# Patient Record
Sex: Male | Born: 1955 | Race: White | Hispanic: No | Marital: Married | Smoking: Former smoker
Health system: Southern US, Community
[De-identification: ages and names within clinical notes are randomized; demographics above are authoritative.]

## PROBLEM LIST (undated history)

## (undated) DIAGNOSIS — E785 Hyperlipidemia, unspecified: Secondary | ICD-10-CM

## (undated) DIAGNOSIS — G4730 Sleep apnea, unspecified: Secondary | ICD-10-CM

## (undated) DIAGNOSIS — F32A Depression, unspecified: Secondary | ICD-10-CM

## (undated) DIAGNOSIS — E039 Hypothyroidism, unspecified: Secondary | ICD-10-CM

## (undated) DIAGNOSIS — R55 Syncope and collapse: Secondary | ICD-10-CM

## (undated) DIAGNOSIS — M1990 Unspecified osteoarthritis, unspecified site: Secondary | ICD-10-CM

## (undated) DIAGNOSIS — R011 Cardiac murmur, unspecified: Secondary | ICD-10-CM

## (undated) DIAGNOSIS — R51 Headache: Secondary | ICD-10-CM

## (undated) DIAGNOSIS — Z8489 Family history of other specified conditions: Secondary | ICD-10-CM

## (undated) DIAGNOSIS — K219 Gastro-esophageal reflux disease without esophagitis: Secondary | ICD-10-CM

## (undated) DIAGNOSIS — E669 Obesity, unspecified: Secondary | ICD-10-CM

## (undated) DIAGNOSIS — R0789 Other chest pain: Secondary | ICD-10-CM

## (undated) DIAGNOSIS — I10 Essential (primary) hypertension: Secondary | ICD-10-CM

## (undated) DIAGNOSIS — F419 Anxiety disorder, unspecified: Secondary | ICD-10-CM

## (undated) DIAGNOSIS — Z9889 Other specified postprocedural states: Secondary | ICD-10-CM

## (undated) DIAGNOSIS — F329 Major depressive disorder, single episode, unspecified: Secondary | ICD-10-CM

## (undated) DIAGNOSIS — Z8711 Personal history of peptic ulcer disease: Secondary | ICD-10-CM

## (undated) DIAGNOSIS — I2510 Atherosclerotic heart disease of native coronary artery without angina pectoris: Secondary | ICD-10-CM

## (undated) DIAGNOSIS — D649 Anemia, unspecified: Secondary | ICD-10-CM

## (undated) DIAGNOSIS — R112 Nausea with vomiting, unspecified: Secondary | ICD-10-CM

## (undated) HISTORY — DX: Depression, unspecified: F32.A

## (undated) HISTORY — DX: Headache: R51

## (undated) HISTORY — PX: VASECTOMY: SHX75

## (undated) HISTORY — DX: Syncope and collapse: R55

## (undated) HISTORY — DX: Hyperlipidemia, unspecified: E78.5

## (undated) HISTORY — DX: Other chest pain: R07.89

## (undated) HISTORY — DX: Personal history of peptic ulcer disease: Z87.11

## (undated) HISTORY — DX: Major depressive disorder, single episode, unspecified: F32.9

## (undated) HISTORY — DX: Obesity, unspecified: E66.9

## (undated) HISTORY — PX: NECK SURGERY: SHX720

## (undated) HISTORY — PX: OTHER SURGICAL HISTORY: SHX169

## (undated) HISTORY — DX: Essential (primary) hypertension: I10

## (undated) HISTORY — PX: MOUTH SURGERY: SHX715

---

## 1998-11-05 ENCOUNTER — Ambulatory Visit (HOSPITAL_COMMUNITY): Admission: RE | Admit: 1998-11-05 | Discharge: 1998-11-05 | Payer: Self-pay | Admitting: Internal Medicine

## 2003-01-30 ENCOUNTER — Encounter: Payer: Self-pay | Admitting: Neurosurgery

## 2003-01-30 ENCOUNTER — Observation Stay (HOSPITAL_COMMUNITY): Admission: RE | Admit: 2003-01-30 | Discharge: 2003-01-31 | Payer: Self-pay | Admitting: Neurosurgery

## 2003-10-16 ENCOUNTER — Ambulatory Visit (HOSPITAL_COMMUNITY): Admission: RE | Admit: 2003-10-16 | Discharge: 2003-10-16 | Payer: Self-pay | Admitting: Internal Medicine

## 2004-04-12 ENCOUNTER — Encounter: Admission: RE | Admit: 2004-04-12 | Discharge: 2004-04-12 | Payer: Self-pay | Admitting: Internal Medicine

## 2004-04-22 ENCOUNTER — Ambulatory Visit (HOSPITAL_BASED_OUTPATIENT_CLINIC_OR_DEPARTMENT_OTHER): Admission: RE | Admit: 2004-04-22 | Discharge: 2004-04-22 | Payer: Self-pay | Admitting: Surgery

## 2004-04-22 ENCOUNTER — Ambulatory Visit (HOSPITAL_COMMUNITY): Admission: RE | Admit: 2004-04-22 | Discharge: 2004-04-22 | Payer: Self-pay | Admitting: Surgery

## 2005-10-12 ENCOUNTER — Ambulatory Visit (HOSPITAL_COMMUNITY): Admission: RE | Admit: 2005-10-12 | Discharge: 2005-10-12 | Payer: Self-pay | Admitting: Internal Medicine

## 2006-04-30 ENCOUNTER — Ambulatory Visit (HOSPITAL_COMMUNITY): Admission: RE | Admit: 2006-04-30 | Discharge: 2006-04-30 | Payer: Self-pay | Admitting: Orthopaedic Surgery

## 2008-06-22 LAB — CONVERTED CEMR LAB
BUN: 14 mg/dL (ref 6–23)
CO2: 29 meq/L (ref 19–32)
Chloride: 103 meq/L (ref 96–112)
Eosinophils Absolute: 0.1 10*3/uL (ref 0.0–0.7)
Eosinophils Relative: 2.2 % (ref 0.0–5.0)
GFR calc non Af Amer: 94 mL/min
Glucose, Bld: 128 mg/dL — ABNORMAL HIGH (ref 70–99)
INR: 1.1 — ABNORMAL HIGH (ref 0.8–1.0)
Lymphocytes Relative: 25.6 % (ref 12.0–46.0)
Monocytes Relative: 8.2 % (ref 3.0–12.0)
Neutrophils Relative %: 63.3 % (ref 43.0–77.0)
Platelets: 221 10*3/uL (ref 150–400)
Potassium: 3.7 meq/L (ref 3.5–5.1)
Prothrombin Time: 12.8 s (ref 10.9–13.3)
WBC: 6.5 10*3/uL (ref 4.5–10.5)

## 2008-06-26 ENCOUNTER — Ambulatory Visit: Payer: Self-pay | Admitting: Internal Medicine

## 2008-06-27 ENCOUNTER — Inpatient Hospital Stay (HOSPITAL_BASED_OUTPATIENT_CLINIC_OR_DEPARTMENT_OTHER): Admission: RE | Admit: 2008-06-27 | Discharge: 2008-06-27 | Payer: Self-pay | Admitting: Internal Medicine

## 2008-06-27 ENCOUNTER — Ambulatory Visit: Payer: Self-pay | Admitting: Internal Medicine

## 2008-07-12 ENCOUNTER — Ambulatory Visit: Payer: Self-pay | Admitting: Internal Medicine

## 2008-07-12 DIAGNOSIS — R079 Chest pain, unspecified: Secondary | ICD-10-CM | POA: Insufficient documentation

## 2008-07-12 DIAGNOSIS — I10 Essential (primary) hypertension: Secondary | ICD-10-CM | POA: Insufficient documentation

## 2008-07-12 DIAGNOSIS — E785 Hyperlipidemia, unspecified: Secondary | ICD-10-CM | POA: Insufficient documentation

## 2008-09-14 ENCOUNTER — Ambulatory Visit: Payer: Self-pay | Admitting: Internal Medicine

## 2008-09-19 ENCOUNTER — Ambulatory Visit: Payer: Self-pay | Admitting: Internal Medicine

## 2008-09-19 LAB — CONVERTED CEMR LAB
BUN: 16 mg/dL (ref 6–23)
Chloride: 105 meq/L (ref 96–112)
GFR calc non Af Amer: 83 mL/min
Potassium: 3.9 meq/L (ref 3.5–5.1)

## 2008-09-28 ENCOUNTER — Ambulatory Visit: Payer: Self-pay | Admitting: Pulmonary Disease

## 2008-09-28 DIAGNOSIS — G4733 Obstructive sleep apnea (adult) (pediatric): Secondary | ICD-10-CM | POA: Insufficient documentation

## 2008-10-03 ENCOUNTER — Ambulatory Visit (HOSPITAL_BASED_OUTPATIENT_CLINIC_OR_DEPARTMENT_OTHER): Admission: RE | Admit: 2008-10-03 | Discharge: 2008-10-03 | Payer: Self-pay | Admitting: Pulmonary Disease

## 2008-10-03 ENCOUNTER — Encounter: Payer: Self-pay | Admitting: Pulmonary Disease

## 2008-10-20 ENCOUNTER — Ambulatory Visit: Payer: Self-pay | Admitting: Pulmonary Disease

## 2008-10-22 ENCOUNTER — Telehealth (INDEPENDENT_AMBULATORY_CARE_PROVIDER_SITE_OTHER): Payer: Self-pay | Admitting: *Deleted

## 2008-10-23 ENCOUNTER — Encounter (INDEPENDENT_AMBULATORY_CARE_PROVIDER_SITE_OTHER): Payer: Self-pay | Admitting: *Deleted

## 2008-10-26 ENCOUNTER — Telehealth (INDEPENDENT_AMBULATORY_CARE_PROVIDER_SITE_OTHER): Payer: Self-pay | Admitting: *Deleted

## 2008-10-29 DIAGNOSIS — R55 Syncope and collapse: Secondary | ICD-10-CM | POA: Insufficient documentation

## 2008-11-02 ENCOUNTER — Encounter: Payer: Self-pay | Admitting: Internal Medicine

## 2008-11-02 ENCOUNTER — Ambulatory Visit: Payer: Self-pay | Admitting: Internal Medicine

## 2008-12-03 ENCOUNTER — Ambulatory Visit: Payer: Self-pay | Admitting: Pulmonary Disease

## 2008-12-11 ENCOUNTER — Ambulatory Visit: Payer: Self-pay | Admitting: Internal Medicine

## 2009-01-10 ENCOUNTER — Ambulatory Visit: Payer: Self-pay | Admitting: Pulmonary Disease

## 2009-02-01 ENCOUNTER — Encounter: Payer: Self-pay | Admitting: Pulmonary Disease

## 2009-05-31 ENCOUNTER — Telehealth (INDEPENDENT_AMBULATORY_CARE_PROVIDER_SITE_OTHER): Payer: Self-pay | Admitting: *Deleted

## 2009-06-12 ENCOUNTER — Telehealth (INDEPENDENT_AMBULATORY_CARE_PROVIDER_SITE_OTHER): Payer: Self-pay | Admitting: *Deleted

## 2009-06-25 ENCOUNTER — Telehealth (INDEPENDENT_AMBULATORY_CARE_PROVIDER_SITE_OTHER): Payer: Self-pay | Admitting: *Deleted

## 2009-07-09 ENCOUNTER — Ambulatory Visit: Payer: Self-pay | Admitting: Pulmonary Disease

## 2009-07-15 ENCOUNTER — Telehealth (INDEPENDENT_AMBULATORY_CARE_PROVIDER_SITE_OTHER): Payer: Self-pay | Admitting: *Deleted

## 2009-07-30 ENCOUNTER — Ambulatory Visit: Payer: Self-pay | Admitting: Internal Medicine

## 2010-05-20 ENCOUNTER — Ambulatory Visit: Payer: Self-pay | Admitting: Internal Medicine

## 2010-09-09 NOTE — Assessment & Plan Note (Signed)
Summary: f15m  Medications Added GLIMEPIRIDE 4 MG TABS (GLIMEPIRIDE) two times a day LISINOPRIL 40 MG TABS (LISINOPRIL) Take one tablet by mouth daily      Allergies Added:   Visit Type:  Follow-up Referring Provider:  Dr. Milas Kocher Primary Provider:  Dr. Elmore Guise  CC:  chest pain on and off .  History of Present Illness: Tony Morrison is a 55 y/o with obesity, HTN, HL, diabetes, OSA and CP with normal cors on cath 11/09. We have been helping with his BP management. Prior to his last visit he had a severe syncopal episode in the setting of hypotension. We stopped his diuretic and decreased his lisinopril. He returns for routine f/u.  Feeling well. Following with Dr. Chilton Si regularly.  Occasional CP. About once every over month. Typically brief. Seems to be associated with stress.   Blood sugars  have been high. Amaryl no increased to bid. BP fairly well controlled. Occasionally dizzy when he stands up.  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 Compliant with meds.   Current Medications (verified): 1)  Crestor 5 Mg Tabs (Rosuvastatin Calcium) .Marland Kitchen.. 1 Once Daily 2)  Prozac 40 Mg Caps (Fluoxetine Hcl) .Marland Kitchen.. 1 Once Daily 3)  Glimepiride 4 Mg Tabs (Glimepiride) .... Two Times A Day 4)  Pantoprazole Sodium 40 Mg Tbec (Pantoprazole Sodium) .Marland Kitchen.. 1 Once Daily 5)  Metformin Hcl 1000 Mg Tabs (Metformin Hcl) .Marland Kitchen.. 1 Two Times A Day 6)  Aspirin Adult Low Strength 81 Mg Tbec (Aspirin) .Marland Kitchen.. 1 Once Daily 7)  Carvedilol 25 Mg Tabs (Carvedilol) .... Take One Tablet By Mouth Twice Daily. 8)  Lisinopril 20 Mg Tabs (Lisinopril) .... Take One Tablet By Mouth Daily  Allergies (verified): 1)  ! Codeine 2)  ! Prednisone  Past History:  Past Medical History: Last updated: 12/11/2008  1. Chest pressure        -cardiac cath; minimal non-obs CAD and normal EF 11/09  2. Diabetes x5 years.   3. Obesity.   4. Hypertension.       --s/p syncopal episode with facial trauma  due to orthostasis 3/10. Diuretic held.  5. Hyperlipidemia.   6. Depression.   7. Glaucoma.   8. History of peptic ulcer disease.   9. Vascular headaches.  10. Syncopal episode likely due to orthostatic hypotension 3/10  Review of Systems       As per HPI and past medical history; otherwise all systems negative.   Vital Signs:  Patient profile:   55 year old male Height:      68 inches Weight:      256 pounds BMI:     39.07 Pulse rate:   63 / minute BP sitting:   140 / 78  (left arm) Cuff size:   regular  Vitals Entered By: Hardin Negus, RMA (May 20, 2010 8:44 AM)  Physical Exam  General:  Well appearing. no resp difficulty HEENT: normal Neck: supple. no JVD. Carotids 2+ bilat; no bruits.  Cor: PMI nondisplaced. Regular rate & rhythm. No rubs, gallops, murmur. Lungs: clear Abdomen: obese soft, nontender, nondistended. Good bowel sounds. Extremities: no cyanosis, clubbing, rash, edema Neuro: alert & orientedx3, cranial nerves grossly intact. moves all 4 extremities w/o difficulty. affect pleasant    Impression & Recommendations:  Problem # 1:  HYPERTENSION, BENIGN (ICD-401.1) BP wmildly elevated. Increase lisinopril to 40 once daily. Follow-up with Dr. Chilton Si. Encouraged him to get routine exercise.  Problem # 2:  CHEST PAIN-UNSPECIFIED (ICD-786.50) Doubt cardiac  given results of cath.    Other Orders: EKG w/ Interpretation (93000)  CHF Assessment/Plan:      The patient's current weight is 256 pounds.  His previous weight was 256 pounds.     Patient Instructions: 1)  Increase Lisinopril to 40mg  daily 2)  Your physician wants you to follow-up in:  9 months.  You will receive a reminder letter in the mail two months in advance. If you don't receive a letter, please call our office to schedule the follow-up appointment. Prescriptions: LISINOPRIL 40 MG TABS (LISINOPRIL) Take one tablet by mouth daily  #30 x 12   Entered by:   Meredith Staggers, RN   Authorized  by:   Dolores Patty, MD, [facility]   Signed by:   Meredith Staggers, RN on 05/20/2010   Method used:   Electronically to        [phone][facility] (retail)       [address]       [place], [place]  [address]       Ph: [phone] or [phone]       Fax: [phone]   RxID:   [id number]

## 2010-12-23 NOTE — Assessment & Plan Note (Signed)
[facility]                            CARDIOLOGY OFFICE NOTE   Tony Morrison, Tony Morrison                   MRN:          [id number]  DATE:09/14/2008                            DOB:          [birth year]    PRIMARY CARE PHYSICIAN:  Erskine Speed, MD   INTERVAL HISTORY:  Tony Morrison is a pleasant 55 year old male with a history  of diabetes, hypertension, hyperlipidemia, morbid obesity, depression,  and chest pain.  He had a cardiac catheterization in November 2009 which  showed minimal nonobstructive coronary artery disease with normal LV  function and widely patent renal arteries bilaterally.   He comes today for followup.  Since we last saw him, he has been working  with [name].  She initially started him on Altace 5 mg a day to  help with his blood pressure, but it was still elevated, so she titrated  him to 10 a day.  Blood pressure did not change much, so she actually  stopped the Altace and switched his metoprolol over to Coreg 25 b.i.d.  He brings in a list of his blood pressures and notices that most of his  systolics are still in the 140-150 range with occasional 160 readings.  Functionally, he feels pretty good.  He denies any chest pain.  He does  still have occasional shortness of breath, but nothing new.  No  orthopnea.  No PND.  He does note that he is snoring quite heavily.   CURRENT MEDICATIONS:  1. Crestor 5 a day.  2. Prozac 40 a day.  3. Glimepiride 2 a day.  4. Pantoprazole 40 a day.  5. Metformin 1000 b.i.d.  6. Aspirin 81 a day.  7. Carvedilol 25 b.i.d.   PHYSICAL EXAMINATION:  GENERAL:  He is no acute distress.  He ambulates  around in the clinic without any respiratory difficulty.  Affect is  pleasant.  VITAL SIGNS:  Blood pressure is 148/80, heart rate 65.  HEENT:  Normal except for his airway which is at least a class III.  NECK:  Thick and supple.  No obvious JVD.  Carotids are 2+ bilaterally  without any bruits.   There is no lymphadenopathy or thyromegaly.  CARDIAC:  PMI is nondisplaced.  Regular rate and rhythm.  No murmurs,  rubs, or gallops.  LUNGS:  Clear.  ABDOMEN:  Markedly obese, nontender, nondistended.  No obvious  hepatosplenomegaly.  No bruits.  No masses.  However, the exam is  limited.  EXTREMITIES:  Warm with no cyanosis, clubbing, or edema.  No rash.  NEUROLOGIC:  Alert and oriented x3.  Cranial nerves II through XII are  grossly intact, and moves all 4 extremities without difficulty.  PSYCHIATRIC:  Affect is pleasant and is not somewhat nervous.   EKG shows sinus rhythm at a rate of 62.  No ST-T wave abnormalities.   ASSESSMENT AND PLAN:  1. Hypertension.  Blood pressure remains elevated.  Given his      diabetes, I think ACE inhibitors are a very good choice.  We will      start him on lisinopril/hydrochlorothiazide 20/25 half tablet  b.i.d.  We will check a BMET next week to make sure his potassium      is stable and see how he does with this.  I also think that he      probably has severe sleep apnea and this is likely contributing to      his hypertension as well.  We will refer him to Pulmonary for      further evaluation.  2. Chest pain.  This is resolved.  Cardiac catheterization look quite      good.  3. Hyperlipidemia.  This is followed by Dr. Chilton Si.  Given his      diabetes, goal LDL is less than 70.   DISPOSITION:  He will follow up with Dr. Chilton Si in a month.  We will see  him back in 3 months to check in how he is doing.     Bevelyn Buckles. Bensimhon, MD  Electronically Signed    DRB/MedQ  DD: 09/14/2008  DT: 09/15/2008  Job #: [id number]

## 2010-12-23 NOTE — Procedures (Signed)
NAME:  Tony Morrison, Tony Morrison NO.:  [id number]   MEDICAL RECORD NO.:  [id number]          PATIENT TYPE:  OUT   LOCATION:  SLEEP CENTER                 FACILITY:  [facility]   PHYSICIAN:  Barbaraann Share, MD,FCCPDATE OF BIRTH:  05/22/1956   DATE OF STUDY:  10/03/2008                            NOCTURNAL POLYSOMNOGRAM   REFERRING PHYSICIAN:  Barbaraann Share, MD,FCCP   INDICATION OF STUDY:  Hypersomnia with sleep apnea.   EPWORTH SCORE:  5.   SLEEP ARCHITECTURE:  The patient had a total sleep time of 291 minutes  with no slow wave sleep and only 53 minutes of REM.  Sleep onset latency  was prolonged at 57 minutes, and REM onset was very prolonged at 230  minutes.  Sleep efficiency was moderately decreased at 69%.   RESPIRATORY DATA:  The patient was found to have 1 apnea and 19  hypopneas, giving him an apnea/hypopnea index of 4 events per hour.  He  was also found to have 33 respiratory effort related arousals, giving  him a respiratory disturbance index of 11 events per hour.  The events  were more severe during REM and also in the supine position.  There was  moderate to loud snoring noted throughout.   OXYGEN DATA:  The patient was found to have O2 desaturation as low as  84% with his obstructive events.   CARDIAC:  Rare PAC, but no clinically significant arrhythmias seen.   MOVEMENT/PARASOMNIA:  The patient had small numbers of periodic leg  movements without significant arousal or awakening.  There was no  abnormal behavior seen.   IMPRESSION/RECOMMENDATIONS:  1. Mild obstructive sleep apnea/hypopnea syndrome with an AHI of 4      events per hour, and a RDI of 11 events per hour.  It should be      noted the patient had very little slow wave sleep or REM, and      therefore his degree of sleep apnea may be underestimated by the      study.  Treatment for this degree of sleep apnea can include a      trial of weight loss alone, upper airway surgery, dental  appliance,      and also CPAP.  Clinical correlation is      suggested.  2. Rare premature atrial contraction noted, but no clinically      significant arrhythmias seen.       Barbaraann Share, MD,FCCP  Diplomate, American Board of Sleep  Medicine  Electronically Signed     KMC/MEDQ  D:  10/20/2008 14:12:30  T:  10/21/2008 00:16:05  Job:  [id number]

## 2010-12-23 NOTE — Assessment & Plan Note (Signed)
[facility]                                 ON-CALL NOTE   Tony, Morrison                   MRN:          [id number]  DATE:10/28/2008                            DOB:          06/08/1956    TELEPHONE NUMBER:  [phone].   PRIMARY CARDIOLOGIST:  Bevelyn Buckles. Bensimhon, MD   ATTENDING CARDIOLOGIST:  Doylene Canning. Ladona Ridgel, MD   I received a phone call from [name] at approximately 2 o'clock on  Sunday, October 28, 2008, stating that the day prior on Saturday, he had a  syncopal episode.  He said he was sitting in his recliner and stood up  and passed out after doing so, landing face forward, cut the bridge of  his nose, and broke his glasses.  The patient since that time has had a  little bit of a headache and he called today to let me know that this  happened and that he still continues to have the headache.  He said that  he has no further dizziness.  He has had no further syncopal episodes  since that one.  The patient is diabetic and he did check his blood  glucose and it was running around 160.  He also checked his blood  pressure afterwards and he said it was running about 136 systolic.   The patient denied any chest pain or palpitations.  He says that he has  been recently placed on a new medication in February by Dr. Gala Romney.  Review of records shows that the patient has been placed on  lisinopril/hydrochlorothiazide 20/25 one-half tablet b.i.d.  The patient  has been advised not to take the lisinopril/HCTZ until following up with  Dr. Gala Romney.  I have left a message with the office in order to have  them give him back a call after arranging a followup appointment with  Dr. Gala Romney sometime this week to evaluate and adjust his medications  as necessary.   I have also explained to the patient if he has another syncopal episode,  has dizziness, blurred vision, severe headache, that he is to call EMS  and be brought to the hospital for  evaluation.  He is NOT on any  anticoagulation to include Coumadin or Plavix; however, if the symptoms  worsen and he continues to have syncope, I have advised him to be seen  in the emergency room for further evaluation and to call EMS and not  drive himself.  He verbalizes understanding.     Bettey Mare. Lyman Bishop, NP  Electronically Signed      Bevelyn Buckles. Bensimhon, MD  Electronically Signed   KML/MedQ  DD: 10/28/2008  DT: 10/29/2008  Job #: [id number]

## 2010-12-23 NOTE — Assessment & Plan Note (Signed)
[facility]                            CARDIOLOGY OFFICE NOTE   DMAURI, HUSSEIN                   MRN:          [id number]  DATE:06/26/2008                            DOB:          1956/01/16    REFERRING PHYSICIAN:  Erskine Speed, M.D.   REASON FOR REFERRAL:  Chest pain.   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  He has a history of  diabetes, hypertension, hyperlipidemia, obesity and depression.  He does  not have any known coronary artery disease.  He has had several  treadmill tests previously, most recently about 2 years ago with a test  for chest pressure.  This was reportedly negative.   He states that approximately a week ago he had an episode of chest  pressure which he did not think too much of.  On Friday he was sitting  in his recliner, developed chest pressure and pain and soreness in his  left arm.  He felt clammy and very fatigued.  This lasted several hours.  It finally resolved spontaneously.  He has not had a recurrent episode.   He says he typically walks his dog about a quarter or half mile a day.  He has noticed over the past few weeks he is a little bit more short of  breath with this but does not get exertional chest pressure.   REVIEW OF SYSTEMS:  He denies any palpitations, no syncope, no  presyncope, no focal neurologic symptoms, no bleeding, no claudication,  no heart failure symptoms.   The remainder of review of systems is negative except for HPI and  problem list plus heavy snoring.   PROBLEM LIST:  1. Diabetes x5 years.  2. Obesity.  3. Hypertension.  4. Hyperlipidemia.  5. Depression.  6. Glaucoma.  7. History of peptic ulcer disease.  8. Vascular headaches.   PAST SURGICAL HISTORY:  1. Status post C5-C6 and neck surgery.  2. Status post left knee surgery x2.  3. Status post vasectomy.   CURRENT MEDICATIONS:  1.  Metformin 1000 b.i.d.  2. Crestor 5 mg nightly.  3. Nexium 40 b.i.d.  4. Aspirin 81 a day.  5. Glimepiride 1/2 tablet daily.  6. Toprol XL 100 mg a day.  7. Prozac 20 mg a day.   ALLERGIES:  CODEINE.   SOCIAL HISTORY:  He is a Animator.  He is married  with one child.  History of tobacco, quit in 1984.  No alcohol.   FAMILY HISTORY:  Notable for significant coronary artery disease.  Mother is alive but has coronary artery disease with a CABG at age 69.  Father died at age 91 due to lung cancer.  He had a bypass surgery in  his 81s.  He has 2 sisters and 2 brothers who do not have documented  coronary artery disease.   PHYSICAL EXAM:  He is no acute distress, he ambulates around the clinic  without respiratory difficulty.  Blood pressure initially was taken at  118/74, on my manual recheck 176/92, heart rate 65,  weight is 262.  HEENT:  Normal.  NECK:  Supple and very thick, no obvious JVD though it is quite hard to  tell, carotids are 2+ bilaterally without any bruits.  There is no  lymphadenopathy or thyromegaly.  CARDIAC:  PMI is not palpable.  He is regular with no obvious murmurs,  rubs or gallops.  LUNGS:  Clear.  ABDOMEN:  Obese, nontender, no obvious hepatosplenomegaly, no bruits, no  masses.  EXTREMITIES:  Warm with no cyanosis, clubbing or edema.  DP pulses are  not palpable bilaterally, however, his PT pulses are strong 2+  bilaterally.  There is no rash.  NEURO:  Alert and oriented x3.  Cranial nerves II-XII are intact.  Moves  all four extremities without difficulty.  Affect is pleasant.   EKG shows sinus bradycardia with no ST-T wave abnormalities.   ASSESSMENT AND PLAN:  1. Chest pain, this is concerning for angina.  We have talked about      the diagnostic possibilities including stress testing and cardiac      catheterization, given his risk factors and symptoms I strongly      favor cardiac catheterization.  He agrees.  I explained the  risks      and indications.  We will proceed tomorrow morning in the      outpatient catheterization lab.  2. Hypertension, blood pressure is elevated.  This may be due to      stress of having to undergo cath but I suspect that he will need      further medical therapy given his history of diabetes.  Would      consider starting an angiotensin converting enzyme inhibitor after      his catheterization.  3. Hyperlipidemia, given his diabetes goal LDL is less than 70,      continue statin.  This is followed by Dr. Chilton Si.  4. Heavy snoring, I suspect he is at high risk for sleep apnea.  We      did discuss possibility of following up with pulmonary for a sleep      study.  5. Obesity, we discussed the need for exercise and dieting.  We will      follow this up at a later date.   DISPOSITION:  Cardiac catheterization in the outpatient lab tomorrow.     Bevelyn Buckles. Bensimhon, MD  Electronically Signed    DRB/MedQ  DD: 06/26/2008  DT: 06/26/2008  Job #: [id number]   cc:   Erskine Speed, M.D.

## 2010-12-23 NOTE — Assessment & Plan Note (Signed)
[facility]                            CARDIOLOGY OFFICE NOTE   Tony Morrison, Tony Morrison                   MRN:          [id number]  DATE:07/12/2008                            DOB:          [birth year]    PRIMARY CARDIOLOGIST:  Bevelyn Buckles. Bensimhon, MD   This is a 55 year old married white male patient who has a history of  diabetes, hypertension, hyperlipidemia, obesity, depression, and strong  family history of coronary disease.  He developed chest pressure into  his left arm while sitting in a recliner.  Cardiac catheterization was  recommended and performed on June 27, 2008.  This revealed minimal  nonobstructive coronary artery disease with normal LV function, EF 65%,  and patent renal arteries bilaterally.  Dr. Gala Romney did not feel his  chest pain was cardiac in nature.  He did check a D-dimer which was low  and recommended risk factor management.   Today in the office, the patient states he has not had any further chest  pain and has been doing quite well.  His blood pressure is elevated  today and was when he was seen before.  I talked to him about importance  of diet and exercise.  He says he is not sure he can fit aggressive  exercise program into his day, so would prefer to get the medication at  this point.   CURRENT MEDICATIONS:  1. Crestor 5 mg daily.  2. Prozac 40 mg daily.  3. Glimepiride 2 mg daily.  4. Pantoprazole 40 mg daily.  5. Metoprolol 100 mg daily.  6. Metformin 1000 mg b.i.d.  7. Aspirin 81 mg daily.   PHYSICAL EXAMINATION:  This is a pleasant 55 year old white male in no  acute distress.  Blood pressure 152/90, pulse 60, weight 257.  Neck is  without JVD, HJR, bruit, or thyroid enlargement.  Lungs are clear  anterior, posterior, and lateral.  Heart regular rate and rhythm at 60  beats per minute.  Normal S1 and S2.  Positive 1/6 systolic ejection  murmur at the left sternal border.  Abdomen is obese.   Normoactive bowel  sounds are heard throughout.  Right groin is stable without hematoma or  hemorrhage.  Lower extremities without cyanosis, clubbing, or edema.   IMPRESSION:  1. Chest pain, felt to be noncardiac.  2. Nonobstructive coronary artery disease and normal left ventricular      function on cath on June 27, 2008.  3. Hypertension, uncontrolled.  4. Diabetes.  5. Obesity.  6. Hyperlipidemia, treated.  7. History of depression.  8. History of peptic ulcer disease.  9. Glaucoma.  10.Vascular headaches.   PLAN:  We will prescribe an ACE inhibitor for his hypertension.  I have  prescribed Altace 5 mg daily to [facility] [address].  He is to monitor his  blood pressures daily and call in 2 weeks with his reading.  This can be  titrated up if needed.  He has an appointment to see Dr. Gala Romney back  in February.      [name], PA-C  Electronically Signed      Smitty Cords  R. Juanda Chance, MD, [facility]  Electronically Signed   ML/MedQ  DD: 07/12/2008  DT: 07/12/2008  Job #: [id number]   cc:   Erskine Speed, M.D.

## 2010-12-23 NOTE — Cardiovascular Report (Signed)
NAME:  KENTO, GOSSMAN NO.:  [id number]   MEDICAL RECORD NO.:  [id number]          PATIENT TYPE:  OIB   LOCATION:  NA                           FACILITY:  [facility]   PHYSICIAN:  Bevelyn Buckles. Bensimhon, MDDATE OF BIRTH:  12-Nov-1955   DATE OF PROCEDURE:  06/27/2008  DATE OF DISCHARGE:                            CARDIAC CATHETERIZATION   PRIMARY CARE PHYSICIAN:  Erskine Speed, MD   PATIENT IDENTIFICATION:  Tony Morrison is a delightful 55 year old male with  multiple cardiac risk factors including hypertension, hyperlipidemia,  obesity, and diabetes who presented with chest pain radiating to his  left arm as well as decreased exercise tolerance.  He is brought for  catheterization in the Outpatient Catheterization Laboratory.   PROCEDURES PERFORMED:  1. Selective coronary angiography.  2. Left heart cath.  3. Left ventriculogram.  4. Abdominal aortogram.   DESCRIPTION OF PROCEDURE:  The risks and indication were explained.  Consent was signed and placed in the chart.  A 4-French arterial sheath  was placed in the right femoral artery using a modified Seldinger  technique.  Standard catheters including JL4, 3D-RCA, and angled pigtail  were used.  All catheter exchanges made over wire.  There were no  apparent complications.   HEMODYNAMIC RESULTS:  Central aortic pressure 117/77 with mean of 94.  LV pressure 117/24.  LVEDP of 15.  There is no aortic stenosis.   Left main was normal.   LAD was a long vessel wrapping the apex gave off 1 large diagonal, a  small distal diagonal.  It had a minimal 20% plaque in the proximal  through the midsection of the LAD.   Left circumflex is a very large dominant system gave off a large ramus  branch, a moderate-sized OM-1, a small OM-2, 3 posterolaterals, and a  PDA and is angiographically normal.   Right coronary artery was a small nondominant vessel.   Left ventriculogram done in the RAO position showed an EF of 65% with no  regional wall motion abnormalities.  No significant mitral  regurgitation.   Abdominal aortogram showed a widely patent renal arteries bilaterally  with no significant abdominal aortic plaquing.   ASSESSMENT:  1. Minimal nonobstructive coronary artery disease as described above.  2. Normal left ventricular function.  3. Patent renal arteries bilaterally.   PLAN:  As discussion.  Based on his cardiac catheterization, I do not  think his chest pain is cardiac in nature.  We will check a D-dimer to  rule out the possibility of pulmonary embolus, but I think this is also  of low probability.  I will continue with aggressive risk factor  management.      Bevelyn Buckles. Bensimhon, MD  Electronically Signed     DRB/MEDQ  D:  06/27/2008  T:  06/27/2008  Job:  [id number]   cc:   Erskine Speed, M.D.

## 2010-12-26 NOTE — Op Note (Signed)
NAME:  CUB, GIANNINI NO.:  [id number]   MEDICAL RECORD NO.:  [id number]                   PATIENT TYPE:  AMB   LOCATION:  DSC                                  FACILITY:  [facility]   PHYSICIAN:  Vanita Panda. Magnus Ivan, M.D.       DATE OF BIRTH:  [birth year]   DATE OF PROCEDURE:  04/22/2004  DATE OF DISCHARGE:                                 OPERATIVE REPORT   PREOPERATIVE DIAGNOSIS:  Left knee medial meniscal tear.   POSTOPERATIVE DIAGNOSIS:  Left knee medial meniscal tear.   PROCEDURE:  Left knee arthroscopy with partial medial meniscectomy.   SURGEON:  Vanita Panda. Magnus Ivan, M.D.   ANESTHESIA:  General.   ESTIMATED BLOOD LOSS:  Minimal.   TOURNIQUET TIME:  47 minutes.   COMPLICATIONS:  None.   INDICATIONS FOR PROCEDURE:  Briefly, the patient is a 55 year old gentleman  with no history of knee pain until several weeks ago when he was playing  basketball with his son. He had a twisting injury to his knee and has had  knee pain on the medial aspect since then.  He continued to have medial  joint line tenderness and an MRI by his primary care physician.  This was  consistent with a mid substance medial meniscal tear which is a radial tear  as well as a possible posterior horn tear.  Due to the size of the tear, his  age and continued symptoms of locking and catching, it was recommended that  he undergo arthroscopy with assessment and possible debridement versus  repair.  The risks and benefits of this were explained to him and well  documented and he agreed to proceed with surgery.   DESCRIPTION OF PROCEDURE:  In the holding room the knee was shaved and he  was brought to the operating room and general anesthesia was obtained. A  tourniquet was placed around his thigh and then his leg was prepped and  draped with Duraprep and sterile drapes. This included a sterile stockinette  and Coban.  Anterior inferior incision was then made on the lateral  aspect  of his knee just lateral to the patellar tendon and the arthroscope was  inserted.  The diagnostic portion of the arthroscopy was undertaken  including a tour of the knee that started with a superior suprapatellar  space.  The patella and trochlear groove were assessed and found to be  without any kind of cartilage deficits.  There was no plica noted.  Assessment of the medial gutter found no loose bodies. The medial  compartment showed a small articular deficit on the most medial aspect of  the condyle. However, this appeared to have fibrocartilage growing over this  and measured about 5 mm in circumference. There was no loose area and this  appeared to not involve any articular aspect of the knee.  The medial  meniscus was assessed and found to have a large radial tear.  There was a  meniscal cyst  noted with this. The posterior horn was probed and found to be  intact.  The remainder of the medial femoral condyle and medial tibial  plateau were intact.  The anterior cruciate ligament and posterior cruciate  ligament were intact in the intracondylar area.  Finally, the lateral  compartment was assessed and found to have an intact cartilage on the  lateral and femoral condyle and the lateral plateau as well as the lateral  meniscus.  Next, an anterior inferior portal was made just medial to the  patellar tendon at the level of the medial meniscus.  The arthroscopic  shaver was inserted into the knee and then debridement was undertaken of the  medial meniscal tear.  Side biters were likewise used to debrided this tear.  A partial medial meniscectomy was then performed and completed. The knee was  put through a range of motion under arthroscopic guidance and there was  found to be no flap of tissue exposing to the joint or to the articular  aspect. The meniscus was again probed and found to be stable.  The knee was  then lavaged with several liters of normal saline solution and then  the  arthroscope was removed. A mixture of morphine and Sensorcaine was then  inserted into the knee and then local Xylocaine was used in the portals.  Each portal was closed with a single 4-0 nylon suture.  Sterile dressings  were then applied as well as an ice pack.  The patient was awakened,  extubated and taken to the recovery room in stable condition.                                               Vanita Panda. Magnus Ivan, M.D.    CYB/MEDQ  D:  04/22/2004  T:  04/22/2004  Job:  [id number]

## 2010-12-26 NOTE — Op Note (Signed)
NAME:  SHAQUEZ, GIKAS NO.:  [id number]   MEDICAL RECORD NO.:  [id number]                   PATIENT TYPE:  INP   LOCATION:  2860                                 FACILITY:  [facility]   PHYSICIAN:  Danae Orleans. Venetia Maxon, M.D.               DATE OF BIRTH:  [birth year]   DATE OF PROCEDURE:  01/30/2003  DATE OF DISCHARGE:                                 OPERATIVE REPORT   PREOPERATIVE DIAGNOSIS:  Herniated cervical disks with spondylosis,  degenerative disk disease, and cervical radiculopathy with cervical stenosis  C5-6 and C6-7 levels.   POSTOPERATIVE DIAGNOSIS:  Herniated cervical disks with spondylosis,  degenerative disk disease, and cervical radiculopathy with cervical stenosis  C5-6 and C6-7 levels.   PROCEDURE:  Anterior cervical decompression and fusion C5-6 and C6-7 levels  with allograft bone graft and anterior cervical plate.   SURGEON:  Danae Orleans. Venetia Maxon, M.D.   ASSISTANT:  Stefani Dama, M.D.   ANESTHESIA:  General endotracheal anesthesia.   ESTIMATED BLOOD LOSS:  Minimal.   COMPLICATIONS:  None.   DISPOSITION:  To recovery room.   INDICATIONS FOR PROCEDURE:  The patient is a 55 year old man with  excruciating left arm pain and triceps weakness with cervical spondylosis  and stenosis at the C5-6 and C6-7 levels. It was elected to take him to  surgery for anterior cervical decompression and fusion of these effected  levels.   DESCRIPTION OF PROCEDURE:  The patient was brought to the operating room.  Following the satisfactory and uncomplicated induction of general  endotracheal, and placement of intravenous lines, the patient was placed in  the supine position on the operating table.  His neck was placed in slight  extension.  He was placed in 10 pounds of Holter traction. His anterior neck  was then prepped and draped in the usual sterile fashion.  The area of plane  incision was infiltrated with 0.25% Marcaine after some lidocaine with  1:200,000 epinephrine. The incision was made on the lower neck creases in  the left side of the midline from the midline to the anterior border of the  sternocleidomastoid muscle and carried sharply the platysma layer.  Subplatysmal dissection was performed exposing the anterior border of the  sternocleidomastoid muscle using blunt dissection, the carotid sheath was  kept lateral, the trachea and esophagus were kept medial exposing the  anterior cervical spine.  A bent spinal needle was placed at what was  thought to be the C5-6 level.  Next, intraoperative x-ray was obtained. It  was not possible to visualize this level secondary to the patient's large  body habitus.  A second spinal needle was therefore placed at the C4-5 level  and this was confirmed on intraoperative x-ray, but the C5-6 level was not  visualized.  Subsequently the longus coli muscles were taken down from the  anterior cervical spine C5 through C7 levels using electrocautery and keel  elevator.  A 55 mm long shadowline retractor blades were placed on the cuffs  of the longus coli muscles and 60 mm blades were placed for up-down  retraction.  Subsequently the ventral osteophytes were removed from C5-6 and  C6-7 levels using Leksell rongeur and the interspaces were then incised with  a 15 blade and disk material was removed in a piecemeal fashion.  Endplates  were stripped of residual disk material and microscope was brought into the  field using the long disk space spreader.  Initially the C5-6 level was  operated on.  Large bony spurs were drilled down using a highspeed drill and  A2 bur under microscopic visualization.  The spinal cord and dura were then  decompressed and both neuroforamen were widely decompressed.  A 7 mm  cancellous bone graft was then placed after sizing with dummy sizers into  the interspace and countersunk appropriately.  Subsequently decompression  was then performed at the C6-7 level where  similarly calcified osteophytes  were identified and removed.  A large fragment of disk material was removed  overlying the C7 nerve root on the left and this resulted in significant  decompression of the C7 nerve root.  The right C7 nerve root was also  decompressed and hemostasis was assured with Gelfoam soaked in thrombin and  an 8 mm cancellous spacer bone graft was placed at this level.  A 44 mm  Trinica anterior cervical plate was then affixed to the anterior cervical  spine using 14 mm rare long screws two at C5, two at C6, and two at C7. All  screws had excellent purchase. Locking mechanisms were engaged.  A final x-  ray was not obtained because it was not felt that it would be possible to  visualize this construct because of the patient's large body habitus.  Hemostasis was assured and soft tissues were inspected and found to be in  good repair. The platysmal layer was then closed with 3-0 Vicryl sutures.  The skin edges were reapproximated with a running 4-0 Vicryl subcuticular  stitch. The wound was dressed with Dermabond. The patient was extubated in  the operating room and taken to the recovery room in satisfactory condition  having tolerated his operation well.  Needle, sponge, and instrument count  correct at the end of the case.                                               Danae Orleans. Venetia Maxon, M.D.    JDS/MEDQ  D:  01/30/2003  T:  01/31/2003  Job:  [id number]

## 2010-12-26 NOTE — Op Note (Signed)
NAME:  Tony Morrison, Tony Morrison NO.:  [id number]   MEDICAL RECORD NO.:  [id number]          PATIENT TYPE:  AMB   LOCATION:  SDS                          FACILITY:  [facility]   PHYSICIAN:  Vanita Panda. Magnus Ivan, M.D.DATE OF BIRTH:  May 12, 1956   DATE OF PROCEDURE:  04/30/2006  DATE OF DISCHARGE:                                 OPERATIVE REPORT   PREOPERATIVE DIAGNOSIS:  Left knee internal derangement with previous  partial medial meniscectomy.   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   PROCEDURE:  Left knee arthroscopy, debridement and partial medial  meniscectomy.   SURGEON:  Vanita Panda. Magnus Ivan, MD   ANESTHESIA:  General.   ANTIBIOTICS:  1 g IV Ancef.   BLOOD LOSS:  Minimal.   TOURNIQUET TIME:  30 minutes.   COMPLICATIONS:  None.   INDICATIONS:  Briefly Tony Morrison is a 55 year old who almost exactly 2  years ago I performed a left knee arthroscopy and he had a tear of the  medial meniscus of the white-white zone.  I was able to perform a meniscal  shaving with the partial medial meniscectomy of this tear.  Over the last  few months he developed continuing left the knee pain tried anti-  inflammatories and two injections at least in his knee and this has not  calmed down.  He was quite tender around the medial joint line especially  the posterior medial aspect of the knee and we got to the point where we  decided to proceed with arthroscopic surgery to better evaluate his knee,  given the increasing pain.  The risks and benefits of this were explained to  him and well understood and he agreed to proceed with surgery.   PROCEDURE:  After informed consent was obtained, Tony Morrison  was brought  to the operating room and placed supine on the operating table.  General  anesthesia was then obtained, a nonsterile tourniquet placed on his upper  left thigh and leg was prepped draped with DuraPrep and  sterile drapes  including sterile stockinette. With a lateral leg post and the bed raised,  the knee was flexed off the side of the table.  An anterior lateral portal  was then made just lateral to the patellar tendon at the level of the  inferior pole patella.  The scope was inserted.  A medial portal was then  made just above the medial meniscus on the anterior medial side and the  probe was inserted.  There was found to be medially continued degenerative  tearing of the medial meniscus in the medial compartment.  The cartilage  itself was found to be intact with only minimal areas of cartilage  irregularities.  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  An arthroscopic  shaver was inserted as well as up cutting basket forceps and I performed a  partial medial meniscectomy of most the remaining aspects of the post  posterior meniscus.  This was then probed further and found to be intact.  I  then allowed fluid to lavage the knee then all instrumentation was removed  as well as the fluid from the knee.  I then closed the two portal  sites with interrupted 4-0 nylon suture.  Xeroform was placed over these  wounds.  I then infiltrated the knee with a mixture of 8 mg of morphine as  well as clonidine and Marcaine.  A well-padded sterile dressing was applied  including a sterile Ace wrap.  The patient was awakened, extubated, taken to  recovery room in stable condition.           ______________________________  Vanita Panda. Magnus Ivan, M.D.     CYB/MEDQ  D:  04/30/2006  T:  05/03/2006  Job:  [id number]

## 2011-01-19 ENCOUNTER — Encounter: Payer: Self-pay | Admitting: Internal Medicine

## 2011-03-10 ENCOUNTER — Encounter: Payer: Self-pay | Admitting: Internal Medicine

## 2011-03-10 ENCOUNTER — Ambulatory Visit (INDEPENDENT_AMBULATORY_CARE_PROVIDER_SITE_OTHER): Payer: 59 | Admitting: Internal Medicine

## 2011-03-10 VITALS — BP 144/80 | HR 64 | Ht 68.0 in | Wt 250.0 lb

## 2011-03-10 DIAGNOSIS — I10 Essential (primary) hypertension: Secondary | ICD-10-CM

## 2011-03-10 MED ORDER — CARVEDILOL 25 MG PO TABS: 25.0000 mg | ORAL_TABLET | Freq: Two times a day (BID) | ORAL | Status: DC

## 2011-03-10 NOTE — Progress Notes (Signed)
HPI:  Tony Morrison is a 55 y/o with obesity, HTN, HL, diabetes, OSA and CP with normal cors on cath 11/09. We have been helping with his BP management. Prior to his last visit he had a severe syncopal episode in the setting of hypotension. We stopped his diuretic and decreased his lisinopril. However at last visit in October 2011 his BP was up and lisinopril increased to 40 qd.   He returns for routine f/u.   Feeling well. Following with Dr. Chilton Si regularly. HgBa1c has been elevated and he is working on that. Has lost about 10 pounds. Doing it through diet. 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 Not taking BPs at home. Compliant with meds.   ROS: All systems negative except as listed in HPI, PMH and Problem List.  Past Medical History  Diagnosis Date  . Chest pressure     cardiac cath; minimal non-obs CAD and normal EF 06/2008  . Diabetes mellitus     x5 years  . Obesity   . Hypertension     s/p syncopal episode with facial trauma due to orthostasis 10/2008. diuretic held  . Hyperlipidemia   . Depression   . Glaucoma   . History of peptic ulcer disease   . Headache     vascular headaches  . Syncopal episodes     likely due to orthostatic hypotension    Current Outpatient Prescriptions  Medication Sig Dispense Refill  . aspirin 81 MG tablet Take 81 mg by mouth daily.        . carvedilol (COREG) 25 MG tablet Take 25 mg by mouth 2 (two) times daily with a meal.        . FLUoxetine (PROZAC) 40 MG capsule Take 40 mg by mouth daily.        Marland Kitchen glimepiride (AMARYL) 4 MG tablet Take 4 mg by mouth 2 (two) times daily.        Marland Kitchen lisinopril (PRINIVIL,ZESTRIL) 40 MG tablet Take 40 mg by mouth daily.        . metFORMIN (GLUCOPHAGE) 1000 MG tablet Take 1,000 mg by mouth 3 (three) times daily after meals.       . pantoprazole (PROTONIX) 40 MG tablet Take 40 mg by mouth daily.        . rosuvastatin (CRESTOR) 5 MG tablet Take 5 mg by mouth daily.           PHYSICAL  EXAM: Filed Vitals:   03/10/11 0947  BP: 130/84  Pulse: 64   General:  Well appearing. No resp difficulty HEENT: normal Neck: supple. JVP flat. Carotids 2+ bilaterally; no bruits. No lymphadenopathy or thryomegaly appreciated. Cor: PMI normal. Regular rate & rhythm. No rubs, gallops or murmurs. Lungs: clear Abdomen: soft, nontender, nondistended. Good bowel sounds. Extremities: no cyanosis, clubbing, rash, edema Neuro: alert & orientedx3, cranial nerves grossly intact. Moves all 4 extremities w/o difficulty. Affect pleasant.    ECG: Sinus bradycardia 56 No ST-T wave abnormalities.     ASSESSMENT & PLAN:

## 2011-03-10 NOTE — Assessment & Plan Note (Signed)
BP mildly elevated here. Have asked him to follow BPs at home and contact me. iF SBP persistently > 140 would add amlodipine 2.5. Avoid diuretic as he had severe hypotension with this in past.

## 2011-03-10 NOTE — Patient Instructions (Signed)
Your physician has requested that you regularly monitor and record your blood pressure readings at home. Please use the same machine at the same time of day to check your readings and record them to bring to your follow-up visit.  Your physician wants you to follow-up in: 1 year.  You will receive a reminder letter in the mail two months in advance. If you don't receive a letter, please call our office to schedule the follow-up appointment.

## 2011-05-11 ENCOUNTER — Telehealth: Payer: Self-pay | Admitting: Internal Medicine

## 2011-05-11 MED ORDER — AMLODIPINE BESYLATE 5 MG PO TABS: 5.0000 mg | ORAL_TABLET | Freq: Every day | ORAL | Status: DC

## 2011-05-11 NOTE — Telephone Encounter (Signed)
Pt was to be started on Norvasc.  Has this been called into [facility] on [address] [place]?  Please call and advise pt.  Tony Morrison was sent this email by Dr Gala Romney.  Does he continue all other medications?

## 2011-05-11 NOTE — Telephone Encounter (Signed)
Spoke with Herbert Seta and Jesusita Oka emailed her to have patient start Amlodipine 5mg  daily  Will send in RX

## 2011-05-12 LAB — D-DIMER, QUANTITATIVE: D-Dimer, Quant: <0.22

## 2011-06-10 ENCOUNTER — Other Ambulatory Visit: Payer: Self-pay | Admitting: Internal Medicine

## 2012-01-27 ENCOUNTER — Telehealth (HOSPITAL_COMMUNITY): Payer: Self-pay | Admitting: Internal Medicine

## 2012-06-08 ENCOUNTER — Other Ambulatory Visit (HOSPITAL_COMMUNITY): Payer: Self-pay | Admitting: *Deleted

## 2012-06-08 MED ORDER — AMLODIPINE BESYLATE 5 MG PO TABS: 5.0000 mg | ORAL_TABLET | Freq: Every day | ORAL | Status: DC

## 2013-03-24 ENCOUNTER — Encounter (HOSPITAL_COMMUNITY)
Admission: AD | Disposition: A | Discharge status: Home / Self Care | Payer: Self-pay | Source: Ambulatory Visit | Attending: Otolaryngology

## 2013-03-24 ENCOUNTER — Ambulatory Visit (HOSPITAL_COMMUNITY): Payer: 59 | Admitting: Anesthesiology

## 2013-03-24 ENCOUNTER — Encounter (HOSPITAL_COMMUNITY): Payer: Self-pay | Admitting: Anesthesiology

## 2013-03-24 ENCOUNTER — Ambulatory Visit (HOSPITAL_COMMUNITY)
Admission: AD | Admit: 2013-03-24 | Discharge: 2013-03-25 | Disposition: A | Discharge status: Home / Self Care | Payer: 59 | Source: Ambulatory Visit | Attending: Otolaryngology | Admitting: Otolaryngology

## 2013-03-24 ENCOUNTER — Ambulatory Visit (HOSPITAL_COMMUNITY): Payer: 59

## 2013-03-24 ENCOUNTER — Encounter (HOSPITAL_COMMUNITY): Payer: Self-pay | Admitting: *Deleted

## 2013-03-24 ENCOUNTER — Other Ambulatory Visit: Payer: Self-pay | Admitting: Otolaryngology

## 2013-03-24 DIAGNOSIS — E119 Type 2 diabetes mellitus without complications: Secondary | ICD-10-CM | POA: Insufficient documentation

## 2013-03-24 DIAGNOSIS — J343 Hypertrophy of nasal turbinates: Secondary | ICD-10-CM | POA: Insufficient documentation

## 2013-03-24 DIAGNOSIS — J3489 Other specified disorders of nose and nasal sinuses: Secondary | ICD-10-CM | POA: Insufficient documentation

## 2013-03-24 DIAGNOSIS — R040 Epistaxis: Secondary | ICD-10-CM | POA: Insufficient documentation

## 2013-03-24 DIAGNOSIS — J342 Deviated nasal septum: Secondary | ICD-10-CM | POA: Insufficient documentation

## 2013-03-24 DIAGNOSIS — I10 Essential (primary) hypertension: Secondary | ICD-10-CM | POA: Insufficient documentation

## 2013-03-24 HISTORY — PX: NASAL HEMORRHAGE CONTROL: SHX287

## 2013-03-24 HISTORY — PX: NASAL SEPTOPLASTY W/ TURBINOPLASTY: SHX2070

## 2013-03-24 HISTORY — DX: Family history of other specified conditions: Z84.89

## 2013-03-24 LAB — GLUCOSE, CAPILLARY: Glucose-Capillary: 142 mg/dL — ABNORMAL HIGH (ref 70–99)

## 2013-03-24 LAB — CBC
HCT: 40.4 % (ref 39.0–52.0)
Hemoglobin: 14 g/dL (ref 13.0–17.0)
MCHC: 34.7 g/dL (ref 30.0–36.0)
WBC: 6.6 10*3/uL (ref 4.0–10.5)

## 2013-03-24 LAB — BASIC METABOLIC PANEL
BUN: 13 mg/dL (ref 6–23)
CO2: 28 mEq/L (ref 19–32)
Chloride: 102 mEq/L (ref 96–112)
GFR calc Af Amer: >90 mL/min (ref >90)
Potassium: 4.2 mEq/L (ref 3.5–5.1)

## 2013-03-24 SURGERY — SEPTOPLASTY, NOSE, WITH NASAL TURBINATE REDUCTION
Anesthesia: General | Site: Nose | Wound class: Clean Contaminated

## 2013-03-24 MED ORDER — CEFAZOLIN SODIUM-DEXTROSE 2-3 GM-% IV SOLR
2.0000 g | INTRAVENOUS | Status: DC
  Filled 2013-03-24: qty 50

## 2013-03-24 MED ORDER — LIDOCAINE-EPINEPHRINE 2 %-1:100000 IJ SOLN
INTRAMUSCULAR | Status: AC
  Filled 2013-03-24: qty 1

## 2013-03-24 MED ORDER — ONDANSETRON HCL 4 MG/2ML IJ SOLN
INTRAMUSCULAR | Status: DC | PRN
  Administered 2013-03-24 (×2): 4 mg via INTRAVENOUS

## 2013-03-24 MED ORDER — NEOMYCIN-BACITRACIN ZN-POLYMYX 5-400-10000 OP OINT
TOPICAL_OINTMENT | OPHTHALMIC | Status: AC
  Filled 2013-03-24: qty 3.5

## 2013-03-24 MED ORDER — COCAINE HCL 4 % EX SOLN
CUTANEOUS | Status: AC
  Filled 2013-03-24: qty 4

## 2013-03-24 MED ORDER — CARVEDILOL 25 MG PO TABS
25.0000 mg | ORAL_TABLET | Freq: Once | ORAL | Status: AC
Start: 2013-03-24 — End: 2013-03-24
  Administered 2013-03-24: 25 mg via ORAL
  Filled 2013-03-24: qty 1

## 2013-03-24 MED ORDER — OXYMETAZOLINE HCL 0.05 % NA SOLN
NASAL | Status: AC
  Filled 2013-03-24: qty 15

## 2013-03-24 MED ORDER — LACTATED RINGERS IV SOLN
INTRAVENOUS | Status: DC
  Administered 2013-03-24: 16:00:00 via INTRAVENOUS

## 2013-03-24 MED ORDER — DOUBLE ANTIBIOTIC 500-10000 UNIT/GM EX OINT
TOPICAL_OINTMENT | CUTANEOUS | Status: AC
  Filled 2013-03-24: qty 1

## 2013-03-24 MED ORDER — LIDOCAINE-EPINEPHRINE 1 %-1:100000 IJ SOLN
INTRAMUSCULAR | Status: AC
  Filled 2013-03-24: qty 1

## 2013-03-24 MED ORDER — OXYMETAZOLINE HCL 0.05 % NA SOLN
2.0000 | NASAL | Status: AC
  Administered 2013-03-24 (×2): 2 via NASAL
  Filled 2013-03-24: qty 15

## 2013-03-24 MED ORDER — LIDOCAINE HCL 4 % MT SOLN
OROMUCOSAL | Status: DC | PRN
  Administered 2013-03-24: 4 mL via TOPICAL

## 2013-03-24 MED ORDER — COCAINE HCL 4 % EX SOLN
CUTANEOUS | Status: DC | PRN
  Administered 2013-03-24: 4 mL via NASAL

## 2013-03-24 MED ORDER — SUCCINYLCHOLINE CHLORIDE 20 MG/ML IJ SOLN
INTRAMUSCULAR | Status: DC | PRN
  Administered 2013-03-24: 120 mg via INTRAVENOUS

## 2013-03-24 MED ORDER — ARTIFICIAL TEARS OP OINT
TOPICAL_OINTMENT | OPHTHALMIC | Status: DC | PRN
  Administered 2013-03-24: 1 via OPHTHALMIC

## 2013-03-24 MED ORDER — 0.9 % SODIUM CHLORIDE (POUR BTL) OPTIME
TOPICAL | Status: DC | PRN
  Administered 2013-03-24: 1000 mL

## 2013-03-24 MED ORDER — GLYCOPYRROLATE 0.2 MG/ML IJ SOLN
INTRAMUSCULAR | Status: DC | PRN
  Administered 2013-03-24: 0.2 mg via INTRAVENOUS

## 2013-03-24 MED ORDER — LIDOCAINE-EPINEPHRINE 1 %-1:100000 IJ SOLN
INTRAMUSCULAR | Status: DC | PRN
  Administered 2013-03-24: 10 mL

## 2013-03-24 MED ORDER — DOUBLE ANTIBIOTIC 500-10000 UNIT/GM EX OINT
TOPICAL_OINTMENT | CUTANEOUS | Status: AC
  Filled 2013-03-24: qty 2

## 2013-03-24 MED ORDER — LACTATED RINGERS IV SOLN
INTRAVENOUS | Status: DC | PRN
  Administered 2013-03-24 (×2): via INTRAVENOUS

## 2013-03-24 MED ORDER — COCAINE HCL POWD
Status: DC | PRN
  Administered 2013-03-24: 200 mg via NASAL

## 2013-03-24 MED ORDER — FENTANYL CITRATE 0.05 MG/ML IJ SOLN
INTRAMUSCULAR | Status: DC | PRN
  Administered 2013-03-24 (×2): 100 ug via INTRAVENOUS

## 2013-03-24 MED ORDER — FENTANYL CITRATE 0.05 MG/ML IJ SOLN: 100.0000 ug | Freq: Once | INTRAMUSCULAR | Status: AC

## 2013-03-24 MED ORDER — COCAINE HCL POWD
Status: AC
  Filled 2013-03-24: qty 200

## 2013-03-24 MED ORDER — PROPOFOL 10 MG/ML IV BOLUS
INTRAVENOUS | Status: DC | PRN
  Administered 2013-03-24: 160 mg via INTRAVENOUS

## 2013-03-24 MED ORDER — FENTANYL CITRATE 0.05 MG/ML IJ SOLN
INTRAMUSCULAR | Status: AC
  Administered 2013-03-24: 100 ug via INTRAVENOUS
  Filled 2013-03-24: qty 2

## 2013-03-24 MED ORDER — CEFAZOLIN SODIUM-DEXTROSE 2-3 GM-% IV SOLR
INTRAVENOUS | Status: DC | PRN
  Administered 2013-03-24: 2 g via INTRAVENOUS

## 2013-03-24 SURGICAL SUPPLY — 53 items
ATTRACTOMAT 16X20 MAGNETIC DRP (DRAPES) ×2 IMPLANT
CANISTER SUCTION 2500CC (MISCELLANEOUS) ×2 IMPLANT
CLOTH BEACON ORANGE TIMEOUT ST (SAFETY) ×2 IMPLANT
COAGULATOR SUCT SWTCH 10FR 6 (ELECTROSURGICAL) ×2 IMPLANT
COTTONBALL LRG STERILE PKG (GAUZE/BANDAGES/DRESSINGS) ×2 IMPLANT
COVER MAYO STAND STRL (DRAPES) IMPLANT
COVER SURGICAL LIGHT HANDLE (MISCELLANEOUS) IMPLANT
COVER TABLE BACK 60X90 (DRAPES) IMPLANT
CRADLE DONUT ADULT HEAD (MISCELLANEOUS) IMPLANT
DECANTER SPIKE VIAL GLASS SM (MISCELLANEOUS) ×2 IMPLANT
DRAPE PROXIMA HALF (DRAPES) ×2 IMPLANT
DRESSING TELFA 8X10 (GAUZE/BANDAGES/DRESSINGS) ×2 IMPLANT
DRESSING TELFA 8X3 (GAUZE/BANDAGES/DRESSINGS) ×2 IMPLANT
DRSG NASOPORE 8CM (GAUZE/BANDAGES/DRESSINGS) ×2 IMPLANT
ELECT REM PT RETURN 9FT ADLT (ELECTROSURGICAL) ×2
ELECTRODE REM PT RTRN 9FT ADLT (ELECTROSURGICAL) ×1 IMPLANT
FILTER ARTHROSCOPY CONVERTOR (FILTER) IMPLANT
GAUZE PACKING FOLDED 2  STR (GAUZE/BANDAGES/DRESSINGS) ×1
GAUZE PACKING FOLDED 2 STR (GAUZE/BANDAGES/DRESSINGS) ×1 IMPLANT
GAUZE SPONGE 2X2 8PLY STRL LF (GAUZE/BANDAGES/DRESSINGS) IMPLANT
GAUZE SPONGE 4X4 16PLY XRAY LF (GAUZE/BANDAGES/DRESSINGS) IMPLANT
GAUZE VASELINE FOILPK 1/2 X 72 (GAUZE/BANDAGES/DRESSINGS) IMPLANT
GLOVE BIOGEL PI IND STRL 7.0 (GLOVE) ×1 IMPLANT
GLOVE BIOGEL PI IND STRL 7.5 (GLOVE) ×2 IMPLANT
GLOVE BIOGEL PI INDICATOR 7.0 (GLOVE) ×1
GLOVE BIOGEL PI INDICATOR 7.5 (GLOVE) ×2
GLOVE ECLIPSE 8.0 STRL XLNG CF (GLOVE) ×4 IMPLANT
GLOVE SS BIOGEL STRL SZ 7.5 (GLOVE) IMPLANT
GLOVE SUPERSENSE BIOGEL SZ 7.5 (GLOVE)
GLOVE SURG SS PI 7.5 STRL IVOR (GLOVE) ×4 IMPLANT
GOWN BRE IMP PREV XXLGXLNG (GOWN DISPOSABLE) ×4 IMPLANT
GOWN PREVENTION PLUS XLARGE (GOWN DISPOSABLE) ×2 IMPLANT
GOWN STRL NON-REIN LRG LVL3 (GOWN DISPOSABLE) ×4 IMPLANT
KIT BASIN OR (CUSTOM PROCEDURE TRAY) ×2 IMPLANT
KIT ROOM TURNOVER OR (KITS) ×2 IMPLANT
NEEDLE HYPO 25GX1X1/2 BEV (NEEDLE) IMPLANT
NEEDLE SPNL 25GX3.5 QUINCKE BL (NEEDLE) ×4 IMPLANT
NS IRRIG 1000ML POUR BTL (IV SOLUTION) ×2 IMPLANT
PAD ARMBOARD 7.5X6 YLW CONV (MISCELLANEOUS) ×4 IMPLANT
PATTIES SURGICAL .5 X3 (DISPOSABLE) ×2 IMPLANT
SHEET SIL 040 (INSTRUMENTS) ×2 IMPLANT
SOLUTION ANTI FOG 6CC (MISCELLANEOUS) ×2 IMPLANT
SPECIMEN JAR SMALL (MISCELLANEOUS) IMPLANT
SPONGE GAUZE 2X2 STER 10/PKG (GAUZE/BANDAGES/DRESSINGS)
SUT CHROMIC 4 0 P 3 18 (SUTURE) ×2 IMPLANT
SUT ETHILON 3 0 PS 1 (SUTURE) ×2 IMPLANT
SUT PDS AB 4-0 P3 18 (SUTURE) ×2 IMPLANT
SUT PLAIN 4 0 ~~LOC~~ 1 (SUTURE) IMPLANT
SYR CONTROL 10ML LL (SYRINGE) IMPLANT
TOWEL OR 17X24 6PK STRL BLUE (TOWEL DISPOSABLE) ×4 IMPLANT
TRAY ENT MC OR (CUSTOM PROCEDURE TRAY) ×2 IMPLANT
TUBE CONNECTING 12X1/4 (SUCTIONS) IMPLANT
WATER STERILE IRR 1000ML POUR (IV SOLUTION) IMPLANT

## 2013-03-24 NOTE — Progress Notes (Signed)
Dr. Sampson Goon notified of increasing pain. Orders recieved

## 2013-03-24 NOTE — Anesthesia Preprocedure Evaluation (Signed)
Anesthesia Evaluation  Patient identified by MRN, date of birth, ID band Patient awake    Reviewed: Allergy & Precautions, H&P , NPO status , Patient's Chart, lab work & pertinent test results, reviewed documented beta blocker date and time   Airway Mallampati: II TM Distance: >3 FB Neck ROM: Full    Dental  (+) Teeth Intact and Dental Advisory Given   Pulmonary sleep apnea ,    Pulmonary exam normal       Cardiovascular hypertension, Pt. on medications and Pt. on home beta blockers     Neuro/Psych    GI/Hepatic negative GI ROS, Neg liver ROS,   Endo/Other  diabetes, Oral Hypoglycemic Agents  Renal/GU negative Renal ROS     Musculoskeletal   Abdominal   Peds  Hematology negative hematology ROS (+)   Anesthesia Other Findings   Reproductive/Obstetrics                           Anesthesia Physical Anesthesia Plan  ASA: III  Anesthesia Plan: General   Post-op Pain Management:    Induction: Intravenous  Airway Management Planned: Oral ETT  Additional Equipment:   Intra-op Plan:   Post-operative Plan: Extubation in OR  Informed Consent: I have reviewed the patients History and Physical, chart, labs and discussed the procedure including the risks, benefits and alternatives for the proposed anesthesia with the patient or authorized representative who has indicated his/her understanding and acceptance.   Dental advisory given  Plan Discussed with: CRNA, Anesthesiologist and Surgeon  Anesthesia Plan Comments:         Anesthesia Quick Evaluation

## 2013-03-24 NOTE — Op Note (Signed)
03/24/2013  11:38 PM    Tony Morrison  [id number]   Pre-Op Dx:  Deviated Nasal Septum, Hypertrophic Inferior Turbinates.  LEFT nasal synechiae formation.  RIGHT posterior epistaxis.  Post-op Dx: Same  Proc: Nasal Septoplasty, Bilateral SMR Inferior Turbinates , Lysis LEFT inferior turbinate synechial band.  Endoscopic control, RIGHT posterior epistaxis.    Surg:  [name] T MD  Anes:  GOT  EBL:  500 ml  Comp:  none  Findings:  Rightward septal deviation with very narrow right passage.  Prominent synechial band formation between septum and LEFT inferior turbinate.  Generalized oozing during surgery consistent with recent aspirin therapy.  Bleeding from RIGHT lateral wall of nose behind middle turbinate.  Procedure: With the patient in a comfortable supine position,  general orotracheal anesthesia was induced without difficulty.     The patient received preoperative Afrin spray for topical decongestion and vasoconstriction.  Intravenous prophylactic antibiotics were administered.  At an appropriate level, the patient was placed in a semi-sitting position.  A saline moistened throat pack was placed.  Nasal vibrissae were trimmed.  Cocaine crystals, 200 mg total,  were applied on cotton carriers to the anterior ethmoid and sphenopalatine ganglion regions on both sides.  4% cocaine solution, 4 ml was applied on 0.5" x 3" cottonoids to both sides of the septal mucosa.   1% Xylocaine with 1:100,000 epinephrine, 8 cc's, was infiltrated into the anterior floor of the nose, into the nasal spine region, into the membranous columella, and finally into the submucoperichondrial plane of the septum on both sides.  Several minutes were allowed for this to take effect.  A sterile preparation and draping of the midface was accomplished in the standard fashion.  The materials were removed from the nose and observed to be intact and correct in number.  The nose was inspected with a headlight  with the findings as described above.  A LEFT hemitransfixion incision was sharply executed and carried down to the caudal edge of the quadrangular cartilage and continued to a floor incision.  An opposite small floor incision was sharply executed as well.   Floor tunnels were elevated on both sides, carried posteriorly, then medially, then brought forward along the vomer and maxillary crest.  The submucoperichondrial plane of the  LEFT septum was dissected up to the dorsum of the nose, back onto the perpendicular plate, and brought down and communicated with a floor tunnel and then forward along the maxillary crest.  The flap was generated intact.  The chondroethmoid junction was identified and opened with a Risk analyst.  The opposite submucoperiosteal plane of the perpendicular plate of the ethmoid  was elevated and carried down to the floor tunnel posteriorly.  The superior perpendicular plate was lysed with an open Jansen-Middleton forceps.  The inferior portion was dissected from the maxillary crest and vomer with a Cottle elevator.  The midportion was rocked free with a closed [employer] forceps and then delivered.    The posterior inferior corner of the quadrangular cartilage was submucosally resected, including a cartilaginous tail up along the vomer. A 2mm strip of the inferior caudal strut was submucosally resected.  The septum was freed from the upper lateral cartilages on both sides to allow it to swing more freely into the midline.  Improved airway was noted on both sides.    The strong synechial band on the LEFT was sharply freed from the septum.  After mobilizing the septum adequately, and straightening it in the standard fashion,  The septum was secured to the nasal spine with a figure-of-eight 4-0 PDS suture.  A good straight midline configuration of the septum with good dorsal support was generated.  The septal tunnel was suctioned clear.  Hemostasis was observed.  The flaps  were laid back down.  The incisions were closed with interrupted 4-0 chromic suture.  Just prior to completing the septoplasty, the inferior turbinates were each infiltrated with additional 1% Xylocaine with 1:100,000 epinephrine,  6 cc's total.  Upon completing the septoplasty, beginning on the RIGHT side, the inferior turbinate was inspected and infractured.  The anterior hood of the inferior turbinate was sharply lysed just behind the nasal valve.  The medial mucosa of the inferior turbinate was incised in an  anterior upsloping fashion and a laterally based flap was developed from the turbinate bone.  Using angled turbinate scissors, turbinate bone and lateral mucosa were resected in a posterior downsloping fashion, taking much of the anterior pole and leaving most of the posterior pole.  Bony spicules were submucosally dissected and removed.  The mucosal flap was laid back down and the turbinate was outfractured.  This completed one SMR inferior turbinate.  The opposite side was performed in identical fashion.  Suction cautery was used to control the cut mucosal edges, and to reduce the bulbous posterior poles of the inferior turbinates.  Again hemostasis was observed.  After completing both turbinate resections, 0.040" reinforced Silastic splints were fashioned, placed against the nasal septum for support, and secured thereto with a 3-0 Ethilon stitch.     Moderate oozing continued, RIGHT greater than LEFT.  Under endoscopic vision, pulsatile oozing was noted behind the posterior pole of the middled turbinate.  This area was thoroughly suctioned with some persistent oozing.   Telfa packs impregnated with bacitracin ointment were placed between the septum and the inferior turbinates, tow on each side, for hemostasis and support.  The upper packs were tagged with a 2-0 Silk stitch.  Hemostasis was observed.    At this point the procedure was completed.  The pharynx was suctioned free and the throat  pack was removed.   The patient was returned to anesthesia, awakened, extubated, and transferred to recovery in stable condition.  Dispo:   PACU to 23 hr extended recovery.    Plan: Ice, elevation, narcotic analgesia, prophylactic antibiotics for the duration of indwelling nasal foreign bodies.  We will remove the nasal packing In one day, or 5 days depending on how much post operative oozing the patient demonstrates. We will remove  the septal splints in 10 days.  Return to work or school in 10 days, strenuous activities in two weeks.  Cephus Richer MD

## 2013-03-24 NOTE — Progress Notes (Signed)
Patient reported feeling hot patient noted to be diaphoretic. VS as noted. Patient NSR on monitor without ectopy. Placed cool compress on patient's head. Dr. Sampson Goon notified no further orders.

## 2013-03-24 NOTE — Anesthesia Procedure Notes (Signed)
Procedure Name: Intubation Date/Time: 03/24/2013 9:06 PM Performed by: Julianne Rice Z Pre-anesthesia Checklist: Patient identified, Timeout performed, Emergency Drugs available, Suction available and Patient being monitored Patient Re-evaluated:Patient Re-evaluated prior to inductionOxygen Delivery Method: Circle system utilized Preoxygenation: Pre-oxygenation with 100% oxygen Intubation Type: IV induction, Rapid sequence and Cricoid Pressure applied Laryngoscope Size: Mac and 4 Grade View: Grade I Tube type: Oral Tube size: 8.0 mm Number of attempts: 1 Airway Equipment and Method: Stylet and LTA kit utilized Placement Confirmation: ETT inserted through vocal cords under direct vision,  breath sounds checked- equal and bilateral and positive ETCO2 Secured at: 23 cm Tube secured with: Tape Dental Injury: Teeth and Oropharynx as per pre-operative assessment

## 2013-03-24 NOTE — Progress Notes (Signed)
Wife's cell phone [phone]

## 2013-03-24 NOTE — Transfer of Care (Signed)
Immediate Anesthesia Transfer of Care Note  Patient: Tony Morrison  Procedure(s) Performed: Procedure(s): NASAL SEPTOPLASTY WITH TURBINATE REDUCTION (N/A) EPISTAXIS CONTROL (N/A)  Patient Location: PACU  Anesthesia Type:General  Level of Consciousness: awake, alert  and oriented  Airway & Oxygen Therapy: Patient Spontanous Breathing and Patient connected to face mask oxygen  Post-op Assessment: Report given to PACU RN and Post -op Vital signs reviewed and stable  Post vital signs: Reviewed and stable  Complications: No apparent anesthesia complications

## 2013-03-24 NOTE — H&P (Signed)
Tony Morrison, Tony Morrison 58 y.o., male [id number]     Chief Complaint: RIGHT posterior epistaxis.  Deviated nasal septum.  Nasal obstruction  HPI: Recurrent right anterior nose bleed following a fall off a ladder about a month ago. The last one was a few hours ago.  Patient is a 57 year old male who presents for right sided epistaxis. He was seen three days ago for right sided recurrent anterior nosebleed following a fall from a ladder one month ago. He fell from almost the top of a ladder after cutting a tree limb. He fell about 12 feet landing on his back. He did not lose consciousness or sustain any head trauma. His head did hit the side of the curb. He did not seek medical attention. Nosebleeds starting occurring two weeks after the fall and are always right sided and usually related to bending over. Duration of a few minutes and stopping spontaneously. Three days ago Dr. Pollyann Kennedy cauterized the right mid nasal septum with silver nitrate cautery a No Known Drug Allergies. nd recommend use of saline spray. He returns today. Saline has not been used. Last night bleeding resumed during valsalva as he was doing colon prep for colonoscopy. It has continued to bleed this morning. His wife soaked a cotten ball in Afrin and placed in the nose. He is taking aspirin 81mg . A few weeks ago was taking NSAIDs for left shoulder pain. The only recent imaging was an x-ray of the left shoulder and neck. History of cervical disc surgery.Patient is a 57 year old male who presents for right sided epistaxis. He was seen three days ago for right sided recurrent anterior nosebleed following a fall from a ladder one month ago. He fell from almost the top of a ladder after cutting a tree limb. He fell about 12 feet landing on his back. He did not lose consciousness or sustain any head trauma. His head did hit the side of the curb. He did not seek medical attention. Nosebleeds starting occurring two weeks after the fall and are always  right sided and usually related to bending over. Duration of a few minutes and stopping spontaneously. Three days ago Dr. Pollyann Kennedy cauterized the right mid nasal septum with silver nitrate cautery a No Known Drug Allergies. nd recommend use of saline spray. He returns today. Saline has not been used. Last night bleeding resumed during valsalva as he was doing colon prep for colonoscopy. It has continued to bleed this morning. His wife soaked a cotten ball in Afrin and placed in the nose. He is taking aspirin 81mg . A few weeks ago was taking NSAIDs for left shoulder pain. The only recent imaging was an x-ray of the left shoulder and neck. History of cervical disc surgery.  PMH: Past Medical History  Diagnosis Date  . Chest pressure     cardiac cath; minimal non-obs CAD and normal EF 06/2008  . Diabetes mellitus     x5 years  . Obesity   . Hypertension     s/p syncopal episode with facial trauma due to orthostasis 10/2008. diuretic held  . Hyperlipidemia   . Depression   . Glaucoma   . History of peptic ulcer disease   . Headache     vascular headaches  . Syncopal episodes     likely due to orthostatic hypotension    Surg Hx: Past Surgical History  Procedure Laterality Date  . Left knee surgery    . Neck surgery      C5-C6  .  Vastectomy      FHx:   Family History  Problem Relation Age of Onset  . Heart disease Mother 41    CABG  . Cancer Father     lung  . Heart disease Father 31    CABG   SocHx:  reports that he quit smoking about 30 years ago. He does not have any smokeless tobacco history on file. He reports that he does not drink alcohol. His drug history is not on file.  ALLERGIES:  Allergies  Allergen Reactions  . Codeine     REACTION: GI uspet  . Prednisone      (Not in a hospital admission)  No results found for this or any previous visit (from the past 48 hour(s)). No results found.  WUJ:WJXBJYNW: Not feeling tired (fatigue).  No fever. Head: No  headache. Otolaryngeal: No hearing loss, no earache, no tinnitus, and no purulent nasal discharge.  No nasal passage blockage (stuffiness), no hoarseness, and no sore throat. Pulmonary: No cough. Gastrointestinal: No dysphagia. Neurological: No dizziness.  There were no vitals taken for this visit.  PHYSICAL EXAM:  APPEARANCE: Well developed, well nourished, in no acute distress.  Normal affect, in a pleasant mood.  Oriented to time, place and person. COMMUNICATION: Normal voice   HEAD & FACE:  No scars, lesions or masses of head and face.  Sinuses nontender to palpation.  Salivary glands without mass or tenderness.  Facial strength symmetric.   EYES: EOMI with normal primary gaze alignment. Visual acuity grossly intact.  PERRLA EXTERNAL EAR & NOSE: No scars, lesions or masses  EAC & TYMPANIC MEMBRANE:  EAC shows no obstructing lesions or debris and tympanic membranes are normal bilaterally. Left TM with minimal blood in middle ear space along the long process of the malleus.  GROSS HEARING: Normal   TMJ:  Nontender  INTRANASAL EXAM: Cotten packing in right nasal vestibule, bloody. Left nasal cavity clear.   NASOPHARYNX: Normal, without lesions. LIPS, TEETH & GUMS: No lip lesions, normal dentition and normal gums. ORAL CAVITY/OROPHARYNX:  Oral mucosa moist without lesion or asymmetry of the palate, tongue, tonsil or posterior pharynx. Blood in oropharynx. NECK:  Supple without adenopathy or mass. THYROID:  Normal with no masses palpable.  NEUROLOGIC:  No gross CN deficits. No nystagmus noted.   LYMPHATIC:  No enlarged nodes palpable. LUNGS:  Clear to auscultation HEART:  Reg rate and rhythm, no murmurs ABD:  Obese, active EXT:  Nl config. NEURO:  Intact, symmetri    Assessment/Plan Epistaxis (784.7) (R04.0). Deviated nasal septum (470) (J34.2). Hypertrophy of nasal turbinates (478.0) (J34.3). Scar of nose (709.2) (L90.5).  To hear your story, you have been bleeding quite a  lot. As you have a deviated septum, and a very narrow RIGHT nose. You have scarring in the LEFT nose probably leftover from your jaw surgery.  I simply cannot get all the way back in your RIGHT nose where I think it is bleeding from. We are going to straighten the septum, and try to open up the scar tissue and either cauterize or pack the bleeding areas.    We will remove the nasal packing tomorrow morning depending on where I find the bleeding spots.  It will be a couple of weeks before the nose is really opened up as far as breathing. Stay off the aspirin for now.  Cephalexin 500 MG Oral Capsule;TAKE 1 CAPSULE 4 TIMES DAILY; Qty40; R0; Rx. Oxycodone-Acetaminophen 5-325 MG Oral Tablet;TAKE 1 TO 2 TABLETS EVERY 4  TO 6 HOURS AS NEEDED FOR PAIN; Qty30; R0; Rx. Hydrocodone-Acetaminophen 5-325 MG Oral Tablet;TAKE 1 TO 2 TABLETS EVERY 4 TO 6 HOURS AS NEEDED FOR PAIN; Qty30; R1; Rx.  [name] 03/24/2013, 3:33 PM

## 2013-03-24 NOTE — Progress Notes (Signed)
03/24/13 1553  OBSTRUCTIVE SLEEP APNEA  Have you ever been diagnosed with sleep apnea through a sleep study? No  Do you snore loudly (loud enough to be heard through closed doors)?  0  Do you often feel tired, fatigued, or sleepy during the daytime? 0  Has anyone observed you stop breathing during your sleep? 0  Do you have, or are you being treated for high blood pressure? 1  BMI more than 35 kg/m2? 1  Age over 56 years old? 1  Neck circumference greater than 40 cm/18 inches? 0  Gender: 1  Obstructive Sleep Apnea Score 4  Score 4 or greater  Results sent to PCP

## 2013-03-25 LAB — CBC
MCH: 29 pg (ref 26.0–34.0)
Platelets: 218 10*3/uL (ref 150–400)
RBC: 3.97 MIL/uL — ABNORMAL LOW (ref 4.22–5.81)
RDW: 14.1 % (ref 11.5–15.5)
WBC: 8.7 10*3/uL (ref 4.0–10.5)

## 2013-03-25 LAB — GLUCOSE, CAPILLARY: Glucose-Capillary: 186 mg/dL — ABNORMAL HIGH (ref 70–99)

## 2013-03-25 MED ORDER — GLIMEPIRIDE 4 MG PO TABS
4.0000 mg | ORAL_TABLET | Freq: Two times a day (BID) | ORAL | Status: DC
Start: 2013-03-25 — End: 2013-03-25
  Administered 2013-03-25: 4 mg via ORAL
  Filled 2013-03-25 (×3): qty 1

## 2013-03-25 MED ORDER — FLUOXETINE HCL 40 MG PO CAPS: 40.0000 mg | ORAL_CAPSULE | Freq: Every day | ORAL | Status: DC

## 2013-03-25 MED ORDER — HYDROMORPHONE HCL PF 1 MG/ML IJ SOLN
0.2500 mg | INTRAMUSCULAR | Status: DC | PRN
  Administered 2013-03-25 (×2): 0.5 mg via INTRAVENOUS

## 2013-03-25 MED ORDER — ATORVASTATIN CALCIUM 10 MG PO TABS
10.0000 mg | ORAL_TABLET | Freq: Every day | ORAL | Status: DC
  Filled 2013-03-25: qty 1

## 2013-03-25 MED ORDER — LISINOPRIL 40 MG PO TABS
40.0000 mg | ORAL_TABLET | Freq: Every day | ORAL | Status: AC
  Administered 2013-03-25: 40 mg via ORAL
  Filled 2013-03-25: qty 1

## 2013-03-25 MED ORDER — HYDROCHLOROTHIAZIDE 25 MG PO TABS
12.5000 mg | ORAL_TABLET | Freq: Every day | ORAL | Status: DC
  Administered 2013-03-25: 12.5 mg via ORAL
  Filled 2013-03-25: qty 0.5

## 2013-03-25 MED ORDER — LINAGLIPTIN 5 MG PO TABS
5.0000 mg | ORAL_TABLET | Freq: Every day | ORAL | Status: DC
  Administered 2013-03-25: 5 mg via ORAL
  Filled 2013-03-25: qty 1

## 2013-03-25 MED ORDER — PANTOPRAZOLE SODIUM 40 MG PO TBEC
40.0000 mg | DELAYED_RELEASE_TABLET | Freq: Every day | ORAL | Status: DC
  Administered 2013-03-25: 40 mg via ORAL
  Filled 2013-03-25: qty 1

## 2013-03-25 MED ORDER — HYDROCODONE-ACETAMINOPHEN 7.5-325 MG/15ML PO SOLN
10.0000 mL | ORAL | Status: DC | PRN
  Administered 2013-03-25: 20 mL via ORAL
  Filled 2013-03-25: qty 30

## 2013-03-25 MED ORDER — PROMETHAZINE HCL 25 MG/ML IJ SOLN: 6.2500 mg | INTRAMUSCULAR | Status: DC | PRN

## 2013-03-25 MED ORDER — ONDANSETRON HCL 4 MG PO TABS: 4.0000 mg | ORAL_TABLET | ORAL | Status: DC | PRN

## 2013-03-25 MED ORDER — AMLODIPINE BESYLATE 5 MG PO TABS
5.0000 mg | ORAL_TABLET | Freq: Every day | ORAL | Status: DC
  Administered 2013-03-25: 5 mg via ORAL
  Filled 2013-03-25: qty 1

## 2013-03-25 MED ORDER — CEPHALEXIN 500 MG PO CAPS: 500.0000 mg | ORAL_CAPSULE | Freq: Four times a day (QID) | ORAL | Status: DC

## 2013-03-25 MED ORDER — BACITRACIN ZINC 500 UNIT/GM EX OINT
1.0000 "application " | TOPICAL_OINTMENT | Freq: Three times a day (TID) | CUTANEOUS | Status: DC
  Filled 2013-03-25: qty 28.35

## 2013-03-25 MED ORDER — CEFAZOLIN SODIUM 1 G IJ SOLR
500.0000 mg | Freq: Three times a day (TID) | INTRAMUSCULAR | Status: DC
  Administered 2013-03-25 (×2): 500 mg via INTRAVENOUS
  Filled 2013-03-25 (×4): qty 5

## 2013-03-25 MED ORDER — FLUOXETINE HCL 20 MG PO CAPS
40.0000 mg | ORAL_CAPSULE | Freq: Every day | ORAL | Status: DC
  Administered 2013-03-25: 40 mg via ORAL
  Filled 2013-03-25: qty 2

## 2013-03-25 MED ORDER — ALPRAZOLAM 0.5 MG PO TABS
0.5000 mg | ORAL_TABLET | Freq: Every evening | ORAL | Status: DC | PRN
  Administered 2013-03-25: 0.5 mg via ORAL
  Filled 2013-03-25: qty 1

## 2013-03-25 MED ORDER — METFORMIN HCL 500 MG PO TABS
1000.0000 mg | ORAL_TABLET | Freq: Three times a day (TID) | ORAL | Status: DC
  Administered 2013-03-25: 1000 mg via ORAL
  Filled 2013-03-25 (×2): qty 2
  Filled 2013-03-25: qty 1
  Filled 2013-03-25: qty 2

## 2013-03-25 MED ORDER — CARVEDILOL 25 MG PO TABS
25.0000 mg | ORAL_TABLET | Freq: Two times a day (BID) | ORAL | Status: DC
Start: 2013-03-25 — End: 2013-03-25
  Administered 2013-03-25: 25 mg via ORAL
  Filled 2013-03-25 (×3): qty 1

## 2013-03-25 MED ORDER — METFORMIN HCL 500 MG PO TABS
1000.0000 mg | ORAL_TABLET | Freq: Three times a day (TID) | ORAL | Status: DC
Start: 2013-03-25 — End: 2013-03-25

## 2013-03-25 MED ORDER — HYDROMORPHONE HCL PF 1 MG/ML IJ SOLN
INTRAMUSCULAR | Status: AC
  Filled 2013-03-25: qty 1

## 2013-03-25 MED ORDER — METFORMIN HCL 500 MG PO TABS: 1000.0000 mg | ORAL_TABLET | Freq: Two times a day (BID) | ORAL | Status: DC

## 2013-03-25 MED ORDER — OXYCODONE-ACETAMINOPHEN 5-325 MG PO TABS
1.0000 | ORAL_TABLET | ORAL | Status: DC | PRN
  Administered 2013-03-25 (×2): 2 via ORAL
  Filled 2013-03-25 (×2): qty 2

## 2013-03-25 MED ORDER — ONDANSETRON HCL 4 MG/2ML IJ SOLN: 4.0000 mg | INTRAMUSCULAR | Status: DC | PRN

## 2013-03-25 MED ORDER — SODIUM CHLORIDE 0.45 % IV SOLN
INTRAVENOUS | Status: DC
  Administered 2013-03-25 (×2): via INTRAVENOUS

## 2013-03-25 NOTE — Anesthesia Postprocedure Evaluation (Signed)
Anesthesia Post Note  Patient: Tony Morrison  Procedure(s) Performed: Procedure(s) (LRB): NASAL SEPTOPLASTY WITH TURBINATE REDUCTION (N/A) EPISTAXIS CONTROL (N/A)  Anesthesia type: general  Patient location: PACU  Post pain: Pain level controlled  Post assessment: Patient's Cardiovascular Status Stable  Last Vitals:  Filed Vitals:   03/24/13 1934  BP: 135/78  Pulse: 62  Temp:   Resp:     Post vital signs: Reviewed and stable  Level of consciousness: sedated  Complications: No apparent anesthesia complications

## 2013-03-25 NOTE — Progress Notes (Signed)
Discharged home. Home discharge instruction given, no questions verbalized.

## 2013-03-25 NOTE — Discharge Summary (Signed)
03/25/2013 12:58 PM  Tony Morrison [id number]  Post-Op Day 1.  Discharge Note    Temp:  [97.6 F (36.4 C)-100.5 F (38.1 C)] 98.1 F (36.7 C) (08/16 1023) Pulse Rate:  [62-84] 68 (08/16 1023) Resp:  [12-20] 18 (08/16 1023) BP: (135-180)/(72-95) 146/77 mmHg (08/16 1023) SpO2:  [96 %-100 %] 97 % (08/16 1023) Weight:  [108 kg (238 lb 1.6 oz)-110.224 kg (243 lb)] 108 kg (238 lb 1.6 oz) (08/16 0044),     Intake/Output Summary (Last 24 hours) at 03/25/13 1258 Last data filed at 03/25/13 0600  Gross per 24 hour  Intake 4040.09 ml  Output    502 ml  Net 3538.09 ml    Results for orders placed during the hospital encounter of 03/24/13 (from the past 24 hour(s))  GLUCOSE, CAPILLARY     Status: Abnormal   Collection Time    03/24/13  3:46 PM      Result Value Range   Glucose-Capillary 148 (*) 70 - 99 mg/dL  BASIC METABOLIC PANEL     Status: Abnormal   Collection Time    03/24/13  4:22 PM      Result Value Range   Sodium 144  135 - 145 mEq/L   Potassium 4.2  3.5 - 5.1 mEq/L   Chloride 102  96 - 112 mEq/L   CO2 28  19 - 32 mEq/L   Glucose, Bld 160 (*) 70 - 99 mg/dL   BUN 13  6 - 23 mg/dL   Creatinine, Ser 4.09  0.50 - 1.35 mg/dL   Calcium 81.1  8.4 - 91.4 mg/dL   GFR calc non Af Amer >90  >90 mL/min   GFR calc Af Amer >90  >90 mL/min  CBC     Status: None   Collection Time    03/24/13  4:22 PM      Result Value Range   WBC 6.6  4.0 - 10.5 K/uL   RBC 4.67  4.22 - 5.81 MIL/uL   Hemoglobin 14.0  13.0 - 17.0 g/dL   HCT 78.2  95.6 - 21.3 %   MCV 86.5  78.0 - 100.0 fL   MCH 30.0  26.0 - 34.0 pg   MCHC 34.7  30.0 - 36.0 g/dL   RDW 08.6  57.8 - 46.9 %   Platelets 232  150 - 400 K/uL  GLUCOSE, CAPILLARY     Status: Abnormal   Collection Time    03/24/13  6:05 PM      Result Value Range   Glucose-Capillary 142 (*) 70 - 99 mg/dL   Comment 1 Documented in Chart     Comment 2 Notify RN    GLUCOSE, CAPILLARY     Status: Abnormal   Collection Time    03/24/13  8:54 PM     Result Value Range   Glucose-Capillary 152 (*) 70 - 99 mg/dL   Comment 1 Notify RN    GLUCOSE, CAPILLARY     Status: Abnormal   Collection Time    03/24/13 11:54 PM      Result Value Range   Glucose-Capillary 193 (*) 70 - 99 mg/dL  CBC     Status: Abnormal   Collection Time    03/25/13  6:10 AM      Result Value Range   WBC 8.7  4.0 - 10.5 K/uL   RBC 3.97 (*) 4.22 - 5.81 MIL/uL   Hemoglobin 11.5 (*) 13.0 - 17.0 g/dL   HCT 62.9 (*) 52.8 - 41.3 %  MCV 86.9  78.0 - 100.0 fL   MCH 29.0  26.0 - 34.0 pg   MCHC 33.3  30.0 - 36.0 g/dL   RDW 16.1  09.6 - 04.5 %   Platelets 218  150 - 400 K/uL  GLUCOSE, CAPILLARY     Status: Abnormal   Collection Time    03/25/13  7:31 AM      Result Value Range   Glucose-Capillary 186 (*) 70 - 99 mg/dL  GLUCOSE, CAPILLARY     Status: Abnormal   Collection Time    03/25/13 11:42 AM      Result Value Range   Glucose-Capillary 190 (*) 70 - 99 mg/dL    SUBJECTIVE:  Mod discomfort.  No bleeding.  Spontaneous void.  No N,V.  Eating without difficulty.  OBJECTIVE:  Color, energy good.  Drip pad clean and dry.  IMPRESSION:  Satisfactory check.  Mild fever likely atelectasis.  Mild HTN likely pain.  PLAN:  Discharge home.  Will remove packing Mon or Tues.   Admit: 15 AUG Discharge:  16 AUG Final Diagnosis:  Epistaxis.  Deviated Nasal Septum.  Hypertrophic inferior turbinates. LEFT nasal synechial band.  DM.  HTN. Proc: septoplasty, SMR turbs.  Lysis LEFT intranasal synechium.  Endoscopic control epistaxis, RIGHT. Comp:  None Cond:  Ambulatory.  Breathing and eating well.  Spont void.  Pain controlled.  No bleeding Re check visit: 2-3 days, my office Rx's:  Keflex, Oxycodone, Hydrocodone Instructions written and given .  [name]

## 2013-03-27 ENCOUNTER — Encounter (HOSPITAL_COMMUNITY): Payer: Self-pay | Admitting: Otolaryngology

## 2013-05-10 ENCOUNTER — Encounter: Payer: Self-pay | Admitting: *Deleted

## 2013-10-06 ENCOUNTER — Ambulatory Visit (HOSPITAL_BASED_OUTPATIENT_CLINIC_OR_DEPARTMENT_OTHER): Payer: 59 | Attending: Otolaryngology | Admitting: *Deleted

## 2013-10-06 DIAGNOSIS — G4730 Sleep apnea, unspecified: Principal | ICD-10-CM

## 2013-10-06 DIAGNOSIS — G4733 Obstructive sleep apnea (adult) (pediatric): Secondary | ICD-10-CM

## 2013-10-06 DIAGNOSIS — G4710 Hypersomnia, unspecified: Secondary | ICD-10-CM | POA: Insufficient documentation

## 2013-10-11 ENCOUNTER — Encounter (HOSPITAL_BASED_OUTPATIENT_CLINIC_OR_DEPARTMENT_OTHER): Payer: Self-pay | Admitting: *Deleted

## 2013-10-14 DIAGNOSIS — G4733 Obstructive sleep apnea (adult) (pediatric): Secondary | ICD-10-CM

## 2013-10-14 NOTE — Sleep Study (Signed)
NAME: Tony Morrison DATE OF BIRTH:  1956-01-10 MEDICAL RECORD NUMBER [id number]  LOCATION: [facility]  PHYSICIAN: Kentrell Guettler D  DATE OF STUDY: 10/06/2013  SLEEP STUDY TYPE: Out of Center Sleep Test                REFERRING PHYSICIAN: [name], MD  INDICATION FOR STUDY: Hypersomnia with sleep apnea for evaluation after surgery  EPWORTH SLEEPINESS SCORE:  /24 HEIGHT:   5 feet 8 inches WEIGHT:   257 pounds   BMI 36.5 NECK SIZE: 17 in.  MEDICATIONS: Charted for review  IMPRESSION:  Apnea hypopnea index (AHI) 15.9 per hour. Total 170 events scored including 5 central apneas, 22 obstructive apneas, 4 mixed apneas, 76 hypopneas. Events were seen in all positions. Snoring with oxygen desaturation to a nadir of 80% and mean oxygen saturation through the study of 94% on room air.    RECOMMENDATION:   1) Scores in this range are often treated first with CPAP. Alternatives including an oral appliance would be chosen on an individual basis.   Signed Jetty Duhamel M.D. Waymon Budge Diplomate, American Board of Sleep Medicine  ELECTRONICALLY SIGNED ON:  10/14/2013, 8:52 AM [facility] PH: [phone]   FX: [phone] ACCREDITED BY THE AMERICAN ACADEMY OF SLEEP MEDICINE

## 2017-07-12 ENCOUNTER — Other Ambulatory Visit: Payer: Self-pay | Admitting: Occupational Medicine

## 2017-07-12 DIAGNOSIS — T148XXA Other injury of unspecified body region, initial encounter: Secondary | ICD-10-CM

## 2017-07-15 ENCOUNTER — Ambulatory Visit (HOSPITAL_COMMUNITY)
Admission: RE | Admit: 2017-07-15 | Discharge: 2017-07-15 | Disposition: A | Discharge status: Home / Self Care | Payer: PRIVATE HEALTH INSURANCE | Source: Ambulatory Visit | Attending: Occupational Medicine | Admitting: Occupational Medicine

## 2017-07-15 ENCOUNTER — Ambulatory Visit (HOSPITAL_COMMUNITY): Payer: Self-pay

## 2017-07-15 DIAGNOSIS — S83411A Sprain of medial collateral ligament of right knee, initial encounter: Secondary | ICD-10-CM | POA: Diagnosis not present

## 2017-07-15 DIAGNOSIS — X58XXXA Exposure to other specified factors, initial encounter: Secondary | ICD-10-CM | POA: Insufficient documentation

## 2017-07-15 DIAGNOSIS — T148XXA Other injury of unspecified body region, initial encounter: Secondary | ICD-10-CM

## 2019-03-17 MED FILL — FLUoxetine HCL 40 MG CAPS: 40 | 30 days supply | Qty: 30 | Fill #0

## 2019-03-17 MED FILL — KOMBIGLYZE XR 2.5-1,000 MG: 2.5-1000 | 30 days supply | Qty: 60 | Fill #0

## 2019-03-17 MED FILL — ROSUVASTATIN CALCIUM 5 MG T: 5 | 30 days supply | Qty: 30 | Fill #0

## 2019-03-17 MED FILL — AMLODIPINE BESYLATE 5 MG TA: 5 | 30 days supply | Qty: 30 | Fill #0

## 2019-03-17 MED FILL — HUMALOG 100 UNITS/ML KWIKPE: 100 | 28 days supply | Qty: 3 | Fill #0

## 2019-03-21 MED FILL — metFORMIN HCL 1000 MG TABS: 1000 | 30 days supply | Qty: 15 | Fill #0

## 2019-03-30 MED FILL — LANTUS SOLOSTAR 100 UNITS/M: 100 | 30 days supply | Qty: 18 | Fill #0

## 2019-03-30 MED FILL — CARVEDILOL 25 MG TABLET: 25 | 30 days supply | Qty: 60 | Fill #0

## 2019-03-30 MED FILL — LISINOPRIL 40 MG TABLET: 40 | 30 days supply | Qty: 30 | Fill #0

## 2019-03-30 MED FILL — PANTOPRAZOLE SOD DR 40 MG T: 40 | 30 days supply | Qty: 30 | Fill #0

## 2019-03-30 MED FILL — HYDROCHLOROTHIAZIDE 25 MG T: 25 | 30 days supply | Qty: 15 | Fill #0

## 2019-04-12 MED FILL — FLUoxetine HCL 40 MG CAPS: 40 | 30 days supply | Qty: 30 | Fill #1

## 2019-04-12 MED FILL — AMLODIPINE BESYLATE 5 MG TA: 5 | 30 days supply | Qty: 30 | Fill #1

## 2019-04-12 MED FILL — GLIMEPIRIDE 4 MG TABS: 4 | 30 days supply | Qty: 60 | Fill #0

## 2019-04-12 MED FILL — ROSUVASTATIN CALCIUM 5 MG T: 5 | 30 days supply | Qty: 30 | Fill #1

## 2019-04-12 MED FILL — KOMBIGLYZE XR 2.5-1,000 MG: 2.5-1000 | 30 days supply | Qty: 60 | Fill #1

## 2019-04-26 MED FILL — metFORMIN HCL 1000 MG TABS: 1000 | 30 days supply | Qty: 15 | Fill #1

## 2019-04-26 MED FILL — LISINOPRIL 40 MG TABLET: 40 | 30 days supply | Qty: 30 | Fill #1

## 2019-04-26 MED FILL — HYDROCHLOROTHIAZIDE 25 MG T: 25 | 30 days supply | Qty: 15 | Fill #1

## 2019-05-19 MED FILL — ROSUVASTATIN CALCIUM 5 MG T: 5 | 30 days supply | Qty: 30 | Fill #2

## 2019-05-19 MED FILL — AMLODIPINE BESYLATE 5 MG TA: 5 | 30 days supply | Qty: 30 | Fill #2

## 2019-05-19 MED FILL — KOMBIGLYZE XR 2.5-1,000 MG: 2.5-1000 | 30 days supply | Qty: 60 | Fill #2

## 2019-05-23 MED FILL — HUMALOG 100 UNITS/ML KWIKPE: 100 | 27 days supply | Qty: 12 | Fill #0

## 2019-05-24 MED FILL — LISINOPRIL 40 MG TABLET: 40 | 30 days supply | Qty: 30 | Fill #2

## 2019-06-04 MED FILL — metFORMIN HCL 1000 MG TABS: 1000 | 30 days supply | Qty: 15 | Fill #2

## 2019-06-04 MED FILL — HYDROCHLOROTHIAZIDE 25 MG T: 25 | 30 days supply | Qty: 15 | Fill #2

## 2019-06-05 MED FILL — CARVEDILOL 25 MG TABLET: 25 | 30 days supply | Qty: 60 | Fill #2

## 2019-06-12 MED FILL — LANTUS SOLOSTAR 100 UNITS/M: 100 | 30 days supply | Qty: 18 | Fill #2

## 2019-06-12 MED FILL — FLUoxetine HCL 40 MG CAPS: 40 | 30 days supply | Qty: 30 | Fill #3

## 2019-06-13 MED FILL — AMLODIPINE BESYLATE 5 MG TA: 5 | 30 days supply | Qty: 30 | Fill #3

## 2019-06-14 MED FILL — PANTOPRAZOLE SOD DR 40 MG T: 40 | 30 days supply | Qty: 30 | Fill #0

## 2019-06-22 MED FILL — KOMBIGLYZE XR 2.5-1,000 MG: 2.5-1000 | 30 days supply | Qty: 60 | Fill #3

## 2019-06-22 MED FILL — ROSUVASTATIN CALCIUM 5 MG T: 5 | 30 days supply | Qty: 30 | Fill #3

## 2019-06-22 MED FILL — GLIMEPIRIDE 4 MG TABLET: 4 | 30 days supply | Qty: 60 | Fill #2

## 2019-06-27 MED FILL — LISINOPRIL 40 MG TABLET: 40 | 30 days supply | Qty: 30 | Fill #3

## 2019-07-03 MED FILL — HYDROCHLOROTHIAZIDE 25 MG T: 25 | 30 days supply | Qty: 15 | Fill #3

## 2019-07-03 MED FILL — metFORMIN HCL 1000 MG TABS: 1000 | 30 days supply | Qty: 15 | Fill #3

## 2019-07-03 MED FILL — CARVEDILOL 25 MG TABLET: 25 | 30 days supply | Qty: 60 | Fill #3

## 2019-07-20 MED FILL — PANTOPRAZOLE SOD DR 40 MG T: 40 | 30 days supply | Qty: 30 | Fill #1

## 2019-07-20 MED FILL — FLUoxetine HCL 40 MG CAPS: 40 | 30 days supply | Qty: 30 | Fill #4

## 2019-07-20 MED FILL — LANTUS SOLOSTAR 100 UNITS/M: 100 | 30 days supply | Qty: 18 | Fill #3

## 2019-07-20 MED FILL — KOMBIGLYZE XR 2.5-1,000 MG: 2.5-1000 | 30 days supply | Qty: 60 | Fill #4

## 2019-07-20 MED FILL — GLIMEPIRIDE 4 MG TABLET: 4 | 30 days supply | Qty: 60 | Fill #3

## 2019-07-20 MED FILL — ROSUVASTATIN CALCIUM 5 MG T: 5 | 30 days supply | Qty: 30 | Fill #4

## 2019-07-20 MED FILL — AMLODIPINE BESYLATE 5 MG TA: 5 | 30 days supply | Qty: 30 | Fill #4

## 2019-08-02 MED FILL — LISINOPRIL 40 MG TABLET: 40 | 30 days supply | Qty: 30 | Fill #4

## 2019-08-02 MED FILL — metFORMIN HCL 1000 MG TABS: 1000 | 30 days supply | Qty: 15 | Fill #4

## 2019-08-10 MED FILL — HYDROCHLOROTHIAZIDE 25 MG T: 25 | 30 days supply | Qty: 15 | Fill #4

## 2019-08-10 MED FILL — CARVEDILOL 25 MG TABLET: 25 | 30 days supply | Qty: 60 | Fill #4

## 2019-08-10 MED FILL — HUMALOG 100 UNITS/ML KWIKPE: 100 | 27 days supply | Qty: 12 | Fill #1

## 2019-08-15 MED FILL — AMLODIPINE BESYLATE 5 MG TA: 5 | 30 days supply | Qty: 30 | Fill #5

## 2019-08-15 MED FILL — FLUoxetine HCL 40 MG CAPS: 40 | 30 days supply | Qty: 30 | Fill #0

## 2019-08-15 MED FILL — PANTOPRAZOLE SOD DR 40 MG T: 40 | 30 days supply | Qty: 30 | Fill #2

## 2019-08-24 MED FILL — KOMBIGLYZE XR 2.5-1,000 MG: 2.5-1000 | 30 days supply | Qty: 60 | Fill #5

## 2019-08-24 MED FILL — LANTUS SOLOSTAR 100 UNITS/M: 100 | 30 days supply | Qty: 18 | Fill #4

## 2019-08-24 MED FILL — GLIMEPIRIDE 4 MG TABLET: 4 | 30 days supply | Qty: 60 | Fill #4

## 2019-08-24 MED FILL — ROSUVASTATIN CALCIUM 5 MG T: 5 | 30 days supply | Qty: 30 | Fill #5

## 2019-09-05 MED FILL — metFORMIN HCL 1000 MG TABS: 1000 | 30 days supply | Qty: 15 | Fill #5

## 2019-09-06 ENCOUNTER — Other Ambulatory Visit (HOSPITAL_COMMUNITY): Payer: Self-pay | Admitting: Family Medicine

## 2019-09-06 MED FILL — LISINOPRIL 40 MG TABLET: 40 | 30 days supply | Qty: 30 | Fill #0

## 2019-09-06 MED FILL — CARVEDILOL 25 MG TABLET: 25 | 30 days supply | Qty: 60 | Fill #5

## 2019-09-06 MED FILL — HYDROCHLOROTHIAZIDE 25 MG T: 25 | 30 days supply | Qty: 15 | Fill #0

## 2019-09-14 MED FILL — AMLODIPINE BESYLATE 5 MG TA: 5 | 30 days supply | Qty: 30 | Fill #6

## 2019-09-14 MED FILL — FLUoxetine HCL 40 MG CAPS: 40 | 30 days supply | Qty: 30 | Fill #1

## 2019-09-15 MED FILL — LANTUS SOLOSTAR 100 UNITS/M: 100 | 30 days supply | Qty: 18 | Fill #0

## 2019-09-19 MED FILL — FENOFIBRATE 160 MG TABLET: 160 | 30 days supply | Qty: 30 | Fill #0

## 2019-09-22 MED FILL — PANTOPRAZOLE SOD DR 40 MG T: 40 | 30 days supply | Qty: 30 | Fill #3

## 2019-09-22 MED FILL — KOMBIGLYZE XR 2.5-1,000 MG: 2.5-1000 | 30 days supply | Qty: 60 | Fill #0

## 2019-09-22 MED FILL — GLIMEPIRIDE 4 MG TABLET: 4 | 30 days supply | Qty: 60 | Fill #0

## 2019-10-01 ENCOUNTER — Other Ambulatory Visit (HOSPITAL_COMMUNITY): Payer: Self-pay | Admitting: Family Medicine

## 2019-10-02 MED FILL — ROSUVASTATIN CALCIUM 5 MG T: 5 | 30 days supply | Qty: 30 | Fill #0

## 2019-10-02 MED FILL — LISINOPRIL 40 MG TABLET: 40 | 30 days supply | Qty: 30 | Fill #1

## 2019-10-09 MED FILL — HYDROCHLOROTHIAZIDE 25 MG T: 25 | 30 days supply | Qty: 15 | Fill #1

## 2019-10-09 MED FILL — CARVEDILOL 25 MG TABLET: 25 | 30 days supply | Qty: 60 | Fill #6

## 2019-10-10 ENCOUNTER — Other Ambulatory Visit (HOSPITAL_COMMUNITY): Payer: Self-pay | Admitting: Family Medicine

## 2019-10-10 MED FILL — LISINOPRIL 40 MG TABLET: 40 | 30 days supply | Qty: 30 | Fill #0

## 2019-10-10 MED FILL — AMLODIPINE BESYLATE 5 MG TA: 5 | 30 days supply | Qty: 30 | Fill #0

## 2019-10-10 MED FILL — ROSUVASTATIN CALCIUM 5 MG T: 5 | 30 days supply | Qty: 30 | Fill #0

## 2019-10-19 MED FILL — GLIMEPIRIDE 4 MG TABLET: 4 | 30 days supply | Qty: 60 | Fill #1

## 2019-10-19 MED FILL — HUMALOG 100 UNITS/ML KWIKPE: 100 | 27 days supply | Qty: 12 | Fill #2

## 2019-10-19 MED FILL — KOMBIGLYZE XR 2.5-1,000 MG: 2.5-1000 | 30 days supply | Qty: 60 | Fill #1

## 2019-10-19 MED FILL — FENOFIBRATE 160 MG TABLET: 160 | 30 days supply | Qty: 30 | Fill #1

## 2019-10-19 MED FILL — FLUoxetine HCL 40 MG CAPS: 40 | 30 days supply | Qty: 30 | Fill #2

## 2019-10-23 ENCOUNTER — Other Ambulatory Visit (HOSPITAL_COMMUNITY): Payer: Self-pay | Admitting: Family Medicine

## 2019-10-23 MED FILL — PANTOPRAZOLE SOD DR 40 MG T: 40 | 31 days supply | Qty: 31 | Fill #0

## 2019-10-26 ENCOUNTER — Ambulatory Visit (INDEPENDENT_AMBULATORY_CARE_PROVIDER_SITE_OTHER): Payer: 59 | Admitting: Orthopaedic Surgery

## 2019-10-26 ENCOUNTER — Ambulatory Visit: Payer: Self-pay

## 2019-10-26 ENCOUNTER — Encounter: Payer: Self-pay | Admitting: Orthopaedic Surgery

## 2019-10-26 ENCOUNTER — Other Ambulatory Visit: Payer: Self-pay

## 2019-10-26 DIAGNOSIS — M25561 Pain in right knee: Secondary | ICD-10-CM | POA: Diagnosis not present

## 2019-10-26 DIAGNOSIS — G8929 Other chronic pain: Secondary | ICD-10-CM

## 2019-10-26 MED ORDER — HYDROCODONE-ACETAMINOPHEN 5-325 MG PO TABS: 1.0000 | ORAL_TABLET | Freq: Four times a day (QID) | ORAL | 0 refills | Status: DC | PRN

## 2019-10-26 MED ORDER — LIDOCAINE HCL 1 % IJ SOLN
3.0000 mL | INTRAMUSCULAR | Status: AC | PRN
  Administered 2019-10-26: 16:00:00 3 mL

## 2019-10-26 MED ORDER — METHYLPREDNISOLONE ACETATE 40 MG/ML IJ SUSP
40.0000 mg | INTRAMUSCULAR | Status: AC | PRN
Start: 2019-10-26 — End: 2019-10-26
  Administered 2019-10-26: 40 mg via INTRA_ARTICULAR

## 2019-10-26 MED FILL — HYDROCODON-APAP 5-325: 5-325 | 7 days supply | Qty: 30 | Fill #0

## 2019-10-26 NOTE — Progress Notes (Addendum)
Office Visit Note   Patient: Tony Morrison           Date of Birth: [birth year]           MRN: [id number] Visit Date: 10/26/2019              Requested by: Nila Nephew, MD [address] [place], [address] [place],  [place] [address] PCP: Nila Nephew, MD   Assessment & Plan: Visit Diagnoses:  1. Acute pain of right knee     Plan: Based on his previous MRI findings I do feel it is necessary to obtain a new MRI to my clinical exam today as well.  I suspect there is a meniscal tear that was not seen previously that will manifest itself at this point based on his clinical exam findings as well.  I did place a steroid injection in his right knee today to temporize the pain.  We will try a short course of hydrocodone and a hinged knee brace for the right knee.  All question concerns were answered addressed.  We will see him back after the MRI of his right knee.  Follow-Up Instructions: Return in about 3 weeks (around 11/16/2019).   Orders:  Orders Placed This Encounter  Procedures  . Large Joint Inj: R knee  . XR Knee 1-2 Views Right   Meds ordered this encounter  Medications  . HYDROcodone-acetaminophen (NORCO/VICODIN) 5-325 MG tablet    Sig: Take 1 tablet by mouth every 6 (six) hours as needed for moderate pain.    Dispense:  30 tablet    Refill:  0  . lidocaine (XYLOCAINE) 1 % (with pres) injection 3 mL  . methylPREDNISolone acetate (DEPO-MEDROL) injection 40 mg      Procedures: Large Joint Inj: R knee on 10/26/2019 3:30 PM Indications: diagnostic evaluation and pain Details: 22 G 1.5 in needle, superolateral approach  Arthrogram: No  Medications: 3 mL lidocaine 1 %; 40 mg methylPREDNISolone acetate 40 MG/ML Outcome: tolerated well, no immediate complications Procedure, treatment alternatives, risks and benefits explained, specific risks discussed. Consent was given by the patient. Immediately prior to procedure a time out was called to verify the correct patient, procedure,  equipment, support staff and site/side marked as required. Patient was prepped and draped in the usual sterile fashion.       Clinical Data: No additional findings.   Subjective: Chief Complaint  Patient presents with  . Right Knee - Pain  The patient comes in today for evaluation treatment of acute right knee pain.  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  A MRI in 2018 was obtained and it showed as significant sprain of the medial collateral ligament as well as a tear of the posterior medial capsule at the meniscal junction of the periphery.  He had been doing well until recently, when he began to experience acute right knee pain. Since then he continues to have right medial knee pain.  Pivoting and twisting activities are getting worse for him and the pain does wake him at night.  He is walking with a discernible limp.  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  We have performed previous arthroscopic intervention on his left knee.  That knee is doing well for him.  HPI  Review of Systems He currently denies any headache, chest pain, shortness of breath, fever, chills, nausea, vomiting  Objective:  Vital Signs: There were no vitals taken for this visit.  Physical Exam He is alert and orient x3 and in no acute distress Ortho Exam Examination of his right knee does show some slight varus malalignment.  There is a mild effusion.  He hurts when I flex him past 90 degrees and has a positive McMurray's exam to the medial compartment.  He has pain to palpation along the MCL and the posterior medial corner of his knee.  His Lachman's exam is negative. Specialty Comments:  No specialty comments available.  Imaging: XR Knee 1-2 Views Right  Result Date: 10/26/2019 2 views of the right knee show no acute findings.  The joint space and alignment is well-maintained.  There is no  effusion.    PMFS History: Patient Active Problem List   Diagnosis Date Noted  . OBESITY-MORBID (>100') 07/30/2009  . SYNCOPE AND COLLAPSE 10/29/2008  . OBSTRUCTIVE SLEEP APNEA 09/28/2008  . HYPERLIPIDEMIA-MIXED 07/12/2008  . HYPERTENSION, BENIGN 07/12/2008  . CHEST PAIN-UNSPECIFIED 07/12/2008   Past Medical History:  Diagnosis Date  . Chest pressure    cardiac cath; minimal non-obs CAD and normal EF 06/2008  . Depression   . Diabetes mellitus    x5 years  . Family history of anesthesia complication    mother n/v  . Glaucoma   . Headache(784.0)    vascular headaches  . History of peptic ulcer disease   . Hyperlipidemia   . Hypertension    s/p syncopal episode with facial trauma due to orthostasis 10/2008. diuretic held  . Obesity   . Syncopal episodes    likely due to orthostatic hypotension    Family History  Problem Relation Age of Onset  . Heart disease Mother 38       CABG  . Cancer Father        lung  . Heart disease Father 31       CABG    Past Surgical History:  Procedure Laterality Date  . left knee surgery    . NASAL HEMORRHAGE CONTROL N/A 03/24/2013   Procedure: EPISTAXIS CONTROL;  Surgeon: [name], MD;  Location: [facility] OR;  Service: ENT;  Laterality: N/A;  . NASAL SEPTOPLASTY W/ TURBINOPLASTY N/A 03/24/2013   Procedure: NASAL SEPTOPLASTY WITH TURBINATE REDUCTION;  Surgeon: [name], MD;  Location: [facility] OR;  Service: ENT;  Laterality: N/A;  . NECK SURGERY     C5-C6  . vastectomy     Social History   Occupational History  . Not on file  Tobacco Use  . Smoking status: Former Smoker    Packs/day: 1.00    Years: 10.00    Pack years: 10.00    Quit date: 08/10/1982    Years since quitting: 37.2  Substance and Sexual Activity  . Alcohol use: No  . Drug use: No  . Sexual activity: Not on file

## 2019-10-30 MED FILL — ONETOUCH VERIO TEST STRIP: 30 days supply | Qty: 100 | Fill #0

## 2019-10-31 MED FILL — LANTUS SOLOSTAR 100 UNITS/M: 100 | 30 days supply | Qty: 18 | Fill #1

## 2019-11-02 ENCOUNTER — Other Ambulatory Visit: Payer: Self-pay

## 2019-11-02 ENCOUNTER — Telehealth: Payer: Self-pay | Admitting: Orthopaedic Surgery

## 2019-11-02 ENCOUNTER — Other Ambulatory Visit: Payer: Self-pay | Admitting: Radiology

## 2019-11-02 ENCOUNTER — Encounter: Payer: Self-pay | Admitting: Orthopaedic Surgery

## 2019-11-02 DIAGNOSIS — G8929 Other chronic pain: Secondary | ICD-10-CM

## 2019-11-02 NOTE — Telephone Encounter (Signed)
Canceled left knee order.  Attempted to run new authorization in [facility], however stated exam had already been submitted in system, did not get confirmation number.

## 2019-11-02 NOTE — Telephone Encounter (Signed)
Can we change the order  

## 2019-11-02 NOTE — Telephone Encounter (Signed)
Pt called in stating Dr. Magnus Ivan put in an order for him to have a MRI on his right knee but when the pt called [facility] the order was for the left knee and they told him he needed to call our office to get it fixed.    [phone]

## 2019-11-05 ENCOUNTER — Encounter: Payer: Self-pay | Admitting: Orthopaedic Surgery

## 2019-11-06 ENCOUNTER — Ambulatory Visit: Payer: Self-pay | Attending: Internal Medicine

## 2019-11-06 ENCOUNTER — Encounter: Payer: Self-pay | Admitting: Orthopaedic Surgery

## 2019-11-06 DIAGNOSIS — Z23 Encounter for immunization: Secondary | ICD-10-CM

## 2019-11-08 ENCOUNTER — Other Ambulatory Visit: Payer: Self-pay | Admitting: Orthopaedic Surgery

## 2019-11-08 MED FILL — HYDROCHLOROTHIAZIDE 25 MG T: 25 | 30 days supply | Qty: 15 | Fill #2

## 2019-11-08 MED FILL — LISINOPRIL 40 MG TABLET: 40 | 30 days supply | Qty: 30 | Fill #1

## 2019-11-08 MED FILL — ROSUVASTATIN CALCIUM 5 MG T: 5 | 30 days supply | Qty: 30 | Fill #1

## 2019-11-08 MED FILL — CARVEDILOL 25 MG TABLET: 25 | 30 days supply | Qty: 60 | Fill #0

## 2019-11-08 NOTE — Telephone Encounter (Signed)
Looks like blackman did one short course of norco.  No refill on this

## 2019-11-08 NOTE — Telephone Encounter (Signed)
Please advise. Dr. Magnus Ivan and Bronson Curb are not in office this week. Thanks!

## 2019-11-09 ENCOUNTER — Encounter: Payer: Self-pay | Admitting: Orthopaedic Surgery

## 2019-11-09 ENCOUNTER — Other Ambulatory Visit: Payer: Self-pay | Admitting: Orthopaedic Surgery

## 2019-11-09 ENCOUNTER — Telehealth: Payer: Self-pay | Admitting: Radiology

## 2019-11-09 ENCOUNTER — Telehealth: Payer: Self-pay | Admitting: Orthopaedic Surgery

## 2019-11-09 ENCOUNTER — Other Ambulatory Visit: Payer: Self-pay | Admitting: Physician Assistant

## 2019-11-09 MED ORDER — HYDROCODONE-ACETAMINOPHEN 5-325 MG PO TABS: 1.0000 | ORAL_TABLET | Freq: Every day | ORAL | 0 refills | Status: DC | PRN

## 2019-11-09 NOTE — Telephone Encounter (Signed)
Notified patient that pain medication was sent into pharmacy.

## 2019-11-09 NOTE — Telephone Encounter (Signed)
Spoke with patient. Sent request to [facility].

## 2019-11-09 NOTE — Telephone Encounter (Signed)
Patient called requesting a refill of hydrocodone. Please send to pharmacy on file. Patient phone number is [phone]

## 2019-11-09 NOTE — Telephone Encounter (Signed)
Patient called requesting a refill of hydrocodone. Please send to pharmacy on file.   Spoke with patient advising request was denied due to for one short course. Patient explained Dr. Magnus Ivan prescribed until MRI was done, however MRI is not scheduled until 4/13. Delayed scheduling due to original placed order was incorrect side. Patient is in pain.  Please advise.    Patient phone number is [phone]

## 2019-11-15 MED FILL — AMLODIPINE BESYLATE 5 MG TA: 5 | 30 days supply | Qty: 30 | Fill #1

## 2019-11-15 MED FILL — FLUoxetine HCL 40 MG CAPS: 40 | 30 days supply | Qty: 30 | Fill #3

## 2019-11-15 MED FILL — FENOFIBRATE 160 MG TABLET: 160 | 30 days supply | Qty: 30 | Fill #2

## 2019-11-16 ENCOUNTER — Ambulatory Visit: Payer: 59 | Admitting: Orthopaedic Surgery

## 2019-11-21 ENCOUNTER — Ambulatory Visit
Admission: RE | Admit: 2019-11-21 | Discharge: 2019-11-21 | Disposition: A | Discharge status: Home / Self Care | Payer: 59 | Source: Ambulatory Visit | Attending: Orthopaedic Surgery | Admitting: Orthopaedic Surgery

## 2019-11-21 ENCOUNTER — Other Ambulatory Visit: Payer: Self-pay

## 2019-11-21 DIAGNOSIS — M25561 Pain in right knee: Secondary | ICD-10-CM

## 2019-11-21 DIAGNOSIS — G8929 Other chronic pain: Secondary | ICD-10-CM

## 2019-11-23 ENCOUNTER — Other Ambulatory Visit: Payer: Self-pay

## 2019-11-23 ENCOUNTER — Encounter: Payer: Self-pay | Admitting: Orthopaedic Surgery

## 2019-11-23 ENCOUNTER — Ambulatory Visit (INDEPENDENT_AMBULATORY_CARE_PROVIDER_SITE_OTHER): Payer: 59 | Admitting: Orthopaedic Surgery

## 2019-11-23 DIAGNOSIS — M1711 Unilateral primary osteoarthritis, right knee: Secondary | ICD-10-CM

## 2019-11-23 MED ORDER — HYDROCODONE-ACETAMINOPHEN 5-325 MG PO TABS: 1.0000 | ORAL_TABLET | Freq: Four times a day (QID) | ORAL | 0 refills | Status: DC | PRN

## 2019-11-23 MED FILL — HYDROCODON-APAP 5-325: 5-325 | 5 days supply | Qty: 40 | Fill #0

## 2019-11-23 MED FILL — GLIMEPIRIDE 4 MG TABLET: 4 | 30 days supply | Qty: 60 | Fill #2

## 2019-11-23 MED FILL — PANTOPRAZOLE SOD DR 40 MG T: 40 | 31 days supply | Qty: 31 | Fill #1

## 2019-11-23 MED FILL — KOMBIGLYZE XR 2.5-1,000 MG: 2.5-1000 | 30 days supply | Qty: 60 | Fill #2

## 2019-11-23 NOTE — Progress Notes (Signed)
Office Visit Note   Patient: Tony Morrison           Date of Birth: 10-26-1955           MRN: [id number] Visit Date: 11/23/2019              Requested by: Nila Nephew, MD [address] [place], [address] [place],  [place] [address] PCP: [name], MD   Assessment & Plan: Visit Diagnoses:  1. Unilateral primary osteoarthritis, right knee     Plan: Given the extent of cartilage loss in his knee combined with the significant meniscal tear, we feel that at this point a total knee arthroplasty is more warranted.  He has significant cartilage loss in all 3 compartments.  Arthroscopic intervention could certainly help with his mechanical symptoms of locking and catching given the large flap component of the tear, however he has such extensive arthritic changes that he would still have significant knee pain.  I went over knee replacement with him in detail.  I showed him a knee replacement model and discussed with the surgery involves from the interoperative and postoperative standpoint.  Also discussed in detail the risk and benefits of surgery.  He understands this will certainly be a significant recovery in terms of the therapy that he will need afterwards.  I will send in some more hydrocodone for pain while we await scheduling the surgery.  All questions and concerns were answered and addressed.  I would likely need him out of work from anywhere from his little 6 weeks to up to 3 months as he rehabilitates his knee following this surgery.  Follow-Up Instructions: Return for 2 weeks post-op.   Orders:  No orders of the defined types were placed in this encounter.  Meds ordered this encounter  Medications  . HYDROcodone-acetaminophen (NORCO/VICODIN) 5-325 MG tablet    Sig: Take 1-2 tablets by mouth every 6 (six) hours as needed for moderate pain.    Dispense:  40 tablet    Refill:  0      Procedures: No procedures performed   Clinical Data: No additional findings.    Subjective: Chief Complaint  Patient presents with  . Right Knee - Follow-up  [name] comes in today to go over the MRI of his right knee.  He still been having a lot of right knee pain with locking and catching.  He had actually had a mechanical fall more recently injuring that right knee.  He actually had an MRI of the right knee several years ago that did not show any significant findings and he had done quite well until this most recent injury to his right knee. With continued knee pain as well as locking catching and swelling, I did feel a MRI was prudent.  He had tried and failed other forms conservative treatment including wearing a brace and activity modification and a steroid injection.  Also his plain films showed well-maintained joint space with just some slight varus malalignment and slight medial joint space narrowing.  He is here today to go over the MRI and to go over a treatment plan based on what our findings are.  He is still reporting daily knee pain with locking and catching and swelling.  It is mainly the medial joint line that hurts.  He does report recent calf pain and the MRI does note that he has a ruptured Baker's cyst.  HPI  Review of Systems He currently denies any headache, chest pain, shortness of  breath, fever, chills, nausea, vomiting  Objective: Vital Signs: There were no vitals taken for this visit.  Physical Exam He is alert and orient x3 and in no acute distress Ortho Exam Examination of his right knee does show positive McMurray sign to the medial compartment.  He has significant medial and posterior medial tenderness of the knee joint.  Once I flex his right knee past 90 degrees he gets a lot of pain in that knee.  There is varus malalignment is correctable.  He does have significant patellofemoral crepitation. Specialty Comments:  No specialty comments available.  Imaging: No results found. The MRI is reviewed with him.  There is a large complex medial  meniscal tear that is almost basically shredded.  There is flap components of this tear as well.  There is a ruptured Baker's cyst on that side of the knee as well.  There is extensive areas of partial near full-thickness cartilage loss throughout the medial compartment of his knee with subchondral cystic changes and subchondral edema.  There is moderate cartilage thinning of the lateral compartment and there is advanced degenerative change at the patellofemoral joint as well.  PMFS History: Patient Active Problem List   Diagnosis Date Noted  . Unilateral primary osteoarthritis, right knee 11/23/2019  . OBESITY-MORBID (>100') 07/30/2009  . SYNCOPE AND COLLAPSE 10/29/2008  . OBSTRUCTIVE SLEEP APNEA 09/28/2008  . HYPERLIPIDEMIA-MIXED 07/12/2008  . HYPERTENSION, BENIGN 07/12/2008  . CHEST PAIN-UNSPECIFIED 07/12/2008   Past Medical History:  Diagnosis Date  . Chest pressure    cardiac cath; minimal non-obs CAD and normal EF 06/2008  . Depression   . Diabetes mellitus    x5 years  . Family history of anesthesia complication    mother n/v  . Glaucoma   . Headache(784.0)    vascular headaches  . History of peptic ulcer disease   . Hyperlipidemia   . Hypertension    s/p syncopal episode with facial trauma due to orthostasis 10/2008. diuretic held  . Obesity   . Syncopal episodes    likely due to orthostatic hypotension    Family History  Problem Relation Age of Onset  . Heart disease Mother 50       CABG  . Cancer Father        lung  . Heart disease Father 31       CABG    Past Surgical History:  Procedure Laterality Date  . left knee surgery    . NASAL HEMORRHAGE CONTROL N/A 03/24/2013   Procedure: EPISTAXIS CONTROL;  Surgeon: [name], MD;  Location: [facility] OR;  Service: ENT;  Laterality: N/A;  . NASAL SEPTOPLASTY W/ TURBINOPLASTY N/A 03/24/2013   Procedure: NASAL SEPTOPLASTY WITH TURBINATE REDUCTION;  Surgeon: [name], MD;  Location: [facility] OR;  Service: ENT;  Laterality:  N/A;  . NECK SURGERY     C5-C6  . vastectomy     Social History   Occupational History  . Not on file  Tobacco Use  . Smoking status: Former Smoker    Packs/day: 1.00    Years: 10.00    Pack years: 10.00    Quit date: 08/10/1982    Years since quitting: 37.3  Substance and Sexual Activity  . Alcohol use: No  . Drug use: No  . Sexual activity: Not on file

## 2019-11-23 NOTE — Progress Notes (Deleted)
Office Visit Note   Patient: Tony Morrison           Date of Birth: 10-09-1955           MRN: [id number] Visit Date: 11/23/2019              Requested by: Nila Nephew, MD [address] [place], [address] [place],  [place] [address] PCP: [name], MD   Assessment & Plan: Visit Diagnoses: No diagnosis found.  Plan: ***  Follow-Up Instructions: Return for 2 weeks post-op.   Orders:  No orders of the defined types were placed in this encounter.  No orders of the defined types were placed in this encounter.     Procedures: No procedures performed   Clinical Data: No additional findings.   Subjective: Chief Complaint  Patient presents with  . Right Knee - Follow-up    HPI  Review of Systems   Objective: Vital Signs: There were no vitals taken for this visit.  Physical Exam  Ortho Exam  Specialty Comments:  No specialty comments available.  Imaging: No results found.   PMFS History: Patient Active Problem List   Diagnosis Date Noted  . OBESITY-MORBID (>100') 07/30/2009  . SYNCOPE AND COLLAPSE 10/29/2008  . OBSTRUCTIVE SLEEP APNEA 09/28/2008  . HYPERLIPIDEMIA-MIXED 07/12/2008  . HYPERTENSION, BENIGN 07/12/2008  . CHEST PAIN-UNSPECIFIED 07/12/2008   Past Medical History:  Diagnosis Date  . Chest pressure    cardiac cath; minimal non-obs CAD and normal EF 06/2008  . Depression   . Diabetes mellitus    x5 years  . Family history of anesthesia complication     mother n/v  . Glaucoma   . Headache(784.0)    vascular headaches  . History of peptic ulcer disease   . Hyperlipidemia   . Hypertension    s/p syncopal episode with facial trauma due to orthostasis 10/2008. diuretic held  . Obesity   . Syncopal episodes    likely due to orthostatic hypotension    Family History  Problem Relation Age of Onset  .  Heart disease Mother 65       CABG  . Cancer Father        lung  . Heart disease Father 34       CABG    Past Surgical History:  Procedure Laterality Date  . left knee surgery    . NASAL HEMORRHAGE CONTROL N/A 03/24/2013   Procedure: EPISTAXIS CONTROL;  Surgeon: [name], MD;  Location: [facility] OR;  Service: ENT;  Laterality: N/A;  . NASAL SEPTOPLASTY W/ TURBINOPLASTY N/A 03/24/2013   Procedure: NASAL SEPTOPLASTY WITH TURBINATE REDUCTION;  Surgeon: [name], MD;  Location: [facility] OR;  Service: ENT;  Laterality: N/A;  . NECK SURGERY     C5-C6  . vastectomy     Social History   Occupational History  . Not on file  Tobacco Use  . Smoking status: Former Smoker    Packs/day: 1.00    Years: 10.00    Pack years: 10.00    Quit date: 08/10/1982    Years since quitting: 37.3  Substance and Sexual Activity  . Alcohol use: No  . Drug use: No  . Sexual activity: Not on file

## 2019-11-27 ENCOUNTER — Encounter: Payer: Self-pay | Admitting: Orthopaedic Surgery

## 2019-11-28 ENCOUNTER — Encounter: Payer: Self-pay | Admitting: Orthopaedic Surgery

## 2019-12-02 ENCOUNTER — Other Ambulatory Visit: Payer: Self-pay | Admitting: Orthopaedic Surgery

## 2019-12-04 ENCOUNTER — Encounter: Payer: Self-pay | Admitting: Orthopaedic Surgery

## 2019-12-04 ENCOUNTER — Other Ambulatory Visit: Payer: Self-pay | Admitting: Orthopaedic Surgery

## 2019-12-04 ENCOUNTER — Ambulatory Visit: Payer: 59 | Attending: Internal Medicine

## 2019-12-04 DIAGNOSIS — Z23 Encounter for immunization: Secondary | ICD-10-CM

## 2019-12-04 MED ORDER — HYDROCODONE-ACETAMINOPHEN 5-325 MG PO TABS: 1.0000 | ORAL_TABLET | Freq: Four times a day (QID) | ORAL | 0 refills | Status: DC | PRN

## 2019-12-04 MED FILL — HYDROCODON-APAP 5-325: 5-325 | 5 days supply | Qty: 40 | Fill #0

## 2019-12-05 ENCOUNTER — Other Ambulatory Visit: Payer: Self-pay

## 2019-12-05 ENCOUNTER — Encounter (HOSPITAL_COMMUNITY): Payer: Self-pay

## 2019-12-05 ENCOUNTER — Other Ambulatory Visit: Payer: Self-pay | Admitting: Physician Assistant

## 2019-12-05 NOTE — Patient Instructions (Addendum)
DUE TO COVID-19 ONLY ONE VISITOR IS ALLOWED TO COME WITH YOU AND STAY IN THE WAITING ROOM ONLY DURING PRE OP AND PROCEDURE DAY OF SURGERY. TWO VISITOR MAY VISIT WITH YOU AFTER SURGERY IN YOUR PRIVATE ROOM DURING VISITING HOURS ONLY!  10a-8p  YOU NEED TO HAVE A COVID 19 TEST ON___5-4-21____ @__9 :10 am_____, THIS TEST MUST BE DONE BEFORE SURGERY, COME  [address], [place] , .  [facility]) ONCE YOUR COVID TEST IS COMPLETED, PLEASE BEGIN THE QUARANTINE INSTRUCTIONS AS OUTLINED IN YOUR HANDOUT.                Tony Morrison  12/05/2019   Your procedure is scheduled on: 12-15-19   Report to [facility] Main  Entrance   Report to admitting at      0600 AM     Call this number if you have problems the morning of surgery [phone]    Remember: NO SOLID FOOD AFTER MIDNIGHT THE NIGHT PRIOR TO SURGERY. NOTHING BY MOUTH EXCEPT CLEAR LIQUIDS UNTIL  0530 am . PLEASE FINISH G2 DRINK PER SURGEON ORDER  WHICH NEEDS TO BE COMPLETED AT    0530 am then nothing by mouth.    CLEAR LIQUID DIET   Foods Allowed                                                                                            Foods Excluded  Coffee and tea, regular and decaf  No creamer                                            liquids that you cannot  Plain Jell-O any favor except red or purple                                           see through such as: Fruit ices (not with fruit pulp)                                                                   milk, soups, orange juice  Iced Popsicles                                                                  All solid food Carbonated beverages, regular and diet  Cranberry, grape and apple juices Sports drinks like Gatorade Lightly seasoned clear broth or consume(fat free) Sugar, honey syrup   _____________________________________________________________________   BRUSH YOUR TEETH MORNING OF  SURGERY AND RINSE YOUR MOUTH OUT, NO CHEWING GUM CANDY OR MINTS.     Take these medicines the morning of surgery with A SIP OF WATER: crestor, protonix, fenofibrate, carvedilol, amlodipine, prozac  DO NOT TAKE ANY DIABETIC MEDICATIONS DAY OF YOUR SURGERY   How to Manage Your Diabetes Before and After Surgery  Why is it important to control my blood sugar before and after surgery? . Improving blood sugar levels before and after surgery helps healing and can limit problems. . A way of improving blood sugar control is eating a healthy diet by: o  Eating less sugar and carbohydrates o  Increasing activity/exercise o  Talking with your doctor about reaching your blood sugar goals . High blood sugars (greater than 180 mg/dL) can raise your risk of infections and slow your recovery, so you will need to focus on controlling your diabetes during the weeks before surgery. . Make sure that the doctor who takes care of your diabetes knows about your planned surgery including the date and location.  How do I manage my blood sugar before surgery? . Check your blood sugar at least 4 times a day, starting 2 days before surgery, to make sure that the level is not too high or low. o Check your blood sugar the morning of your surgery when you wake up and every 2 hours until you get to the Short Stay unit. . If your blood sugar is less than 70 mg/dL, you will need to treat for low blood sugar: o Do not take insulin. o Treat a low blood sugar (less than 70 mg/dL) with  cup of clear juice (cranberry or apple), 4 glucose tablets, OR glucose gel. o Recheck blood sugar in 15 minutes after treatment (to make sure it is greater than 70 mg/dL). If your blood sugar is not greater than 70 mg/dL on recheck, call [phone] for further instructions. . Report your blood sugar to the short stay nurse when you get to Short Stay.  . If you are admitted to the hospital after surgery: o Your blood sugar will be checked by  the staff and you will probably be given insulin after surgery (instead of oral diabetes medicines) to make sure you have good blood sugar levels. o The goal for blood sugar control after surgery is 80-180 mg/dL.   WHAT DO I DO ABOUT MY DIABETES MEDICATION?  Marland Kitchen Do not take oral diabetes medicines (pills) the morning of surgery.  . THE NIGHT BEFORE SURGERY, take     Humalog as usual with supper none at bedtime    Glimepiride  only take morning dose day before surgery  None day of surgery  THE MORNING OF SURGERY, take  50 % of your lantus insulin  dose  . The day of surgery, do not take other diabetes injectables, including Byetta (exenatide), Bydureon (exenatide ER), Victoza (liraglutide), or Trulicity (dulaglutide).  . If your CBG is greater than 220 mg/dL, you may take  of your sliding scale  . (correction) dose of insulin.                     You may not have any metal on your body including hair pins and              piercings  Do not wear  jewelry, , lotions, powders or perfumes, deodorant                     Men may shave face and neck.   Do not bring valuables to the hospital. [place] IS NOT             RESPONSIBLE   FOR VALUABLES.  Contacts, dentures or bridgework may not be worn into surgery.      Patients discharged the day of surgery will not be allowed to drive home. IF YOU ARE HAVING SURGERY AND GOING HOME THE SAME DAY, YOU MUST HAVE AN ADULT TO DRIVE YOU HOME AND BE WITH YOU FOR 24 HOURS. YOU MAY GO HOME BY TAXI OR UBER OR ORTHERWISE, BUT AN ADULT MUST ACCOMPANY YOU HOME AND STAY WITH YOU FOR 24 HOURS.  Name and phone number of your driver:  Special Instructions: N/A              Please read over the following fact sheets you were given: _____________________________________________________________________            [facility] - Preparing for Surgery Before surgery, you can play an important role.  Because skin is not sterile, your skin needs to be as free  of germs as possible.  You can reduce the number of germs on your skin by washing with CHG (chlorahexidine gluconate) soap before surgery.  CHG is an antiseptic cleaner which kills germs and bonds with the skin to continue killing germs even after washing. Please DO NOT use if you have an allergy to CHG or antibacterial soaps.  If your skin becomes reddened/irritated stop using the CHG and inform your nurse when you arrive at Short Stay. Do not shave (including legs and underarms) for at least 48 hours prior to the first CHG shower.  You may shave your face/neck. Please follow these instructions carefully:  1.  Shower with CHG Soap the night before surgery and the  morning of Surgery.  2.  If you choose to wash your hair, wash your hair first as usual with your  normal  shampoo.  3.  After you shampoo, rinse your hair and body thoroughly to remove the  shampoo.                           4.  Use CHG as you would any other liquid soap.  You can apply chg directly  to the skin and wash                       Gently with a scrungie or clean washcloth.  5.  Apply the CHG Soap to your body ONLY FROM THE NECK DOWN.   Do not use on face/ open                           Wound or open sores. Avoid contact with eyes, ears mouth and genitals (private parts).                       Wash face,  Genitals (private parts) with your normal soap.             6.  Wash thoroughly, paying special attention to the area where your surgery  will be performed.  7.  Thoroughly rinse your body with warm water from the neck down.  8.  DO NOT shower/wash with your normal soap after using and rinsing off  the CHG Soap.                9.  Pat yourself dry with a clean towel.            10.  Wear clean pajamas.            11.  Place clean sheets on your bed the night of your first shower and do not  sleep with pets. Day of Surgery : Do not apply any lotions/deodorants the morning of surgery.  Please wear clean clothes to the  hospital/surgery center.  FAILURE TO FOLLOW THESE INSTRUCTIONS MAY RESULT IN THE CANCELLATION OF YOUR SURGERY PATIENT SIGNATURE_________________________________  NURSE SIGNATURE__________________________________  ________________________________________________________________________   Rogelia MireIncentive Spirometer  An incentive spirometer is a tool that can help keep your lungs clear and active. This tool measures how well you are filling your lungs with each breath. Taking long deep breaths may help reverse or decrease the chance of developing breathing (pulmonary) problems (especially infection) following:  A long period of time when you are unable to move or be active. BEFORE THE PROCEDURE   If the spirometer includes an indicator to show your best effort, your nurse or respiratory therapist will set it to a desired goal.  If possible, sit up straight or lean slightly forward. Try not to slouch.  Hold the incentive spirometer in an upright position. INSTRUCTIONS FOR USE  1. Sit on the edge of your bed if possible, or sit up as far as you can in bed or on a chair. 2. Hold the incentive spirometer in an upright position. 3. Breathe out normally. 4. Place the mouthpiece in your mouth and seal your lips tightly around it. 5. Breathe in slowly and as deeply as possible, raising the piston or the ball toward the top of the column. 6. Hold your breath for 3-5 seconds or for as long as possible. Allow the piston or ball to fall to the bottom of the column. 7. Remove the mouthpiece from your mouth and breathe out normally. 8. Rest for a few seconds and repeat Steps 1 through 7 at least 10 times every 1-2 hours when you are awake. Take your time and take a few normal breaths between deep breaths. 9. The spirometer may include an indicator to show your best effort. Use the indicator as a goal to work toward during each repetition. 10. After each set of 10 deep breaths, practice coughing to be sure  your lungs are clear. If you have an incision (the cut made at the time of surgery), support your incision when coughing by placing a pillow or rolled up towels firmly against it. Once you are able to get out of bed, walk around indoors and cough well. You may stop using the incentive spirometer when instructed by your caregiver.  RISKS AND COMPLICATIONS  Take your time so you do not get dizzy or light-headed.  If you are in pain, you may need to take or ask for pain medication before doing incentive spirometry. It is harder to take a deep breath if you are having pain. AFTER USE  Rest and breathe slowly and easily.  It can be helpful to keep track of a log of your progress. Your caregiver can provide you with a simple table to help with this. If you are using the spirometer at home, follow these instructions: SEEK MEDICAL CARE IF:   You are  having difficultly using the spirometer.  You have trouble using the spirometer as often as instructed.  Your pain medication is not giving enough relief while using the spirometer.  You develop fever of 100.5 F (38.1 C) or higher. SEEK IMMEDIATE MEDICAL CARE IF:   You cough up bloody sputum that had not been present before.  You develop fever of 102 F (38.9 C) or greater.  You develop worsening pain at or near the incision site. MAKE SURE YOU:   Understand these instructions.  Will watch your condition.  Will get help right away if you are not doing well or get worse. Document Released: 12/07/2006 Document Revised: 10/19/2011 Document Reviewed: 02/07/2007 Exit[employer] Patient Information 2014 ExitCare, [place].   ________________________________________________________________________  WHAT IS A BLOOD TRANSFUSION? Blood Transfusion Information  A transfusion is the replacement of blood or some of its parts. Blood is made up of multiple cells which provide different functions.  Red blood cells carry oxygen and are used for blood loss  replacement.  White blood cells fight against infection.  Platelets control bleeding.  Plasma helps clot blood.  Other blood products are available for specialized needs, such as hemophilia or other clotting disorders. BEFORE THE TRANSFUSION  Who gives blood for transfusions?   Healthy volunteers who are fully evaluated to make sure their blood is safe. This is blood bank blood. Transfusion therapy is the safest it has ever been in the practice of medicine. Before blood is taken from a donor, a complete history is taken to make sure that person has no history of diseases nor engages in risky social behavior (examples are intravenous drug use or sexual activity with multiple partners). The donor's travel history is screened to minimize risk of transmitting infections, such as malaria. The donated blood is tested for signs of infectious diseases, such as HIV and hepatitis. The blood is then tested to be sure it is compatible with you in order to minimize the chance of a transfusion reaction. If you or a relative donates blood, this is often done in anticipation of surgery and is not appropriate for emergency situations. It takes many days to process the donated blood. RISKS AND COMPLICATIONS Although transfusion therapy is very safe and saves many lives, the main dangers of transfusion include:   Getting an infectious disease.  Developing a transfusion reaction. This is an allergic reaction to something in the blood you were given. Every precaution is taken to prevent this. The decision to have a blood transfusion has been considered carefully by your caregiver before blood is given. Blood is not given unless the benefits outweigh the risks. AFTER THE TRANSFUSION  Right after receiving a blood transfusion, you will usually feel much better and more energetic. This is especially true if your red blood cells have gotten low (anemic). The transfusion raises the level of the red blood cells which  carry oxygen, and this usually causes an energy increase.  The nurse administering the transfusion will monitor you carefully for complications. HOME CARE INSTRUCTIONS  No special instructions are needed after a transfusion. You may find your energy is better. Speak with your caregiver about any limitations on activity for underlying diseases you may have. SEEK MEDICAL CARE IF:   Your condition is not improving after your transfusion.  You develop redness or irritation at the intravenous (IV) site. SEEK IMMEDIATE MEDICAL CARE IF:  Any of the following symptoms occur over the next 12 hours:  Shaking chills.  You have a temperature by mouth  above 102 F (38.9 C), not controlled by medicine.  Chest, back, or muscle pain.  People around you feel you are not acting correctly or are confused.  Shortness of breath or difficulty breathing.  Dizziness and fainting.  You get a rash or develop hives.  You have a decrease in urine output.  Your urine turns a dark color or changes to pink, red, or brown. Any of the following symptoms occur over the next 10 days:  You have a temperature by mouth above 102 F (38.9 C), not controlled by medicine.  Shortness of breath.  Weakness after normal activity.  The white part of the eye turns yellow (jaundice).  You have a decrease in the amount of urine or are urinating less often.  Your urine turns a dark color or changes to pink, red, or brown. Document Released: 07/24/2000 Document Revised: 10/19/2011 Document Reviewed: 03/12/2008 [facility] Patient Information 2014 [place], [place].  _______________________________________________________________________

## 2019-12-06 ENCOUNTER — Encounter (HOSPITAL_COMMUNITY)
Admission: RE | Admit: 2019-12-06 | Discharge: 2019-12-06 | Disposition: A | Discharge status: Home / Self Care | Payer: 59 | Source: Ambulatory Visit | Attending: Orthopaedic Surgery | Admitting: Orthopaedic Surgery

## 2019-12-06 ENCOUNTER — Encounter (HOSPITAL_COMMUNITY): Payer: Self-pay

## 2019-12-06 ENCOUNTER — Other Ambulatory Visit: Payer: Self-pay

## 2019-12-06 DIAGNOSIS — K219 Gastro-esophageal reflux disease without esophagitis: Secondary | ICD-10-CM | POA: Insufficient documentation

## 2019-12-06 DIAGNOSIS — E118 Type 2 diabetes mellitus with unspecified complications: Secondary | ICD-10-CM | POA: Insufficient documentation

## 2019-12-06 DIAGNOSIS — F419 Anxiety disorder, unspecified: Secondary | ICD-10-CM | POA: Diagnosis not present

## 2019-12-06 DIAGNOSIS — M1711 Unilateral primary osteoarthritis, right knee: Secondary | ICD-10-CM | POA: Diagnosis not present

## 2019-12-06 DIAGNOSIS — I10 Essential (primary) hypertension: Secondary | ICD-10-CM | POA: Insufficient documentation

## 2019-12-06 DIAGNOSIS — Z87891 Personal history of nicotine dependence: Secondary | ICD-10-CM | POA: Insufficient documentation

## 2019-12-06 DIAGNOSIS — Z01818 Encounter for other preprocedural examination: Secondary | ICD-10-CM | POA: Diagnosis not present

## 2019-12-06 DIAGNOSIS — Z7901 Long term (current) use of anticoagulants: Secondary | ICD-10-CM | POA: Diagnosis not present

## 2019-12-06 DIAGNOSIS — E785 Hyperlipidemia, unspecified: Secondary | ICD-10-CM | POA: Diagnosis not present

## 2019-12-06 DIAGNOSIS — Z6838 Body mass index (BMI) 38.0-38.9, adult: Secondary | ICD-10-CM | POA: Diagnosis not present

## 2019-12-06 DIAGNOSIS — F329 Major depressive disorder, single episode, unspecified: Secondary | ICD-10-CM | POA: Diagnosis not present

## 2019-12-06 DIAGNOSIS — Z79899 Other long term (current) drug therapy: Secondary | ICD-10-CM | POA: Insufficient documentation

## 2019-12-06 DIAGNOSIS — E669 Obesity, unspecified: Secondary | ICD-10-CM | POA: Diagnosis not present

## 2019-12-06 HISTORY — DX: Cardiac murmur, unspecified: R01.1

## 2019-12-06 HISTORY — DX: Other specified postprocedural states: Z98.890

## 2019-12-06 HISTORY — DX: Nausea with vomiting, unspecified: R11.2

## 2019-12-06 HISTORY — DX: Anxiety disorder, unspecified: F41.9

## 2019-12-06 HISTORY — DX: Sleep apnea, unspecified: G47.30

## 2019-12-06 HISTORY — DX: Gastro-esophageal reflux disease without esophagitis: K21.9

## 2019-12-06 HISTORY — DX: Unspecified osteoarthritis, unspecified site: M19.90

## 2019-12-06 MED FILL — LANTUS SOLOSTAR 100 UNITS/M: 100 | 30 days supply | Qty: 18 | Fill #0

## 2019-12-06 NOTE — Progress Notes (Signed)
PCP - Aurther Loft new to pt. First visit 4-29 0800 . Saw Dr. [name] at [facility] prior  Cardiologist - saw Dr. Nicholes Mango 5+ years ago  Per pt  2012 lov note epic  Followed for bp  Chest x-ray -  EKG - 2-29-21 epic Stress Test -  ECHO -  Cardiac Cath -   Sleep Study -  CPAP -   Fasting Blood Sugar - 200-216 Checks Blood Sugar ___1__ times a day  Blood Thinner Instructions: Aspirin Instructions: Last Dose:  Anesthesia review: osa does not tolerate cpap, hgba1c 8.7  Patient denies shortness of breath, fever, cough and chest pain at PAT appointment   none  Patient verbalized understanding of instructions that were given to them at the PAT appointment. Patient was also instructed that they will need to review over the PAT instructions again at home before surgery.

## 2019-12-07 ENCOUNTER — Encounter (HOSPITAL_COMMUNITY)
Admission: RE | Admit: 2019-12-07 | Discharge: 2019-12-07 | Disposition: A | Discharge status: Home / Self Care | Payer: 59 | Source: Ambulatory Visit | Attending: Orthopaedic Surgery | Admitting: Orthopaedic Surgery

## 2019-12-07 DIAGNOSIS — I10 Essential (primary) hypertension: Secondary | ICD-10-CM | POA: Diagnosis not present

## 2019-12-07 DIAGNOSIS — K219 Gastro-esophageal reflux disease without esophagitis: Secondary | ICD-10-CM | POA: Diagnosis not present

## 2019-12-07 DIAGNOSIS — M1711 Unilateral primary osteoarthritis, right knee: Secondary | ICD-10-CM | POA: Diagnosis not present

## 2019-12-07 DIAGNOSIS — Z01818 Encounter for other preprocedural examination: Secondary | ICD-10-CM | POA: Diagnosis not present

## 2019-12-07 LAB — ABO/RH: ABO/RH(D): A NEG

## 2019-12-07 LAB — BASIC METABOLIC PANEL
Anion gap: 10 (ref 5–15)
BUN: 14 mg/dL (ref 8–23)
CO2: 24 mmol/L (ref 22–32)
Calcium: 9.1 mg/dL (ref 8.9–10.3)
Chloride: 104 mmol/L (ref 98–111)
Creatinine, Ser: 0.87 mg/dL (ref 0.61–1.24)
GFR calc Af Amer: >60 mL/min (ref >60)
GFR calc non Af Amer: >60 mL/min (ref >60)
Glucose, Bld: 199 mg/dL — ABNORMAL HIGH (ref 70–99)
Potassium: 4.2 mmol/L (ref 3.5–5.1)
Sodium: 138 mmol/L (ref 135–145)

## 2019-12-07 LAB — CBC
HCT: 35.1 % — ABNORMAL LOW (ref 39.0–52.0)
Hemoglobin: 10.5 g/dL — ABNORMAL LOW (ref 13.0–17.0)
MCH: 23.6 pg — ABNORMAL LOW (ref 26.0–34.0)
MCHC: 29.9 g/dL — ABNORMAL LOW (ref 30.0–36.0)
MCV: 79.1 fL — ABNORMAL LOW (ref 80.0–100.0)
Platelets: 218 10*3/uL (ref 150–400)
RBC: 4.44 MIL/uL (ref 4.22–5.81)
RDW: 16.1 % — ABNORMAL HIGH (ref 11.5–15.5)
WBC: 4.2 10*3/uL (ref 4.0–10.5)
nRBC: 0 % (ref 0.0–0.2)

## 2019-12-07 LAB — HEMOGLOBIN A1C
Hgb A1c MFr Bld: 8.7 % — ABNORMAL HIGH (ref 4.8–5.6)
Mean Plasma Glucose: 202.99 mg/dL

## 2019-12-07 LAB — SURGICAL PCR SCREEN
MRSA, PCR: NEGATIVE
Staphylococcus aureus: NEGATIVE

## 2019-12-07 LAB — GLUCOSE, CAPILLARY: Glucose-Capillary: 183 mg/dL — ABNORMAL HIGH (ref 70–99)

## 2019-12-08 ENCOUNTER — Encounter (HOSPITAL_COMMUNITY): Payer: Self-pay | Admitting: Physician Assistant

## 2019-12-08 NOTE — Progress Notes (Signed)
Anesthesia Chart Review   Case: [id number] Date/Time: 12/15/19 0815   Procedure: RIGHT TOTAL KNEE ARTHROPLASTY (Right Knee)   Anesthesia type: Spinal   Pre-op diagnosis: Osteoarthritis Right Knee   Location: [facility] ROOM 10 / [facility]   Surgeons: Kathryne Hitch, MD      DISCUSSION:64 y.o. former smoker (10 pack years, quit 08/10/82) with h/o PONV, HLD, sleep apnea, GERD, HTN, DM II, right knee OA scheduled for above procedure 12/15/2019 with Dr. Doneen Poisson.   A1C 8.7, surgeon made aware.   Last seen by PCP 12/07/2019.  Insulin increased at this visit for better DM control, glucometer readings at home averaging 208 and over.   VS: BP (!) 164/75 (BP Location: Right Arm)   Pulse 60   Temp 36.7 C (Oral)   Resp 18   Ht 5\' 8"  (1.727 m)   Wt 115.4 kg   SpO2 98%   BMI 38.70 kg/m   PROVIDERS: , MD is PCP    LABS: Surgeon made aware of A1C (all labs ordered are listed, but only abnormal results are displayed)  Labs Reviewed  BASIC METABOLIC PANEL - Abnormal; Notable for the following components:      Result Value   Glucose, Bld 199 (*)    All other components within normal limits  CBC - Abnormal; Notable for the following components:   Hemoglobin 10.5 (*)    HCT 35.1 (*)    MCV 79.1 (*)    MCH 23.6 (*)    MCHC 29.9 (*)    RDW 16.1 (*)    All other components within normal limits  HEMOGLOBIN A1C - Abnormal; Notable for the following components:   Hgb A1c MFr Bld 8.7 (*)    All other components within normal limits  GLUCOSE, CAPILLARY - Abnormal; Notable for the following components:   Glucose-Capillary 183 (*)    All other components within normal limits  SURGICAL PCR SCREEN  TYPE AND SCREEN  ABO/RH     IMAGES:   EKG: 12/07/2019 Rate 60 bpm Normal sinus rhythm  Incomplete right bundle branch block   CV:  Past Medical History:  Diagnosis Date  . Anxiety   . Arthritis   . Chest pressure    cardiac cath; minimal non-obs CAD and normal  EF 06/2008  . Depression   . Diabetes mellitus    x5 years  type 2  . Family history of anesthesia complication    mother n/v  . GERD (gastroesophageal reflux disease)   . Glaucoma   . Headache(784.0)     headaches   Cluster  . Heart murmur    slight per pcp that is retired 07/2008  no issues   . History of peptic ulcer disease   . Hyperlipidemia   . Hypertension    s/p syncopal episode with facial trauma due to orthostasis 10/2008. diuretic held  . Obesity   . PONV (postoperative nausea and vomiting)    once  . Sleep apnea    dont use CPAP  . Syncopal episodes    likely due to orthostatic hypotension    Past Surgical History:  Procedure Laterality Date  . left knee surgery     medial meniscus repair x2  . MOUTH SURGERY     to make palate wider  . NASAL HEMORRHAGE CONTROL N/A 03/24/2013   Procedure: EPISTAXIS CONTROL;  Surgeon: 03/26/2013, MD;  Location: [facility] OR;  Service: ENT;  Laterality: N/A;  . NASAL SEPTOPLASTY W/ TURBINOPLASTY N/A  03/24/2013   Procedure: NASAL SEPTOPLASTY WITH TURBINATE REDUCTION;  Surgeon: Jodi Marble, MD;  Location: [facility];  Service: ENT;  Laterality: N/A;  . NECK SURGERY     C5-C6  . vastectomy      MEDICATIONS: . ALPRAZolam (XANAX) 0.5 MG tablet  . amLODipine (NORVASC) 5 MG tablet  . aspirin EC 81 MG tablet  . carvedilol (COREG) 25 MG tablet  . fenofibrate 160 MG tablet  . FLUoxetine (PROZAC) 40 MG capsule  . glimepiride (AMARYL) 4 MG tablet  . HUMALOG KWIKPEN 100 UNIT/ML KwikPen  . hydrochlorothiazide (HYDRODIURIL) 25 MG tablet  . HYDROcodone-acetaminophen (NORCO/VICODIN) 5-325 MG tablet  . ibuprofen (ADVIL) 200 MG tablet  . KOMBIGLYZE XR 2.12-998 MG TB24  . LANTUS SOLOSTAR 100 UNIT/ML Solostar Pen  . lisinopril (PRINIVIL,ZESTRIL) 40 MG tablet  . pantoprazole (PROTONIX) 40 MG tablet  . rosuvastatin (CRESTOR) 5 MG tablet  . saxagliptin HCl (ONGLYZA) 5 MG TABS tablet   No current facility-administered medications for this  encounter.    Maia Plan [facility] Pre-Surgical Testing [phone] 12/08/19  11:53 AM

## 2019-12-11 ENCOUNTER — Telehealth: Payer: Self-pay

## 2019-12-11 ENCOUNTER — Other Ambulatory Visit: Payer: Self-pay | Admitting: Orthopaedic Surgery

## 2019-12-11 MED ORDER — GABAPENTIN 100 MG PO CAPS: 100.0000 mg | ORAL_CAPSULE | Freq: Three times a day (TID) | ORAL | 1 refills | Status: DC

## 2019-12-11 MED FILL — CARVEDILOL 25 MG TABLET: 25 | 30 days supply | Qty: 60 | Fill #1

## 2019-12-11 MED FILL — HYDROCHLOROTHIAZIDE 25 MG T: 25 | 30 days supply | Qty: 15 | Fill #3

## 2019-12-11 MED FILL — ROSUVASTATIN CALCIUM 5 MG T: 5 | 30 days supply | Qty: 30 | Fill #2

## 2019-12-11 MED FILL — GABAPENTIN 100 MG CAPSULE: 100 | 30 days supply | Qty: 90 | Fill #0

## 2019-12-11 MED FILL — LISINOPRIL 40 MG TABLET: 40 | 30 days supply | Qty: 30 | Fill #2

## 2019-12-11 NOTE — Telephone Encounter (Signed)
I called patient to advise that A1c is 8.7 and too high for surgery.  He asked if you would call him to discuss.  Please call [phone].

## 2019-12-12 ENCOUNTER — Other Ambulatory Visit (HOSPITAL_COMMUNITY): Payer: Self-pay | Admitting: Family Medicine

## 2019-12-12 ENCOUNTER — Other Ambulatory Visit (HOSPITAL_COMMUNITY): Payer: 59

## 2019-12-12 MED FILL — AMLODIPINE BESYLATE 5 MG TA: 5 | 30 days supply | Qty: 30 | Fill #2

## 2019-12-12 MED FILL — HUMALOG 100 UNITS/ML KWIKPE: 100 | 30 days supply | Qty: 9 | Fill #0

## 2019-12-13 MED FILL — HYDROCODON-APAP 10-325: 10-325 | 15 days supply | Qty: 60 | Fill #0

## 2019-12-13 MED FILL — FLUoxetine HCL 40 MG CAPS: 40 | 30 days supply | Qty: 30 | Fill #4

## 2019-12-13 MED FILL — FENOFIBRATE 160 MG TABLET: 160 | 30 days supply | Qty: 30 | Fill #3

## 2019-12-15 ENCOUNTER — Encounter (HOSPITAL_COMMUNITY): Admission: RE | Payer: Self-pay | Source: Home / Self Care

## 2019-12-15 ENCOUNTER — Inpatient Hospital Stay (HOSPITAL_COMMUNITY): Admission: RE | Admit: 2019-12-15 | Payer: 59 | Source: Home / Self Care | Admitting: Orthopaedic Surgery

## 2019-12-15 LAB — TYPE AND SCREEN
ABO/RH(D): A NEG
Antibody Screen: NEGATIVE

## 2019-12-15 SURGERY — ARTHROPLASTY, KNEE, TOTAL
Anesthesia: Spinal | Site: Knee | Laterality: Right

## 2019-12-25 MED FILL — PANTOPRAZOLE SOD DR 40 MG T: 40 | 31 days supply | Qty: 31 | Fill #2

## 2019-12-25 MED FILL — KOMBIGLYZE XR 2.5-1,000 MG: 2.5-1000 | 30 days supply | Qty: 60 | Fill #3

## 2019-12-25 MED FILL — GLIMEPIRIDE 4 MG TABS: 4 | 30 days supply | Qty: 60 | Fill #3

## 2019-12-28 ENCOUNTER — Inpatient Hospital Stay: Payer: 59 | Admitting: Orthopaedic Surgery

## 2020-01-01 MED FILL — ALPRAZolam 0.5 MG TABS: 0.5 | 10 days supply | Qty: 30 | Fill #0

## 2020-01-02 MED FILL — LANTUS SOLOSTAR 100 UNITS/M: 100 | 30 days supply | Qty: 18 | Fill #1

## 2020-01-03 MED FILL — GABAPENTIN 100 MG CAPSULE: 100 | 30 days supply | Qty: 90 | Fill #0

## 2020-01-03 MED FILL — DOXYCYCLINE MONOHYDRATE 100: 100 | 7 days supply | Qty: 14 | Fill #0

## 2020-01-11 MED FILL — CARVEDILOL 25 MG TABLET: 25 | 30 days supply | Qty: 60 | Fill #2

## 2020-01-11 MED FILL — ROSUVASTATIN CALCIUM 5 MG T: 5 | 30 days supply | Qty: 30 | Fill #3

## 2020-01-11 MED FILL — HYDROCHLOROTHIAZIDE 25 MG T: 25 | 30 days supply | Qty: 15 | Fill #4

## 2020-01-11 MED FILL — AMLODIPINE BESYLATE 5 MG TA: 5 | 30 days supply | Qty: 30 | Fill #3

## 2020-01-12 MED FILL — FLUoxetine HCL 40 MG CAPS: 40 | 30 days supply | Qty: 30 | Fill #5

## 2020-01-12 MED FILL — FENOFIBRATE 160 MG TABLET: 160 | 30 days supply | Qty: 30 | Fill #4

## 2020-01-23 MED FILL — ONETOUCH VERIO TEST STRIP: 30 days supply | Qty: 100 | Fill #1

## 2020-01-23 MED FILL — LISINOPRIL 40 MG TABLET: 40 | 30 days supply | Qty: 30 | Fill #3

## 2020-01-23 MED FILL — GLIMEPIRIDE 4 MG TABS: 4 | 30 days supply | Qty: 60 | Fill #4

## 2020-01-23 MED FILL — HUMALOG 100 UNITS/ML KWIKPE: 100 | 27 days supply | Qty: 12 | Fill #3

## 2020-01-23 MED FILL — HYDROCODON-APAP 10-325: 10-325 | 15 days supply | Qty: 60 | Fill #0

## 2020-01-23 MED FILL — KOMBIGLYZE XR 2.5-1,000 MG: 2.5-1000 | 30 days supply | Qty: 60 | Fill #4

## 2020-01-23 MED FILL — PANTOPRAZOLE SOD DR 40 MG T: 40 | 31 days supply | Qty: 31 | Fill #3

## 2020-01-25 MED FILL — LANTUS SOLOSTAR 100 UNITS/M: 100 | 30 days supply | Qty: 18 | Fill #2

## 2020-01-30 ENCOUNTER — Encounter: Payer: Self-pay | Admitting: Orthopaedic Surgery

## 2020-02-02 MED FILL — GABAPENTIN 100 MG CAPSULE: 100 | 30 days supply | Qty: 90 | Fill #1

## 2020-02-12 MED FILL — AMLODIPINE BESYLATE 5 MG TA: 5 | 30 days supply | Qty: 30 | Fill #4

## 2020-02-12 MED FILL — CARVEDILOL 25 MG TABLET: 25 | 30 days supply | Qty: 60 | Fill #3

## 2020-02-12 MED FILL — HYDROCHLOROTHIAZIDE 25 MG T: 25 | 30 days supply | Qty: 15 | Fill #5

## 2020-02-12 MED FILL — ROSUVASTATIN CALCIUM 5 MG T: 5 | 30 days supply | Qty: 30 | Fill #4

## 2020-02-15 ENCOUNTER — Other Ambulatory Visit (HOSPITAL_COMMUNITY): Payer: Self-pay | Admitting: Family Medicine

## 2020-02-16 MED FILL — FLUoxetine HCL 40 MG CAPS: 40 | 30 days supply | Qty: 30 | Fill #0

## 2020-02-17 MED FILL — FENOFIBRATE 160 MG TABLET: 160 | 30 days supply | Qty: 30 | Fill #5

## 2020-02-20 MED FILL — PANTOPRAZOLE SOD DR 40 MG T: 40 | 31 days supply | Qty: 31 | Fill #4

## 2020-02-20 MED FILL — KOMBIGLYZE XR 2.5-1,000 MG: 2.5-1000 | 30 days supply | Qty: 60 | Fill #5

## 2020-02-20 MED FILL — GLIMEPIRIDE 4 MG TABS: 4 | 30 days supply | Qty: 60 | Fill #5

## 2020-02-20 MED FILL — LANTUS SOLOSTAR 100 UNITS/M: 100 | 30 days supply | Qty: 18 | Fill #0

## 2020-02-29 MED FILL — HUMALOG 100 UNITS/ML KWIKPE: 100 | 27 days supply | Qty: 12 | Fill #4

## 2020-02-29 MED FILL — LISINOPRIL 40 MG TABS: 40 | 30 days supply | Qty: 30 | Fill #4

## 2020-03-06 MED FILL — AMLODIPINE BESYLATE 5 MG TA: 5 | 30 days supply | Qty: 30 | Fill #5

## 2020-03-06 MED FILL — ROSUVASTATIN CALCIUM 5 MG T: 5 | 30 days supply | Qty: 30 | Fill #5

## 2020-03-11 MED FILL — CEPHALEXIN 500 MG CAPSULE: 500 | 5 days supply | Qty: 15 | Fill #0

## 2020-03-13 ENCOUNTER — Other Ambulatory Visit (HOSPITAL_COMMUNITY): Payer: Self-pay | Admitting: Family Medicine

## 2020-03-13 MED FILL — FLUoxetine HCL 40 MG CAPS: 40 | 30 days supply | Qty: 30 | Fill #1

## 2020-03-13 MED FILL — HYDROCHLOROTHIAZIDE 25 MG T: 25 | 30 days supply | Qty: 15 | Fill #6

## 2020-03-13 MED FILL — CARVEDILOL 25 MG TABLET: 25 | 30 days supply | Qty: 60 | Fill #4

## 2020-03-14 MED FILL — FENOFIBRATE 160 MG TABLET: 160 | 30 days supply | Qty: 30 | Fill #0

## 2020-03-14 MED FILL — LANTUS SOLOSTAR 100 UNITS/M: 100 | 30 days supply | Qty: 18 | Fill #0

## 2020-03-26 MED FILL — PANTOPRAZOLE SOD DR 40 MG T: 40 | 31 days supply | Qty: 31 | Fill #5

## 2020-03-26 MED FILL — KOMBIGLYZE XR 2.5-1,000 MG: 2.5-1000 | 30 days supply | Qty: 60 | Fill #6

## 2020-03-26 MED FILL — HYDROCODON-APAP 10-325: 10-325 | 15 days supply | Qty: 60 | Fill #0

## 2020-03-26 MED FILL — GLIMEPIRIDE 4 MG TABS: 4 | 30 days supply | Qty: 60 | Fill #6

## 2020-04-02 MED FILL — HUMALOG 100 UNITS/ML KWIKPE: 100 | 31 days supply | Qty: 12 | Fill #5

## 2020-04-03 MED FILL — LISINOPRIL 40 MG TABS: 40 | 30 days supply | Qty: 30 | Fill #5

## 2020-04-06 MED FILL — LANTUS SOLOSTAR 100 UNITS/M: 100 | 30 days supply | Qty: 18 | Fill #1

## 2020-04-11 MED FILL — ROSUVASTATIN CALCIUM 5 MG T: 5 | 30 days supply | Qty: 30 | Fill #6

## 2020-04-11 MED FILL — HYDROCHLOROTHIAZIDE 25 MG T: 25 | 30 days supply | Qty: 15 | Fill #7

## 2020-04-11 MED FILL — AMLODIPINE BESYLATE 5 MG TA: 5 | 30 days supply | Qty: 30 | Fill #6

## 2020-04-16 MED FILL — FENOFIBRATE 160 MG TABLET: 160 | 30 days supply | Qty: 30 | Fill #1

## 2020-04-16 MED FILL — CARVEDILOL 25 MG TABLET: 25 | 30 days supply | Qty: 60 | Fill #5

## 2020-04-16 MED FILL — FLUoxetine HCL 40 MG CAPS: 40 | 30 days supply | Qty: 30 | Fill #2

## 2020-04-23 MED FILL — GLIMEPIRIDE 4 MG TABS: 4 | 30 days supply | Qty: 60 | Fill #7

## 2020-04-23 MED FILL — PANTOPRAZOLE SOD DR 40 MG T: 40 | 31 days supply | Qty: 31 | Fill #6

## 2020-04-24 MED FILL — KOMBIGLYZE XR 2.5-1,000 MG: 2.5-1000 | 30 days supply | Qty: 60 | Fill #7

## 2020-04-27 MED FILL — GABAPENTIN 100 MG CAPSULE: 100 | 30 days supply | Qty: 90 | Fill #2

## 2020-05-01 MED FILL — ONETOUCH VERIO TEST STRIP: 30 days supply | Qty: 100 | Fill #2

## 2020-05-01 MED FILL — LANTUS SOLOSTAR 100 UNITS/M: 100 | 30 days supply | Qty: 18 | Fill #2

## 2020-05-08 MED FILL — LISINOPRIL 40 MG TABS: 40 | 30 days supply | Qty: 30 | Fill #6

## 2020-05-08 MED FILL — HUMALOG 100 UNITS/ML KWIKPE: 100 | 31 days supply | Qty: 12 | Fill #6

## 2020-05-13 ENCOUNTER — Other Ambulatory Visit (HOSPITAL_COMMUNITY): Payer: Self-pay | Admitting: Family Medicine

## 2020-05-13 MED FILL — FENOFIBRATE 160 MG TABLET: 160 | 30 days supply | Qty: 30 | Fill #2

## 2020-05-13 MED FILL — HYDROCHLOROTHIAZIDE 25 MG T: 25 | 30 days supply | Qty: 15 | Fill #8

## 2020-05-13 MED FILL — FLUoxetine HCL 40 MG CAPS: 40 | 30 days supply | Qty: 30 | Fill #3

## 2020-05-13 MED FILL — CARVEDILOL 25 MG TABLET: 25 | 30 days supply | Qty: 60 | Fill #6

## 2020-05-13 MED FILL — ROSUVASTATIN CALCIUM 5 MG T: 5 | 30 days supply | Qty: 30 | Fill #7

## 2020-05-13 MED FILL — AMLODIPINE BESYLATE 5 MG TA: 5 | 30 days supply | Qty: 30 | Fill #7

## 2020-05-14 MED FILL — ALPRAZolam 0.5 MG TABS: 0.5 | 10 days supply | Qty: 30 | Fill #0

## 2020-05-20 MED FILL — GLIMEPIRIDE 4 MG TABS: 4 | 30 days supply | Qty: 60 | Fill #8

## 2020-05-21 MED FILL — KOMBIGLYZE XR 2.5-1,000 MG: 2.5-1000 | 30 days supply | Qty: 60 | Fill #8

## 2020-05-21 MED FILL — PANTOPRAZOLE SOD DR 40 MG T: 40 | 31 days supply | Qty: 31 | Fill #7

## 2020-05-28 MED FILL — LANTUS SOLOSTAR 100 UNITS/M: 100 | 30 days supply | Qty: 18 | Fill #3

## 2020-06-02 ENCOUNTER — Other Ambulatory Visit (HOSPITAL_COMMUNITY): Payer: Self-pay | Admitting: Family Medicine

## 2020-06-03 MED FILL — UNIFINE PENTIPS 8MM 31G: 31G X 8 MM | 30 days supply | Qty: 100 | Fill #0

## 2020-06-05 MED FILL — AMLODIPINE BESYLATE 5 MG TA: 5 | 30 days supply | Qty: 30 | Fill #8

## 2020-06-05 MED FILL — LISINOPRIL 40 MG TABS: 40 | 30 days supply | Qty: 30 | Fill #7

## 2020-06-07 MED FILL — HUMALOG 100 UNITS/ML KWIKPE: 100 | 30 days supply | Qty: 9 | Fill #1

## 2020-06-13 MED FILL — FLUoxetine HCL 40 MG CAPS: 40 | 30 days supply | Qty: 30 | Fill #4

## 2020-06-13 MED FILL — FENOFIBRATE 160 MG TABLET: 160 | 30 days supply | Qty: 30 | Fill #3

## 2020-06-13 MED FILL — CARVEDILOL 25 MG TABLET: 25 | 30 days supply | Qty: 60 | Fill #7

## 2020-06-13 MED FILL — ROSUVASTATIN CALCIUM 5 MG T: 5 | 30 days supply | Qty: 30 | Fill #8

## 2020-06-13 MED FILL — HYDROCHLOROTHIAZIDE 25 MG T: 25 | 30 days supply | Qty: 15 | Fill #9

## 2020-06-19 ENCOUNTER — Other Ambulatory Visit (HOSPITAL_COMMUNITY): Payer: Self-pay | Admitting: Family Medicine

## 2020-06-20 MED FILL — LANTUS SOLOSTAR 100 UNITS/M: 100 | 30 days supply | Qty: 18 | Fill #0

## 2020-06-22 MED FILL — KOMBIGLYZE XR 2.5-1,000 MG: 2.5-1000 | 30 days supply | Qty: 60 | Fill #9

## 2020-06-22 MED FILL — GLIMEPIRIDE 4 MG TABS: 4 | 30 days supply | Qty: 60 | Fill #9

## 2020-06-22 MED FILL — PANTOPRAZOLE SOD DR 40 MG T: 40 | 31 days supply | Qty: 31 | Fill #8

## 2020-06-30 MED FILL — HUMALOG 100 UNITS/ML KWIKPE: 100 | 30 days supply | Qty: 9 | Fill #2

## 2020-07-05 MED FILL — LISINOPRIL 40 MG TABS: 40 | 30 days supply | Qty: 30 | Fill #8

## 2020-07-05 MED FILL — AMLODIPINE BESYLATE 5 MG TA: 5 | 30 days supply | Qty: 30 | Fill #9

## 2020-07-13 MED FILL — FENOFIBRATE 160 MG TABLET: 160 | 30 days supply | Qty: 30 | Fill #4

## 2020-07-13 MED FILL — ROSUVASTATIN CALCIUM 5 MG T: 5 | 30 days supply | Qty: 30 | Fill #9

## 2020-07-13 MED FILL — FLUoxetine HCL 40 MG CAPS: 40 | 30 days supply | Qty: 30 | Fill #5

## 2020-07-13 MED FILL — HYDROCHLOROTHIAZIDE 25 MG T: 25 | 30 days supply | Qty: 15 | Fill #10

## 2020-07-13 MED FILL — CARVEDILOL 25 MG TABS: 25 | 30 days supply | Qty: 60 | Fill #8

## 2020-07-15 MED FILL — LANTUS SOLOSTAR 100 UNITS/M: 100 | 30 days supply | Qty: 18 | Fill #1

## 2020-07-22 MED FILL — KOMBIGLYZE XR 2.5-1,000 MG: 2.5-1000 | 30 days supply | Qty: 60 | Fill #10

## 2020-07-22 MED FILL — GLIMEPIRIDE 4 MG TABS: 4 | 30 days supply | Qty: 60 | Fill #10

## 2020-07-22 MED FILL — PANTOPRAZOLE SOD DR 40 MG T: 40 | 31 days supply | Qty: 31 | Fill #9

## 2020-07-25 ENCOUNTER — Other Ambulatory Visit (HOSPITAL_COMMUNITY): Payer: Self-pay | Admitting: Family Medicine

## 2020-07-25 MED FILL — GABAPENTIN 100 MG CAPSULE: 100 | 30 days supply | Qty: 90 | Fill #0

## 2020-07-25 MED FILL — HYDROCODON-APAP 10-325: 10-325 | 15 days supply | Qty: 60 | Fill #0

## 2020-07-25 MED FILL — HUMALOG 100 UNITS/ML KWIKPE: 100 | 30 days supply | Qty: 9 | Fill #3

## 2020-07-30 MED FILL — AMLODIPINE BESYLATE 5 MG TA: 5 | 30 days supply | Qty: 30 | Fill #10

## 2020-07-30 MED FILL — LISINOPRIL 40 MG TABS: 40 | 30 days supply | Qty: 30 | Fill #9

## 2020-08-07 MED FILL — LANTUS SOLOSTAR 100 UNITS/M: 100 | 30 days supply | Qty: 18 | Fill #2

## 2020-08-15 MED FILL — FLUoxetine HCL 40 MG CAPS: 40 | 30 days supply | Qty: 30 | Fill #6

## 2020-08-15 MED FILL — CARVEDILOL 25 MG TABS: 25 | 30 days supply | Qty: 60 | Fill #9

## 2020-08-15 MED FILL — HYDROCHLOROTHIAZIDE 25 MG T: 25 | 30 days supply | Qty: 15 | Fill #11

## 2020-08-15 MED FILL — FENOFIBRATE 160 MG TABLET: 160 | 30 days supply | Qty: 30 | Fill #5

## 2020-08-15 MED FILL — ROSUVASTATIN CALCIUM 5 MG T: 5 | 30 days supply | Qty: 30 | Fill #10

## 2020-08-20 ENCOUNTER — Other Ambulatory Visit (HOSPITAL_COMMUNITY): Payer: Self-pay | Admitting: Family Medicine

## 2020-08-20 MED FILL — AMOXICILLIN 875 MG TABS: 875 | 10 days supply | Qty: 20 | Fill #0

## 2020-08-22 ENCOUNTER — Other Ambulatory Visit (HOSPITAL_COMMUNITY): Payer: Self-pay | Admitting: Family Medicine

## 2020-08-22 MED FILL — LISINOPRIL 40 MG TABS: 40 | 30 days supply | Qty: 30 | Fill #10

## 2020-08-22 MED FILL — PANTOPRAZOLE SOD DR 40 MG T: 40 | 31 days supply | Qty: 31 | Fill #10

## 2020-08-22 MED FILL — HUMALOG 100 UNITS/ML KWIKPE: 100 | 30 days supply | Qty: 9 | Fill #4

## 2020-08-22 MED FILL — KOMBIGLYZE XR 2.5-1,000 MG: 2.5-1000 | 30 days supply | Qty: 60 | Fill #11

## 2020-08-22 MED FILL — GLIMEPIRIDE 4 MG TABS: 4 | 30 days supply | Qty: 60 | Fill #11

## 2020-08-23 MED FILL — ONE TOUCH VERIO TEST STRIP: 16 days supply | Qty: 50 | Fill #0

## 2020-09-03 MED FILL — LANTUS SOLOSTAR 100 UNITS/M: 100 | 30 days supply | Qty: 18 | Fill #3

## 2020-09-03 MED FILL — AMLODIPINE BESYLATE 5 MG TA: 5 | 30 days supply | Qty: 30 | Fill #11

## 2020-09-12 ENCOUNTER — Other Ambulatory Visit (HOSPITAL_COMMUNITY): Payer: Self-pay | Admitting: Family Medicine

## 2020-09-12 MED FILL — FLUoxetine HCL 40 MG CAPS: 40 | 30 days supply | Qty: 30 | Fill #7

## 2020-09-12 MED FILL — ROSUVASTATIN CALCIUM 5 MG T: 5 | 30 days supply | Qty: 30 | Fill #11

## 2020-09-13 MED FILL — HYDROCHLOROTHIAZIDE 25 MG T: 25 | 30 days supply | Qty: 15 | Fill #0

## 2020-09-13 MED FILL — FENOFIBRATE 160 MG TABLET: 160 | 30 days supply | Qty: 30 | Fill #0

## 2020-09-13 MED FILL — CARVEDILOL 25 MG TABS: 25 | 30 days supply | Qty: 60 | Fill #0

## 2020-09-16 MED FILL — HUMALOG 100 UNITS/ML KWIKPE: 100 | 30 days supply | Qty: 9 | Fill #5

## 2020-09-20 ENCOUNTER — Other Ambulatory Visit (HOSPITAL_COMMUNITY): Payer: Self-pay | Admitting: Family Medicine

## 2020-09-20 MED FILL — GLIMEPIRIDE 4 MG TABS: 4 | 30 days supply | Qty: 60 | Fill #0

## 2020-09-20 MED FILL — LISINOPRIL 40 MG TABS: 40 | 30 days supply | Qty: 30 | Fill #11

## 2020-09-20 MED FILL — KOMBIGLYZE XR 2.5-1,000 MG: 2.5-1000 | 30 days supply | Qty: 60 | Fill #0

## 2020-09-21 MED FILL — PANTOPRAZOLE SOD DR 40 MG T: 40 | 30 days supply | Qty: 30 | Fill #0

## 2020-09-22 ENCOUNTER — Other Ambulatory Visit (HOSPITAL_COMMUNITY): Payer: Self-pay | Admitting: Family Medicine

## 2020-09-26 MED FILL — LANTUS SOLOSTAR 100 UNITS/M: 100 | 30 days supply | Qty: 18 | Fill #4

## 2020-10-03 MED FILL — ONE TOUCH VERIO TEST STRIP: 16 days supply | Qty: 50 | Fill #1

## 2020-10-03 MED FILL — AMLODIPINE BESYLATE 5 MG TA: 5 | 30 days supply | Qty: 30 | Fill #0

## 2020-10-04 ENCOUNTER — Other Ambulatory Visit (HOSPITAL_COMMUNITY): Payer: Self-pay | Admitting: Family Medicine

## 2020-10-11 MED FILL — HUMALOG 100 UNITS/ML KWIKPE: 100 | 20 days supply | Qty: 6 | Fill #6

## 2020-10-14 ENCOUNTER — Other Ambulatory Visit (HOSPITAL_COMMUNITY): Payer: Self-pay | Admitting: Family Medicine

## 2020-10-14 MED FILL — ROSUVASTATIN CALCIUM 5 MG T: 5 | 30 days supply | Qty: 30 | Fill #0

## 2020-10-14 MED FILL — HYDROCHLOROTHIAZIDE 25 MG T: 25 | 30 days supply | Qty: 15 | Fill #1

## 2020-10-14 MED FILL — FLUoxetine HCL 40 MG CAPS: 40 | 30 days supply | Qty: 30 | Fill #8

## 2020-10-14 MED FILL — FENOFIBRATE 160 MG TABLET: 160 | 30 days supply | Qty: 30 | Fill #1

## 2020-10-15 MED FILL — CARVEDILOL 25 MG TABS: 25 | 30 days supply | Qty: 60 | Fill #1

## 2020-10-22 ENCOUNTER — Other Ambulatory Visit (HOSPITAL_COMMUNITY): Payer: Self-pay | Admitting: Family Medicine

## 2020-10-27 ENCOUNTER — Other Ambulatory Visit (HOSPITAL_COMMUNITY): Payer: Self-pay | Admitting: Family Medicine

## 2020-10-28 MED FILL — HUMALOG 100 UNITS/ML KWIKPE: 100 | 30 days supply | Qty: 9 | Fill #0

## 2020-11-09 ENCOUNTER — Other Ambulatory Visit (HOSPITAL_COMMUNITY): Payer: Self-pay

## 2020-11-09 MED FILL — Amlodipine Besylate Tab 5 MG (Base Equivalent): ORAL | 30 days supply | Qty: 30 | Fill #0 | Status: AC

## 2020-11-12 ENCOUNTER — Other Ambulatory Visit (HOSPITAL_COMMUNITY): Payer: Self-pay

## 2020-11-14 ENCOUNTER — Other Ambulatory Visit (HOSPITAL_COMMUNITY): Payer: Self-pay

## 2020-11-14 MED FILL — Carvedilol Tab 25 MG: ORAL | 31 days supply | Qty: 62 | Fill #0 | Status: AC

## 2020-11-14 MED FILL — Rosuvastatin Calcium Tab 5 MG: ORAL | 30 days supply | Qty: 30 | Fill #0 | Status: AC

## 2020-11-14 MED FILL — Fenofibrate Tab 160 MG: ORAL | 30 days supply | Qty: 30 | Fill #0 | Status: AC

## 2020-11-14 MED FILL — Fluoxetine HCl Cap 40 MG: ORAL | 30 days supply | Qty: 30 | Fill #0 | Status: AC

## 2020-11-14 MED FILL — Hydrochlorothiazide Tab 25 MG: ORAL | 30 days supply | Qty: 30 | Fill #0 | Status: AC

## 2020-11-15 ENCOUNTER — Other Ambulatory Visit (HOSPITAL_COMMUNITY): Payer: Self-pay

## 2020-11-15 MED ORDER — ALPRAZOLAM 0.5 MG PO TABS
ORAL_TABLET | ORAL | 0 refills | Status: DC
  Filled 2020-11-15: qty 30, 10d supply, fill #0

## 2020-11-19 ENCOUNTER — Other Ambulatory Visit (HOSPITAL_COMMUNITY): Payer: Self-pay

## 2020-11-19 MED FILL — Insulin Lispro Soln Pen-injector 100 Unit/ML (1 Unit Dial): SUBCUTANEOUS | 30 days supply | Qty: 9 | Fill #0 | Status: AC

## 2020-11-19 MED FILL — Pantoprazole Sodium EC Tab 40 MG (Base Equiv): ORAL | 30 days supply | Qty: 30 | Fill #0 | Status: AC

## 2020-11-19 MED FILL — Insulin Glargine Soln Pen-Injector 100 Unit/ML: SUBCUTANEOUS | 30 days supply | Qty: 6 | Fill #0 | Status: AC

## 2020-11-19 MED FILL — Saxagliptin-Metformin HCl Tab ER 24HR 2.5-1000 MG: ORAL | 30 days supply | Qty: 60 | Fill #0 | Status: AC

## 2020-11-20 ENCOUNTER — Other Ambulatory Visit (HOSPITAL_COMMUNITY): Payer: Self-pay

## 2020-11-22 ENCOUNTER — Other Ambulatory Visit (HOSPITAL_COMMUNITY): Payer: Self-pay

## 2020-11-22 MED FILL — Lisinopril Tab 40 MG: ORAL | 30 days supply | Qty: 30 | Fill #0 | Status: AC

## 2020-11-23 ENCOUNTER — Other Ambulatory Visit (HOSPITAL_COMMUNITY): Payer: Self-pay

## 2020-11-25 ENCOUNTER — Other Ambulatory Visit (HOSPITAL_COMMUNITY): Payer: Self-pay

## 2020-11-25 MED ORDER — LANTUS SOLOSTAR 100 UNIT/ML ~~LOC~~ SOPN
PEN_INJECTOR | SUBCUTANEOUS | 11 refills | Status: DC
  Filled 2020-11-25: qty 18, 30d supply, fill #0

## 2020-11-25 MED FILL — Glucose Blood Test Strip: 25 days supply | Qty: 50 | Fill #0 | Status: AC

## 2020-11-25 MED FILL — Insulin Glargine Soln Pen-Injector 100 Unit/ML: SUBCUTANEOUS | 30 days supply | Qty: 18 | Fill #1 | Status: CN

## 2020-11-26 ENCOUNTER — Other Ambulatory Visit (HOSPITAL_COMMUNITY): Payer: Self-pay

## 2020-11-26 MED ORDER — INSULIN LISPRO (1 UNIT DIAL) 100 UNIT/ML (KWIKPEN)
PEN_INJECTOR | SUBCUTANEOUS | 3 refills | Status: DC
  Filled 2020-11-26: qty 9, 30d supply, fill #0

## 2020-11-26 MED ORDER — LANTUS SOLOSTAR 100 UNIT/ML ~~LOC~~ SOPN
PEN_INJECTOR | SUBCUTANEOUS | 11 refills | Status: DC
  Filled 2020-11-26: qty 18, 25d supply, fill #0
  Filled 2020-12-25: qty 18, 25d supply, fill #1
  Filled 2021-01-16: qty 18, 25d supply, fill #2
  Filled 2021-02-13: qty 18, 25d supply, fill #3
  Filled 2021-03-10: qty 18, 25d supply, fill #4
  Filled 2021-04-10: qty 18, 25d supply, fill #5
  Filled 2021-05-06: qty 18, 25d supply, fill #6
  Filled 2021-06-03: qty 18, 25d supply, fill #7
  Filled 2021-06-25: qty 18, 25d supply, fill #8
  Filled 2021-07-23: qty 18, 25d supply, fill #9
  Filled 2021-08-18: qty 18, 25d supply, fill #10
  Filled 2021-09-08: qty 18, 25d supply, fill #11

## 2020-11-28 ENCOUNTER — Other Ambulatory Visit (HOSPITAL_COMMUNITY): Payer: Self-pay

## 2020-11-28 MED FILL — Insulin Lispro Soln Pen-injector 100 Unit/ML (1 Unit Dial): SUBCUTANEOUS | 30 days supply | Qty: 9 | Fill #1 | Status: CN

## 2020-12-02 ENCOUNTER — Other Ambulatory Visit (HOSPITAL_COMMUNITY): Payer: Self-pay

## 2020-12-04 ENCOUNTER — Other Ambulatory Visit (HOSPITAL_COMMUNITY): Payer: Self-pay

## 2020-12-04 MED FILL — Glimepiride Tab 4 MG: ORAL | 30 days supply | Qty: 60 | Fill #0 | Status: AC

## 2020-12-04 MED FILL — Insulin Lispro Soln Pen-injector 100 Unit/ML (1 Unit Dial): SUBCUTANEOUS | 30 days supply | Qty: 9 | Fill #1 | Status: AC

## 2020-12-05 ENCOUNTER — Other Ambulatory Visit (HOSPITAL_COMMUNITY): Payer: Self-pay

## 2020-12-05 MED ORDER — AMLODIPINE BESYLATE 5 MG PO TABS
ORAL_TABLET | ORAL | 3 refills | Status: DC
  Filled 2020-12-05: qty 30, 30d supply, fill #0
  Filled 2021-01-03: qty 30, 30d supply, fill #1
  Filled 2021-02-03: qty 30, 30d supply, fill #2
  Filled 2021-02-27: qty 30, 30d supply, fill #3

## 2020-12-06 ENCOUNTER — Other Ambulatory Visit (HOSPITAL_COMMUNITY): Payer: Self-pay

## 2020-12-10 MED FILL — Fluoxetine HCl Cap 40 MG: ORAL | 30 days supply | Qty: 30 | Fill #1 | Status: AC

## 2020-12-10 MED FILL — Hydrochlorothiazide Tab 25 MG: ORAL | 30 days supply | Qty: 15 | Fill #1 | Status: AC

## 2020-12-10 MED FILL — Fenofibrate Tab 160 MG: ORAL | 30 days supply | Qty: 30 | Fill #1 | Status: AC

## 2020-12-10 MED FILL — Carvedilol Tab 25 MG: ORAL | 31 days supply | Qty: 62 | Fill #1 | Status: AC

## 2020-12-10 MED FILL — Rosuvastatin Calcium Tab 5 MG: ORAL | 30 days supply | Qty: 30 | Fill #1 | Status: AC

## 2020-12-11 ENCOUNTER — Other Ambulatory Visit (HOSPITAL_COMMUNITY): Payer: Self-pay

## 2020-12-19 MED FILL — Saxagliptin-Metformin HCl Tab ER 24HR 2.5-1000 MG: ORAL | 30 days supply | Qty: 60 | Fill #1 | Status: AC

## 2020-12-19 MED FILL — Pantoprazole Sodium EC Tab 40 MG (Base Equiv): ORAL | 30 days supply | Qty: 30 | Fill #1 | Status: AC

## 2020-12-19 MED FILL — Lisinopril Tab 40 MG: ORAL | 30 days supply | Qty: 30 | Fill #1 | Status: AC

## 2020-12-20 ENCOUNTER — Other Ambulatory Visit (HOSPITAL_COMMUNITY): Payer: Self-pay

## 2020-12-23 ENCOUNTER — Other Ambulatory Visit (HOSPITAL_COMMUNITY): Payer: Self-pay

## 2020-12-25 ENCOUNTER — Other Ambulatory Visit (HOSPITAL_COMMUNITY): Payer: Self-pay

## 2021-01-03 MED FILL — Glucose Blood Test Strip: 25 days supply | Qty: 50 | Fill #1 | Status: AC

## 2021-01-03 MED FILL — Hydrochlorothiazide Tab 25 MG: ORAL | 30 days supply | Qty: 15 | Fill #2 | Status: AC

## 2021-01-04 ENCOUNTER — Other Ambulatory Visit (HOSPITAL_COMMUNITY): Payer: Self-pay

## 2021-01-04 MED FILL — Glimepiride Tab 4 MG: ORAL | 30 days supply | Qty: 60 | Fill #1 | Status: AC

## 2021-01-07 ENCOUNTER — Other Ambulatory Visit (HOSPITAL_COMMUNITY): Payer: Self-pay

## 2021-01-08 ENCOUNTER — Other Ambulatory Visit (HOSPITAL_COMMUNITY): Payer: Self-pay

## 2021-01-09 ENCOUNTER — Other Ambulatory Visit (HOSPITAL_COMMUNITY): Payer: Self-pay

## 2021-01-09 MED FILL — Fluoxetine HCl Cap 40 MG: ORAL | 30 days supply | Qty: 30 | Fill #2 | Status: AC

## 2021-01-09 MED FILL — Fenofibrate Tab 160 MG: ORAL | 30 days supply | Qty: 30 | Fill #2 | Status: AC

## 2021-01-09 MED FILL — Rosuvastatin Calcium Tab 5 MG: ORAL | 30 days supply | Qty: 30 | Fill #2 | Status: AC

## 2021-01-09 MED FILL — Carvedilol Tab 25 MG: ORAL | 31 days supply | Qty: 62 | Fill #2 | Status: AC

## 2021-01-10 ENCOUNTER — Other Ambulatory Visit (HOSPITAL_COMMUNITY): Payer: Self-pay

## 2021-01-14 MED FILL — Insulin Lispro Soln Pen-injector 100 Unit/ML (1 Unit Dial): SUBCUTANEOUS | 30 days supply | Qty: 9 | Fill #2 | Status: AC

## 2021-01-15 ENCOUNTER — Other Ambulatory Visit (HOSPITAL_COMMUNITY): Payer: Self-pay

## 2021-01-15 MED FILL — Pantoprazole Sodium EC Tab 40 MG (Base Equiv): ORAL | 30 days supply | Qty: 30 | Fill #2 | Status: AC

## 2021-01-15 MED FILL — Lisinopril Tab 40 MG: ORAL | 30 days supply | Qty: 30 | Fill #2 | Status: AC

## 2021-01-16 ENCOUNTER — Other Ambulatory Visit (HOSPITAL_COMMUNITY): Payer: Self-pay

## 2021-01-16 MED ORDER — KOMBIGLYZE XR 2.5-1000 MG PO TB24
ORAL_TABLET | ORAL | 3 refills | Status: DC
  Filled 2021-01-16: qty 60, 30d supply, fill #0
  Filled 2021-02-17: qty 60, 30d supply, fill #1
  Filled 2021-03-18: qty 60, 30d supply, fill #2

## 2021-02-03 ENCOUNTER — Other Ambulatory Visit (HOSPITAL_COMMUNITY): Payer: Self-pay

## 2021-02-04 ENCOUNTER — Other Ambulatory Visit (HOSPITAL_COMMUNITY): Payer: Self-pay

## 2021-02-04 MED ORDER — GLIMEPIRIDE 4 MG PO TABS
ORAL_TABLET | ORAL | 3 refills | Status: DC
  Filled 2021-02-04: qty 60, 30d supply, fill #0
  Filled 2021-02-27: qty 60, 30d supply, fill #1
  Filled 2021-03-28: qty 60, 30d supply, fill #2

## 2021-02-11 ENCOUNTER — Other Ambulatory Visit (HOSPITAL_COMMUNITY): Payer: Self-pay

## 2021-02-11 MED ORDER — ROSUVASTATIN CALCIUM 5 MG PO TABS
ORAL_TABLET | ORAL | 3 refills | Status: DC
  Filled 2021-02-11: qty 30, 30d supply, fill #0
  Filled 2021-03-18: qty 30, 30d supply, fill #1
  Filled 2021-04-16: qty 30, 30d supply, fill #2
  Filled 2021-05-13: qty 30, 30d supply, fill #3

## 2021-02-11 MED ORDER — FLUOXETINE HCL 40 MG PO CAPS
ORAL_CAPSULE | ORAL | 3 refills | Status: DC
  Filled 2021-02-11: qty 90, 90d supply, fill #0
  Filled 2021-05-06: qty 90, 90d supply, fill #1
  Filled 2021-08-13: qty 90, 90d supply, fill #2
  Filled 2021-11-06: qty 90, 90d supply, fill #3

## 2021-02-11 MED FILL — Insulin Lispro Soln Pen-injector 100 Unit/ML (1 Unit Dial): SUBCUTANEOUS | 30 days supply | Qty: 9 | Fill #3 | Status: AC

## 2021-02-11 MED FILL — Fenofibrate Tab 160 MG: ORAL | 30 days supply | Qty: 30 | Fill #3 | Status: AC

## 2021-02-11 MED FILL — Carvedilol Tab 25 MG: ORAL | 31 days supply | Qty: 62 | Fill #3 | Status: AC

## 2021-02-13 ENCOUNTER — Other Ambulatory Visit (HOSPITAL_COMMUNITY): Payer: Self-pay

## 2021-02-17 MED FILL — Lisinopril Tab 40 MG: ORAL | 30 days supply | Qty: 30 | Fill #3 | Status: AC

## 2021-02-17 MED FILL — Insulin Pen Needle 31 G X 8 MM (1/3" or 5/16"): 90 days supply | Qty: 100 | Fill #0 | Status: AC

## 2021-02-18 ENCOUNTER — Other Ambulatory Visit (HOSPITAL_COMMUNITY): Payer: Self-pay

## 2021-02-20 ENCOUNTER — Other Ambulatory Visit (HOSPITAL_COMMUNITY): Payer: Self-pay

## 2021-02-20 MED ORDER — ALPRAZOLAM 0.5 MG PO TABS
ORAL_TABLET | ORAL | 0 refills | Status: DC
  Filled 2021-02-20: qty 30, 10d supply, fill #0

## 2021-02-20 MED FILL — Pantoprazole Sodium EC Tab 40 MG (Base Equiv): ORAL | 30 days supply | Qty: 30 | Fill #3 | Status: AC

## 2021-02-26 ENCOUNTER — Other Ambulatory Visit (HOSPITAL_COMMUNITY): Payer: Self-pay

## 2021-02-26 MED ORDER — JANUMET XR 50-1000 MG PO TB24
1.0000 | ORAL_TABLET | Freq: Every day | ORAL | 5 refills | Status: DC
  Filled 2021-02-26: qty 30, 30d supply, fill #0
  Filled 2021-04-23: qty 30, 30d supply, fill #1
  Filled 2021-05-20: qty 30, 30d supply, fill #2
  Filled 2021-06-23: qty 30, 30d supply, fill #3
  Filled 2021-07-23: qty 30, 30d supply, fill #4
  Filled 2021-08-18: qty 30, 30d supply, fill #5

## 2021-02-28 ENCOUNTER — Other Ambulatory Visit (HOSPITAL_COMMUNITY): Payer: Self-pay

## 2021-03-11 ENCOUNTER — Other Ambulatory Visit (HOSPITAL_COMMUNITY): Payer: Self-pay

## 2021-03-12 ENCOUNTER — Other Ambulatory Visit (HOSPITAL_COMMUNITY): Payer: Self-pay

## 2021-03-12 MED FILL — Insulin Lispro Soln Pen-injector 100 Unit/ML (1 Unit Dial): SUBCUTANEOUS | 30 days supply | Qty: 9 | Fill #4 | Status: AC

## 2021-03-18 ENCOUNTER — Other Ambulatory Visit (HOSPITAL_COMMUNITY): Payer: Self-pay

## 2021-03-18 MED ORDER — FENOFIBRATE 160 MG PO TABS
ORAL_TABLET | ORAL | 5 refills | Status: DC
  Filled 2021-03-18: qty 30, 30d supply, fill #0
  Filled 2021-04-16: qty 30, 30d supply, fill #1
  Filled 2021-05-06: qty 30, 30d supply, fill #2
  Filled 2021-06-05: qty 30, 30d supply, fill #3
  Filled 2021-07-11: qty 30, 30d supply, fill #4
  Filled 2021-08-13: qty 30, 30d supply, fill #5

## 2021-03-18 MED FILL — Lisinopril Tab 40 MG: ORAL | 30 days supply | Qty: 30 | Fill #4 | Status: AC

## 2021-03-18 MED FILL — Carvedilol Tab 25 MG: ORAL | 31 days supply | Qty: 62 | Fill #4 | Status: AC

## 2021-03-18 MED FILL — Hydrochlorothiazide Tab 25 MG: ORAL | 30 days supply | Qty: 15 | Fill #3 | Status: AC

## 2021-03-18 MED FILL — Pantoprazole Sodium EC Tab 40 MG (Base Equiv): ORAL | 30 days supply | Qty: 30 | Fill #4 | Status: AC

## 2021-03-19 ENCOUNTER — Other Ambulatory Visit (HOSPITAL_COMMUNITY): Payer: Self-pay

## 2021-03-20 ENCOUNTER — Other Ambulatory Visit (HOSPITAL_COMMUNITY): Payer: Self-pay

## 2021-03-26 ENCOUNTER — Other Ambulatory Visit (HOSPITAL_COMMUNITY): Payer: Self-pay

## 2021-03-28 ENCOUNTER — Other Ambulatory Visit (HOSPITAL_COMMUNITY): Payer: Self-pay

## 2021-04-01 ENCOUNTER — Other Ambulatory Visit (HOSPITAL_COMMUNITY): Payer: Self-pay

## 2021-04-01 MED ORDER — GLIMEPIRIDE 4 MG PO TABS
ORAL_TABLET | ORAL | 3 refills | Status: DC
  Filled 2021-04-01: qty 60, 30d supply, fill #0
  Filled 2021-05-01: qty 60, 30d supply, fill #1
  Filled 2021-05-27: qty 60, 30d supply, fill #2
  Filled 2021-07-04: qty 60, 30d supply, fill #3

## 2021-04-03 ENCOUNTER — Other Ambulatory Visit (HOSPITAL_COMMUNITY): Payer: Self-pay

## 2021-04-03 MED ORDER — AMLODIPINE BESYLATE 5 MG PO TABS
ORAL_TABLET | ORAL | 3 refills | Status: DC
  Filled 2021-04-03: qty 30, 30d supply, fill #0
  Filled 2021-05-01: qty 30, 30d supply, fill #1
  Filled 2021-05-27: qty 30, 30d supply, fill #2
  Filled 2021-07-04: qty 30, 30d supply, fill #3

## 2021-04-10 ENCOUNTER — Other Ambulatory Visit (HOSPITAL_COMMUNITY): Payer: Self-pay

## 2021-04-10 MED FILL — Glucose Blood Test Strip: 33 days supply | Qty: 100 | Fill #2 | Status: AC

## 2021-04-11 ENCOUNTER — Other Ambulatory Visit (HOSPITAL_COMMUNITY): Payer: Self-pay

## 2021-04-11 MED ORDER — ONETOUCH VERIO VI STRP
ORAL_STRIP | 2 refills | Status: DC
  Filled 2021-04-11 – 2021-05-26 (×2): qty 100, 33d supply, fill #0
  Filled 2021-09-04: qty 100, 33d supply, fill #1
  Filled 2021-12-17: qty 100, 33d supply, fill #2

## 2021-04-16 ENCOUNTER — Other Ambulatory Visit (HOSPITAL_COMMUNITY): Payer: Self-pay

## 2021-04-16 MED FILL — Carvedilol Tab 25 MG: ORAL | 31 days supply | Qty: 62 | Fill #5 | Status: AC

## 2021-04-16 MED FILL — Hydrochlorothiazide Tab 25 MG: ORAL | 30 days supply | Qty: 15 | Fill #4 | Status: AC

## 2021-04-16 MED FILL — Lisinopril Tab 40 MG: ORAL | 30 days supply | Qty: 30 | Fill #5 | Status: AC

## 2021-04-16 MED FILL — Insulin Lispro Soln Pen-injector 100 Unit/ML (1 Unit Dial): SUBCUTANEOUS | 30 days supply | Qty: 9 | Fill #5 | Status: CN

## 2021-04-17 ENCOUNTER — Other Ambulatory Visit (HOSPITAL_COMMUNITY): Payer: Self-pay

## 2021-04-17 MED ORDER — INSULIN LISPRO (1 UNIT DIAL) 100 UNIT/ML (KWIKPEN)
PEN_INJECTOR | SUBCUTANEOUS | 3 refills | Status: DC
  Filled 2021-04-17: qty 15, 50d supply, fill #0
  Filled 2021-06-03: qty 15, 50d supply, fill #1
  Filled 2021-07-13: qty 15, 50d supply, fill #2
  Filled 2021-09-04: qty 15, 50d supply, fill #3

## 2021-04-18 ENCOUNTER — Other Ambulatory Visit (HOSPITAL_COMMUNITY): Payer: Self-pay

## 2021-04-23 MED FILL — Pantoprazole Sodium EC Tab 40 MG (Base Equiv): ORAL | 30 days supply | Qty: 30 | Fill #5 | Status: AC

## 2021-04-24 ENCOUNTER — Other Ambulatory Visit (HOSPITAL_COMMUNITY): Payer: Self-pay

## 2021-05-02 ENCOUNTER — Other Ambulatory Visit (HOSPITAL_COMMUNITY): Payer: Self-pay

## 2021-05-07 ENCOUNTER — Other Ambulatory Visit (HOSPITAL_COMMUNITY): Payer: Self-pay

## 2021-05-09 ENCOUNTER — Other Ambulatory Visit (HOSPITAL_COMMUNITY): Payer: Self-pay

## 2021-05-13 ENCOUNTER — Other Ambulatory Visit (HOSPITAL_COMMUNITY): Payer: Self-pay

## 2021-05-13 MED FILL — Hydrochlorothiazide Tab 25 MG: ORAL | 30 days supply | Qty: 15 | Fill #5 | Status: AC

## 2021-05-20 MED FILL — Lisinopril Tab 40 MG: ORAL | 30 days supply | Qty: 30 | Fill #6 | Status: AC

## 2021-05-20 MED FILL — Carvedilol Tab 25 MG: ORAL | 31 days supply | Qty: 62 | Fill #6 | Status: AC

## 2021-05-21 ENCOUNTER — Other Ambulatory Visit (HOSPITAL_COMMUNITY): Payer: Self-pay

## 2021-05-26 MED FILL — Pantoprazole Sodium EC Tab 40 MG (Base Equiv): ORAL | 30 days supply | Qty: 30 | Fill #6 | Status: AC

## 2021-05-27 ENCOUNTER — Other Ambulatory Visit (HOSPITAL_COMMUNITY): Payer: Self-pay

## 2021-05-28 ENCOUNTER — Other Ambulatory Visit (HOSPITAL_COMMUNITY): Payer: Self-pay

## 2021-06-03 ENCOUNTER — Other Ambulatory Visit (HOSPITAL_COMMUNITY): Payer: Self-pay

## 2021-06-05 ENCOUNTER — Other Ambulatory Visit (HOSPITAL_COMMUNITY): Payer: Self-pay

## 2021-06-05 MED FILL — Hydrochlorothiazide Tab 25 MG: ORAL | 30 days supply | Qty: 15 | Fill #6 | Status: AC

## 2021-06-06 ENCOUNTER — Other Ambulatory Visit (HOSPITAL_COMMUNITY): Payer: Self-pay

## 2021-06-06 MED ORDER — ALPRAZOLAM 0.5 MG PO TABS
ORAL_TABLET | ORAL | 0 refills | Status: DC
  Filled 2021-06-06: qty 30, 10d supply, fill #0

## 2021-06-18 ENCOUNTER — Other Ambulatory Visit (HOSPITAL_COMMUNITY): Payer: Self-pay

## 2021-06-18 MED ORDER — ROSUVASTATIN CALCIUM 5 MG PO TABS
ORAL_TABLET | ORAL | 3 refills | Status: DC
  Filled 2021-06-18: qty 30, 30d supply, fill #0
  Filled 2021-07-11: qty 30, 30d supply, fill #1
  Filled 2021-08-13: qty 30, 30d supply, fill #2
  Filled 2021-09-16: qty 30, 30d supply, fill #3

## 2021-06-19 ENCOUNTER — Other Ambulatory Visit (HOSPITAL_COMMUNITY): Payer: Self-pay

## 2021-06-24 ENCOUNTER — Other Ambulatory Visit (HOSPITAL_COMMUNITY): Payer: Self-pay

## 2021-06-25 MED FILL — Lisinopril Tab 40 MG: ORAL | 30 days supply | Qty: 30 | Fill #7 | Status: AC

## 2021-06-25 MED FILL — Carvedilol Tab 25 MG: ORAL | 31 days supply | Qty: 62 | Fill #7 | Status: AC

## 2021-06-26 ENCOUNTER — Other Ambulatory Visit (HOSPITAL_COMMUNITY): Payer: Self-pay

## 2021-07-04 MED FILL — Pantoprazole Sodium EC Tab 40 MG (Base Equiv): ORAL | 30 days supply | Qty: 30 | Fill #7 | Status: AC

## 2021-07-05 ENCOUNTER — Other Ambulatory Visit (HOSPITAL_COMMUNITY): Payer: Self-pay

## 2021-07-11 MED FILL — Hydrochlorothiazide Tab 25 MG: ORAL | 30 days supply | Qty: 15 | Fill #7 | Status: AC

## 2021-07-12 ENCOUNTER — Other Ambulatory Visit (HOSPITAL_COMMUNITY): Payer: Self-pay

## 2021-07-14 ENCOUNTER — Other Ambulatory Visit (HOSPITAL_COMMUNITY): Payer: Self-pay

## 2021-07-23 ENCOUNTER — Other Ambulatory Visit (HOSPITAL_COMMUNITY): Payer: Self-pay

## 2021-07-23 MED FILL — Lisinopril Tab 40 MG: ORAL | 30 days supply | Qty: 30 | Fill #8 | Status: AC

## 2021-07-29 ENCOUNTER — Other Ambulatory Visit (HOSPITAL_COMMUNITY): Payer: Self-pay

## 2021-07-29 MED ORDER — GLIMEPIRIDE 4 MG PO TABS
ORAL_TABLET | ORAL | 3 refills | Status: DC
  Filled 2021-07-29: qty 60, 30d supply, fill #0
  Filled 2021-09-22: qty 60, 30d supply, fill #1
  Filled 2021-10-22: qty 60, 30d supply, fill #2
  Filled 2021-11-19: qty 60, 30d supply, fill #3

## 2021-07-29 MED ORDER — PANTOPRAZOLE SODIUM 40 MG PO TBEC
DELAYED_RELEASE_TABLET | ORAL | 3 refills | Status: DC
  Filled 2021-07-29: qty 90, 90d supply, fill #0
  Filled 2021-10-29: qty 90, 90d supply, fill #1
  Filled 2022-01-28: qty 90, 90d supply, fill #2
  Filled 2022-04-25: qty 90, 90d supply, fill #3

## 2021-07-29 MED ORDER — CARVEDILOL 25 MG PO TABS
ORAL_TABLET | ORAL | 3 refills | Status: DC
  Filled 2021-07-29: qty 180, 90d supply, fill #0
  Filled 2021-10-29: qty 180, 90d supply, fill #1
  Filled 2022-01-28: qty 180, 90d supply, fill #2
  Filled 2022-04-25: qty 180, 90d supply, fill #3

## 2021-07-29 MED ORDER — AMLODIPINE BESYLATE 5 MG PO TABS
ORAL_TABLET | ORAL | 3 refills | Status: DC
  Filled 2021-07-29: qty 30, 30d supply, fill #0
  Filled 2021-08-26: qty 30, 30d supply, fill #1
  Filled 2021-10-03: qty 30, 30d supply, fill #2
  Filled 2021-11-06: qty 30, 30d supply, fill #3

## 2021-08-13 ENCOUNTER — Other Ambulatory Visit (HOSPITAL_COMMUNITY): Payer: Self-pay

## 2021-08-13 MED FILL — Hydrochlorothiazide Tab 25 MG: ORAL | 30 days supply | Qty: 15 | Fill #8 | Status: AC

## 2021-08-19 ENCOUNTER — Other Ambulatory Visit (HOSPITAL_COMMUNITY): Payer: Self-pay

## 2021-08-26 MED FILL — Lisinopril Tab 40 MG: ORAL | 30 days supply | Qty: 30 | Fill #9 | Status: AC

## 2021-08-27 ENCOUNTER — Other Ambulatory Visit (HOSPITAL_COMMUNITY): Payer: Self-pay

## 2021-09-04 ENCOUNTER — Other Ambulatory Visit (HOSPITAL_COMMUNITY): Payer: Self-pay

## 2021-09-04 MED ORDER — FENOFIBRATE 160 MG PO TABS
160.0000 mg | ORAL_TABLET | Freq: Every day | ORAL | 5 refills | Status: DC
  Filled 2021-09-04: qty 30, 30d supply, fill #0
  Filled 2021-10-03: qty 30, 30d supply, fill #1
  Filled 2021-11-06: qty 30, 30d supply, fill #2
  Filled 2021-12-10: qty 30, 30d supply, fill #3
  Filled 2022-01-09: qty 30, 30d supply, fill #4
  Filled 2022-02-11: qty 30, 30d supply, fill #5

## 2021-09-04 MED FILL — Hydrochlorothiazide Tab 25 MG: ORAL | 30 days supply | Qty: 15 | Fill #9 | Status: AC

## 2021-09-05 ENCOUNTER — Other Ambulatory Visit (HOSPITAL_COMMUNITY): Payer: Self-pay

## 2021-09-09 ENCOUNTER — Other Ambulatory Visit (HOSPITAL_COMMUNITY): Payer: Self-pay

## 2021-09-16 ENCOUNTER — Other Ambulatory Visit (HOSPITAL_COMMUNITY): Payer: Self-pay

## 2021-09-22 ENCOUNTER — Other Ambulatory Visit (HOSPITAL_COMMUNITY): Payer: Self-pay

## 2021-09-22 MED ORDER — JANUMET XR 50-1000 MG PO TB24
1.0000 | ORAL_TABLET | Freq: Every day | ORAL | 3 refills | Status: DC
  Filled 2021-09-22: qty 30, 30d supply, fill #0
  Filled 2021-10-22: qty 30, 30d supply, fill #1
  Filled 2021-11-19: qty 30, 30d supply, fill #2
  Filled 2021-12-17: qty 30, 30d supply, fill #3

## 2021-09-22 MED FILL — Lisinopril Tab 40 MG: ORAL | 30 days supply | Qty: 30 | Fill #10 | Status: AC

## 2021-09-23 ENCOUNTER — Other Ambulatory Visit (HOSPITAL_COMMUNITY): Payer: Self-pay

## 2021-10-03 ENCOUNTER — Other Ambulatory Visit (HOSPITAL_COMMUNITY): Payer: Self-pay

## 2021-10-04 ENCOUNTER — Other Ambulatory Visit (HOSPITAL_COMMUNITY): Payer: Self-pay

## 2021-10-06 ENCOUNTER — Other Ambulatory Visit (HOSPITAL_COMMUNITY): Payer: Self-pay

## 2021-10-07 ENCOUNTER — Other Ambulatory Visit (HOSPITAL_COMMUNITY): Payer: Self-pay

## 2021-10-07 MED ORDER — HYDROCHLOROTHIAZIDE 25 MG PO TABS
ORAL_TABLET | ORAL | 3 refills | Status: DC
  Filled 2021-10-07: qty 45, 90d supply, fill #0
  Filled 2022-01-01: qty 45, 90d supply, fill #1
  Filled 2022-04-18: qty 45, 90d supply, fill #2
  Filled 2022-07-28: qty 45, 90d supply, fill #3

## 2021-10-08 ENCOUNTER — Other Ambulatory Visit (HOSPITAL_COMMUNITY): Payer: Self-pay

## 2021-10-09 ENCOUNTER — Other Ambulatory Visit (HOSPITAL_COMMUNITY): Payer: Self-pay

## 2021-10-09 MED ORDER — LANTUS SOLOSTAR 100 UNIT/ML ~~LOC~~ SOPN
PEN_INJECTOR | SUBCUTANEOUS | 11 refills | Status: DC
  Filled 2021-10-09: qty 18, 25d supply, fill #0
  Filled 2021-11-06: qty 18, 25d supply, fill #1
  Filled 2021-11-29: qty 18, 25d supply, fill #2
  Filled 2021-12-28: qty 18, 25d supply, fill #3
  Filled 2022-01-26: qty 18, 25d supply, fill #4
  Filled 2022-02-22: qty 18, 25d supply, fill #5
  Filled 2022-03-21: qty 18, 25d supply, fill #6
  Filled 2022-04-25: qty 18, 25d supply, fill #7
  Filled 2022-06-02: qty 18, 25d supply, fill #8
  Filled 2022-06-30: qty 18, 25d supply, fill #9
  Filled 2022-07-28: qty 18, 25d supply, fill #10
  Filled 2022-08-27: qty 18, 25d supply, fill #11

## 2021-10-22 ENCOUNTER — Other Ambulatory Visit (HOSPITAL_COMMUNITY): Payer: Self-pay

## 2021-10-22 MED ORDER — ROSUVASTATIN CALCIUM 5 MG PO TABS
5.0000 mg | ORAL_TABLET | Freq: Every day | ORAL | 3 refills | Status: DC
  Filled 2021-10-22: qty 30, 30d supply, fill #0
  Filled 2021-11-19: qty 30, 30d supply, fill #1
  Filled 2021-12-17: qty 30, 30d supply, fill #2
  Filled 2022-01-15: qty 30, 30d supply, fill #3

## 2021-10-22 MED ORDER — INSULIN LISPRO (1 UNIT DIAL) 100 UNIT/ML (KWIKPEN)
PEN_INJECTOR | SUBCUTANEOUS | 3 refills | Status: DC
  Filled 2021-10-22: qty 15, 50d supply, fill #0
  Filled 2021-12-02: qty 15, 50d supply, fill #1
  Filled 2022-01-19: qty 15, 50d supply, fill #2
  Filled 2022-03-15: qty 15, 50d supply, fill #3

## 2021-10-22 MED FILL — Lisinopril Tab 40 MG: ORAL | 30 days supply | Qty: 30 | Fill #11 | Status: AC

## 2021-10-30 ENCOUNTER — Other Ambulatory Visit (HOSPITAL_COMMUNITY): Payer: Self-pay

## 2021-10-30 ENCOUNTER — Telehealth: Payer: Self-pay

## 2021-10-30 NOTE — Telephone Encounter (Signed)
Notes scanned to refferral ?

## 2021-11-06 ENCOUNTER — Other Ambulatory Visit (HOSPITAL_COMMUNITY): Payer: Self-pay

## 2021-11-13 ENCOUNTER — Encounter: Payer: Self-pay | Admitting: Cardiovascular Disease

## 2021-11-13 ENCOUNTER — Ambulatory Visit (INDEPENDENT_AMBULATORY_CARE_PROVIDER_SITE_OTHER): Payer: Medicare Other | Admitting: Cardiovascular Disease

## 2021-11-13 VITALS — BP 136/68 | HR 54 | Ht 68.0 in | Wt 259.6 lb

## 2021-11-13 DIAGNOSIS — R072 Precordial pain: Secondary | ICD-10-CM | POA: Diagnosis not present

## 2021-11-13 DIAGNOSIS — R079 Chest pain, unspecified: Secondary | ICD-10-CM

## 2021-11-13 NOTE — Patient Instructions (Signed)
Medication Instructions:  ?No changes ?*If you need a refill on your cardiac medications before your next appointment, please call your pharmacy* ? ? ?Lab Work: ?none ?If you have labs (blood work) drawn today and your tests are completely normal, you will receive your results only by: ?MyChart Message (if you have MyChart) OR ?A paper copy in the mail ?If you have any lab test that is abnormal or we need to change your treatment, we will call you to review the results. ? ? ?Testing/Procedures: ?Your physician has requested that you have an echocardiogram. Echocardiography is a painless test that uses sound waves to create images of your heart. It provides your doctor with information about the size and shape of your heart and how well your heart?s chambers and valves are working. This procedure takes approximately one hour. There are no restrictions for this procedure. ? ?Cardiac CTA - see instructions below ? ? ?Follow-Up: ?At [facility], you and your health needs are our priority.  As part of our continuing mission to provide you with exceptional heart care, we have created designated Provider Care Teams.  These Care Teams include your primary Cardiologist (physician) and Advanced Practice Providers (APPs -  Physician Assistants and Nurse Practitioners) who all work together to provide you with the care you need, when you need it. ? ?We recommend signing up for the patient portal called "MyChart".  Sign up information is provided on this After Visit Summary.  MyChart is used to connect with patients for Virtual Visits (Telemedicine).  Patients are able to view lab/test results, encounter notes, upcoming appointments, etc.  Non-urgent messages can be sent to your provider as well.   ?To learn more about what you can do with MyChart, go to [email or web address].   ? ?Your next appointment:   ?2-3 month(s) ? ?The format for your next appointment:   ?In Person ? ?Provider:   ?Verne Carrow, MD  ?Or  office Advanced Practice Provider (NP or PA)  ? ? ?Other Instructions ? ? ?Your cardiac CT will be scheduled at  ?[facility] ?[address] ?[place], [place] [address] ?[phone] ? ? ?Please arrive at the [facility] (Entrance C2) of [facility] 30 minutes prior to test start time. ?You can use the FREE valet parking offered at entrance C (encouraged to control the heart rate for the test)  ?Proceed to the [facility] Radiology Department (first floor) to check-in and test prep. ? ?All radiology patients and guests should use entrance C2 at [facility], accessed from [facility], even though the hospital's physical address listed is [address] ? ? ? ? ?Please follow these instructions carefully (unless otherwise directed): ? ?Hold all erectile dysfunction medications at least 3 days (72 hrs) prior to test. ? ?On the Night Before the Test: ?Be sure to Drink plenty of water. ?Do not consume any caffeinated/decaffeinated beverages or chocolate 12 hours prior to your test. ?Do not take any antihistamines 12 hours prior to your test. ? ?On the Day of the Test: ?Drink plenty of water until 1 hour prior to the test. ?Do not eat any food 4 hours prior to the test. ?You may take your regular medications prior to the test.  ?Take your morning dose of carvedilol (Coreg) two hours prior to test. ?HOLD Hydrochlorothiazide morning of the test. ? ?     ?After the Test: ?Drink plenty of water. ?After receiving IV contrast, you may experience a  mild flushed feeling. This is normal. ?On occasion, you may experience a mild rash up to 24 hours after the test. This is not dangerous. If this occurs, you can take Benadryl 25 mg and increase your fluid intake. ?If you experience trouble breathing, this can be serious. If it is severe call 911 IMMEDIATELY. If it is mild, please call our office. ?If you take any of these medications: Glipizide/Metformin,  Avandament, Glucavance, please do not take 48 hours after completing test unless otherwise instructed. Thera Flake) ? ?We will call to schedule your test 2-4 weeks out understanding that some insurance companies will need an authorization prior to the service being performed.  ? ?For non-scheduling related questions, please contact the cardiac imaging nurse navigator should you have any questions/concerns: ?Rockwell Alexandria, Cardiac Imaging Nurse Navigator ?Larey Brick, Cardiac Imaging Nurse Navigator ?[facility] ?Direct Office Dial: [phone]  ? ?For scheduling needs, including cancellations and rescheduling, please call [place], [phone]. ?  ?

## 2021-11-13 NOTE — Progress Notes (Signed)
? ?Chief Complaint  ?Patient presents with  ? New Patient (Initial Visit)  ?  Chest pain ?  ? ?History of Present Illness:65 yo male with history of anxiety, depression, CAD, diabetes, GERD, HTN, HLD and sleep apnea here today as a new patient for the evaluation of chest pain. He had a cardiac cath in 2009 that showed mild CAD and normal LV systolic function. He has been having episodes of right sided chest pressure that begins at rest and may radiate to his left chest or left back. No exertional chest pressure. The episodes last for an hour. No associated dyspnea, dizziness or diaphoresis.  ? ?Primary Care Physician: [name], MD ? ?Past Medical History:  ?Diagnosis Date  ? Anxiety   ? Arthritis   ? Chest pressure   ? cardiac cath; minimal non-obs CAD and normal EF 06/2008  ? Depression   ? Diabetes mellitus   ? x5 years  type 2  ? Family history of anesthesia complication   ? mother n/v  ? GERD (gastroesophageal reflux disease)   ? Glaucoma   ? Headache(784.0)   ?  headaches   Cluster  ? Heart murmur   ? slight per pcp that is retired [employer]  no issues   ? History of peptic ulcer disease   ? Hyperlipidemia   ? Hypertension   ? s/p syncopal episode with facial trauma due to orthostasis 10/2008. diuretic held  ? Obesity   ? PONV (postoperative nausea and vomiting)   ? once  ? Sleep apnea   ? dont use CPAP  ? Syncopal episodes   ? likely due to orthostatic hypotension  ? ? ?Past Surgical History:  ?Procedure Laterality Date  ? left knee surgery    ? medial meniscus repair x2  ? MOUTH SURGERY    ? to make palate wider  ? NASAL HEMORRHAGE CONTROL N/A 03/24/2013  ? Procedure: EPISTAXIS CONTROL;  Surgeon: [name], MD;  [facility] OR;  Service: ENT;  Laterality: N/A;  ? NASAL SEPTOPLASTY W/ TURBINOPLASTY N/A 03/24/2013  ? Procedure: NASAL SEPTOPLASTY WITH TURBINATE REDUCTION;  Surgeon: [name], MD;  Location: [facility] OR;  Service: ENT;  Laterality: N/A;  ? NECK SURGERY    ? C5-C6  ? VASECTOMY     ? ? ?Current Outpatient Medications  ?Medication Sig Dispense Refill  ? ALPRAZolam (XANAX) 0.5 MG tablet Take one tablet by mouth 3 times a day as needed for sleep or anxiety. 30 tablet 0  ? amLODipine (NORVASC) 5 MG tablet Take one tablet (5 mg dose) by mouth daily. 30 tablet 3  ? aspirin EC 81 MG tablet Take 81 mg by mouth daily.    ? carvedilol (COREG) 25 MG tablet Take 1 tablet (25 mg dose) by mouth 2 (two) times daily with a meal. 180 tablet 3  ? fenofibrate 160 MG tablet Take 1 tablet by mouth daily. 30 tablet 5  ? FLUoxetine (PROZAC) 40 MG capsule Take 1 capsule by mouth daily. 90 capsule 3  ? gabapentin (NEURONTIN) 100 MG capsule Take 1 capsule (100 mg total) by mouth 3 (three) times daily. 90 capsule 1  ? glimepiride (AMARYL) 4 MG tablet Take 1 tablet (4 mg dose) by mouth 2 (two) times daily. 60 tablet 3  ? HUMALOG KWIKPEN 100 UNIT/ML KwikPen Inject 20 Units into the skin daily with supper.     ? hydrochlorothiazide (HYDRODIURIL) 25 MG tablet Take one half tablet (12.5 mg dose) by mouth daily. 90 tablet  3  ? HYDROcodone-acetaminophen (NORCO/VICODIN) 5-325 MG tablet Take 1-2 tablets by mouth every 6 (six) hours as needed for moderate pain. (Patient taking differently: Take 1-2 tablets by mouth every 6 (six) hours as needed (knee pain.).) 40 tablet 0  ? ibuprofen (ADVIL) 200 MG tablet Take 800 mg by mouth every 8 (eight) hours as needed (for pain.).    ? LANTUS SOLOSTAR 100 UNIT/ML Solostar Pen INJECT 70 UNITS INTO THE SKIN EVERY MORNING 18 mL 11  ? ONETOUCH VERIO test strip Use to test blood sugar 3 times a day 100 each 2  ? pantoprazole (PROTONIX) 40 MG tablet Take 1 tablet (40 mg dose) by mouth daily. 90 tablet 3  ? rosuvastatin (CRESTOR) 5 MG tablet Take 1 tablet by mouth daily. 30 tablet 3  ? SitaGLIPtin-MetFORMIN HCl (JANUMET XR) 50-1000 MG TB24 Take 1 tablet by mouth daily. 30 tablet 3  ? lisinopril (ZESTRIL) 40 MG tablet TAKE 1 TABLET BY MOUTH ONCE DAILY 90 tablet 11  ? ?No current  facility-administered medications for this visit.  ? ? ?Allergies  ?Allergen Reactions  ? Codeine   ?  REACTION: GI uspet  ? Prednisone   ?  Makes him "ill"  ? ? ?Social History  ? ?Socioeconomic History  ? Marital status: Married  ?  Spouse name: Not on file  ? Number of children: 1  ? Years of education: Not on file  ? Highest education level: Not on file  ?Occupational History  ? Occupation: Works at [employer]  ?Tobacco Use  ? Smoking status: Former  ?  Packs/day: 1.00  ?  Years: 10.00  ?  Pack years: 10.00  ?  Types: Cigarettes  ?  Quit date: 08/10/1982  ?  Years since quitting: 39.2  ? Smokeless tobacco: Never  ?Vaping Use  ? Vaping Use: Never used  ?Substance and Sexual Activity  ? Alcohol use: No  ? Drug use: No  ? Sexual activity: Not Currently  ?Other Topics Concern  ? Not on file  ?Social History Narrative  ? Not on file  ? ?Social Determinants of Health  ? ?Financial Resource Strain: Not on file  ?Food Insecurity: Not on file  ?Transportation Needs: Not on file  ?Physical Activity: Not on file  ?Stress: Not on file  ?Social Connections: Not on file  ?Intimate Partner Violence: Not on file  ? ? ?Family History  ?Problem Relation Age of Onset  ? Heart disease Mother 71  ?     CABG  ? Cancer Father   ?     lung  ? Heart disease Father 29  ?     CABG  ? ? ?Review of Systems:  As stated in the HPI and otherwise negative.  ? ?BP 136/68   Pulse (!) 54   Ht 5\' 8"  (1.727 m)   Wt 259 lb 9.6 oz (117.8 kg)   SpO2 99%   BMI 39.47 kg/m?  ? ?Physical Examination: ?General: Well developed, well nourished, NAD  ?HEENT: OP clear, mucus membranes moist  ?SKIN: warm, dry. No rashes. ?Neuro: No focal deficits  ?Musculoskeletal: Muscle strength 5/5 all ext  ?Psychiatric: Mood and affect normal  ?Neck: No JVD, no carotid bruits, no thyromegaly, no lymphadenopathy.  ?Lungs:Clear bilaterally, no wheezes, rhonci, crackles ?Cardiovascular: Regular rate and rhythm. No murmurs, gallops or rubs. ?Abdomen:Soft. Bowel  sounds present. Non-tender.  ?Extremities: No lower extremity edema. Pulses are 2 + in the bilateral DP/PT. ? ?EKG:  EKG is ordered today. ?The ekg  ordered today demonstrates sinus bradycardia, rate 54 bpm.  ? ?Recent Labs: ?No results found for requested labs within last 8760 hours.  ? ?Lipid Panel ?No results found for: CHOL, TRIG, HDL, CHOLHDL, VLDL, LDLCALC, LDLDIRECT ?  ?Wt Readings from Last 3 Encounters:  ?11/13/21 259 lb 9.6 oz (117.8 kg)  ?12/07/19 254 lb 8 oz (115.4 kg)  ?12/06/19 255 lb (115.7 kg)  ?  ? ?Assessment and Plan:  ? ?1. Chest pain: He has risk factors for CAD including DM, HTN, HLD, former tobacco abuse and obesity. His chest pain is at rest. Given risk factors, will exclude obstructive CAD with coronary CTA. Will also arrange an echo to assess LVEF and exclude structural heart disease.  ? ? ?Labs/ tests ordered today include:  ? ?Orders Placed This Encounter  ?Procedures  ? CT CORONARY MORPH W/CTA COR W/SCORE W/CA W/CM &/OR WO/CM  ? EKG 12-Lead  ? ECHOCARDIOGRAM COMPLETE  ? ?Disposition:   F/U with me in 8-12 weeks.  ? ? ?Signed, ?Verne Carrowhristopher Albeiro Trompeter, MD ?11/13/2021 10:52 AM    ?[facility] ?[address], [place], [place]  [address] ?Phone: [phone]; Fax: [phone]  ? ? ?

## 2021-11-20 ENCOUNTER — Other Ambulatory Visit (HOSPITAL_COMMUNITY): Payer: Self-pay

## 2021-11-20 MED ORDER — LISINOPRIL 40 MG PO TABS
40.0000 mg | ORAL_TABLET | Freq: Every day | ORAL | 11 refills | Status: DC
  Filled 2021-11-20: qty 90, 90d supply, fill #0
  Filled 2022-02-17: qty 30, 30d supply, fill #1
  Filled 2022-03-15 – 2022-03-28 (×2): qty 30, 30d supply, fill #2
  Filled 2022-04-25: qty 30, 30d supply, fill #3
  Filled 2022-05-22: qty 30, 30d supply, fill #4
  Filled 2022-06-30: qty 30, 30d supply, fill #5
  Filled 2022-08-06 – 2022-08-07 (×2): qty 30, 30d supply, fill #6
  Filled 2022-08-27 – 2022-08-31 (×2): qty 30, 30d supply, fill #7
  Filled 2022-09-30: qty 30, 30d supply, fill #8
  Filled 2022-10-27: qty 30, 30d supply, fill #9

## 2021-11-26 ENCOUNTER — Telehealth (HOSPITAL_COMMUNITY): Payer: Self-pay | Admitting: *Deleted

## 2021-11-26 NOTE — Telephone Encounter (Signed)
Reaching out to patient to offer assistance regarding upcoming cardiac imaging study; pt verbalizes understanding of appt date/time, parking situation and where to check in, pre-test NPO status and medications ordered, and verified current allergies; name and call back number provided for further questions should they arise ? ?Larey Brick RN Navigator Cardiac Imaging ?[facility] ?[phone] office ?[phone] cell ? ?Patient to take his daily carvedilol  two hours prior to his cardiac CT scan.  He is aware to arrive at 4pm. ?

## 2021-11-27 ENCOUNTER — Ambulatory Visit (HOSPITAL_COMMUNITY): Payer: Medicare Other

## 2021-11-27 ENCOUNTER — Encounter (HOSPITAL_COMMUNITY): Payer: Self-pay

## 2021-11-27 ENCOUNTER — Ambulatory Visit (HOSPITAL_COMMUNITY)
Admission: RE | Admit: 2021-11-27 | Discharge: 2021-11-27 | Disposition: A | Discharge status: Home / Self Care | Payer: Medicare Other | Source: Ambulatory Visit | Attending: Cardiovascular Disease | Admitting: Cardiovascular Disease

## 2021-11-27 DIAGNOSIS — R072 Precordial pain: Secondary | ICD-10-CM

## 2021-11-27 MED ORDER — NITROGLYCERIN 0.4 MG SL SUBL
SUBLINGUAL_TABLET | SUBLINGUAL | Status: AC
  Filled 2021-11-27: qty 2

## 2021-11-27 MED ORDER — NITROGLYCERIN 0.4 MG SL SUBL
0.8000 mg | SUBLINGUAL_TABLET | Freq: Once | SUBLINGUAL | Status: AC
  Administered 2021-11-27: 0.8 mg via SUBLINGUAL

## 2021-11-27 MED ORDER — IOHEXOL 350 MG/ML SOLN
100.0000 mL | Freq: Once | INTRAVENOUS | Status: AC | PRN
  Administered 2021-11-27: 100 mL via INTRAVENOUS

## 2021-11-28 ENCOUNTER — Other Ambulatory Visit (HOSPITAL_COMMUNITY): Payer: Self-pay

## 2021-11-28 ENCOUNTER — Ambulatory Visit (HOSPITAL_COMMUNITY): Payer: Medicare Other | Attending: Internal Medicine

## 2021-11-28 DIAGNOSIS — R079 Chest pain, unspecified: Secondary | ICD-10-CM | POA: Diagnosis present

## 2021-11-28 DIAGNOSIS — R072 Precordial pain: Secondary | ICD-10-CM | POA: Diagnosis present

## 2021-11-28 LAB — ECHOCARDIOGRAM COMPLETE
Area-P 1/2: 2.43 cm2
S' Lateral: 3.3 cm

## 2021-12-01 ENCOUNTER — Other Ambulatory Visit (HOSPITAL_COMMUNITY): Payer: Self-pay

## 2021-12-01 MED ORDER — AMLODIPINE BESYLATE 5 MG PO TABS
ORAL_TABLET | ORAL | 3 refills | Status: DC
  Filled 2021-12-01: qty 30, 30d supply, fill #0
  Filled 2022-01-01: qty 30, 30d supply, fill #1
  Filled 2022-01-28 – 2022-04-25 (×2): qty 30, 30d supply, fill #2
  Filled 2022-06-02: qty 30, 30d supply, fill #3

## 2021-12-01 MED ORDER — ALPRAZOLAM 0.5 MG PO TABS
ORAL_TABLET | ORAL | 0 refills | Status: DC
  Filled 2021-12-01: qty 30, 10d supply, fill #0

## 2021-12-02 ENCOUNTER — Other Ambulatory Visit (HOSPITAL_COMMUNITY): Payer: Self-pay

## 2021-12-11 ENCOUNTER — Other Ambulatory Visit (HOSPITAL_COMMUNITY): Payer: Self-pay

## 2021-12-17 ENCOUNTER — Other Ambulatory Visit (HOSPITAL_COMMUNITY): Payer: Self-pay

## 2021-12-18 ENCOUNTER — Other Ambulatory Visit (HOSPITAL_COMMUNITY): Payer: Self-pay

## 2021-12-18 MED ORDER — GLIMEPIRIDE 4 MG PO TABS
4.0000 mg | ORAL_TABLET | Freq: Two times a day (BID) | ORAL | 3 refills | Status: DC
  Filled 2021-12-18: qty 60, 30d supply, fill #0
  Filled 2022-01-15: qty 60, 30d supply, fill #1
  Filled 2022-02-11: qty 60, 30d supply, fill #2
  Filled 2022-03-15: qty 60, 30d supply, fill #3

## 2021-12-19 ENCOUNTER — Other Ambulatory Visit (HOSPITAL_COMMUNITY): Payer: Self-pay

## 2021-12-20 ENCOUNTER — Other Ambulatory Visit (HOSPITAL_COMMUNITY): Payer: Self-pay

## 2021-12-22 ENCOUNTER — Other Ambulatory Visit (HOSPITAL_COMMUNITY): Payer: Self-pay

## 2021-12-22 MED ORDER — UNIFINE PENTIPS 31G X 8 MM MISC
1 refills | Status: DC
  Filled 2021-12-22: qty 100, 25d supply, fill #0
  Filled 2022-07-30 – 2022-11-06 (×2): qty 100, 25d supply, fill #1

## 2021-12-26 ENCOUNTER — Other Ambulatory Visit (HOSPITAL_COMMUNITY): Payer: Self-pay

## 2021-12-26 MED ORDER — ONETOUCH VERIO VI STRP
ORAL_STRIP | 2 refills | Status: DC
  Filled 2021-12-26: qty 100, 30d supply, fill #0

## 2021-12-26 MED ORDER — UNIFINE PENTIPS 31G X 8 MM MISC
1 refills | Status: DC
  Filled 2021-12-26: qty 100, 30d supply, fill #0

## 2021-12-26 MED ORDER — ONETOUCH VERIO VI STRP
ORAL_STRIP | 2 refills | Status: DC
  Filled 2021-12-26 – 2022-04-30 (×2): qty 100, 30d supply, fill #0
  Filled 2022-10-27: qty 100, 30d supply, fill #1

## 2021-12-29 ENCOUNTER — Other Ambulatory Visit (HOSPITAL_COMMUNITY): Payer: Self-pay

## 2021-12-31 ENCOUNTER — Ambulatory Visit (INDEPENDENT_AMBULATORY_CARE_PROVIDER_SITE_OTHER): Payer: Medicare Other

## 2021-12-31 ENCOUNTER — Ambulatory Visit (INDEPENDENT_AMBULATORY_CARE_PROVIDER_SITE_OTHER): Payer: Medicare Other | Admitting: Orthopaedic Surgery

## 2021-12-31 VITALS — Ht 68.0 in | Wt 259.0 lb

## 2021-12-31 DIAGNOSIS — M25561 Pain in right knee: Secondary | ICD-10-CM | POA: Diagnosis not present

## 2021-12-31 DIAGNOSIS — M1711 Unilateral primary osteoarthritis, right knee: Secondary | ICD-10-CM

## 2021-12-31 DIAGNOSIS — G8929 Other chronic pain: Secondary | ICD-10-CM

## 2021-12-31 NOTE — Progress Notes (Signed)
Tony Morrison is well-known to me.  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  We actually scheduled him for knee replacement 2 years ago but his hemoglobin A1c was over 8.  Over time he is worked on his diabetic control and his hemoglobin A1c recently was down to 6.5.  He still ambulates using a walking stick and he has had worsening pain with his right knee especially the medial joint line.  A MRI a few years ago showed full-thickness cartilage loss of the medial compartment the knee with moderate cartilage loss of the lateral compartment and patellofemoral joint.  At this point he does have daily knee pain and is detrimentally affecting his mobility, his quality of life and his activities of daily living.  He wants to reschedule knee replacement at this standpoint and I agree with this as well.  He said no acute change in medical status.  He denies any headache, chest pain, shortness of breath, fever, chills, nausea, vomiting  Examination today of his right knee shows varus malalignment.  There is significant medial joint line tenderness and some lateral tenderness as well as patellofemoral crepitation.  The knee moves well and is stable ligamentously.  X-rays today of the right knee shows tricompartment arthritis with varus malalignment.  At this point we will once again proceed with scheduling a right total knee arthroplasty.  I described again what the surgery involves and we discussed the risk and benefits of surgery.  All questions and concerns were answered and addressed.  We will work on getting this scheduled.

## 2022-01-01 ENCOUNTER — Other Ambulatory Visit (HOSPITAL_COMMUNITY): Payer: Self-pay

## 2022-01-09 ENCOUNTER — Other Ambulatory Visit (HOSPITAL_COMMUNITY): Payer: Self-pay

## 2022-01-11 NOTE — Progress Notes (Unsigned)
Cardiology Office Note:    Date:  01/13/2022   ID:  Tony Morrison, DOB 1956-04-29, MRN [id number]  PCP:  [name], MD   [facility] Providers Cardiologist:  Verne Carrow, MD     Referring MD: [name], MD   Chief Complaint: follow-up chest pain, preoperative cardiac evaluation  History of Present Illness:    Tony Morrison is a very pleasant 66 y.o. male with a hx of CAD, diabetes, hypertension, hyperlipidemia, anxiety, depression, GERD, and sleep apnea.   He had cardiac catheterization in 2009 that showed mild CAD and normal LV systolic function.  Previously seen by Dr. Gala Romney for management of hypertension  He reestablished care and was seen by Dr. Clifton James on 11/13/2021 with right-sided chest pressure that begins at rest and may radiate to his left chest or left back.  No exertional chest pressure. Episodes last for an hour.  No associated dyspnea, dizziness, or diaphoresis. EKG revealed SB at 54 bpm.  Coronary CTA revealed calcium score of 335, 76 percentile for age/sex/race matched controls, mild nonobstructive CAD (25 to 49%). 2D echo revealed hyperdynamic LV function, G1 DD, mild LVH, no significant valve abnormality. He was advised to follow up in 2-3 months.  Today, he is here alone for follow-up.  He states chest pain has resolved.  Feels that it was likely anxiety related to concern for coronary stenosis. 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 Has struggled to lose weight and keep A1C at goal. Has upcoming total knee replacement that was originally planned 2 years ago but A1c was too high. Activity is limited by knee pain. He denies chest pain, shortness of breath, lower extremity edema, fatigue, palpitations, melena, hematuria, hemoptysis, diaphoresis, weakness, presyncope, syncope, orthopnea, and PND.  He requests clearance for his upcoming knee surgery which is currently scheduled for early August.  Past  Medical History:  Diagnosis Date   Anxiety    Arthritis    Chest pressure    cardiac cath; minimal non-obs CAD and normal EF 06/2008   Depression    Diabetes mellitus    x5 years  type 2   Family history of anesthesia complication    mother n/v   GERD (gastroesophageal reflux disease)    Glaucoma    Headache(784.0)     headaches   Cluster   Heart murmur    slight per pcp that is retired [name]  no issues    History of peptic ulcer disease    Hyperlipidemia    Hypertension    s/p syncopal episode with facial trauma due to orthostasis 10/2008. diuretic held   Obesity    PONV (postoperative nausea and vomiting)    once   Sleep apnea    dont use CPAP   Syncopal episodes    likely due to orthostatic hypotension    Past Surgical History:  Procedure Laterality Date   left knee surgery     medial meniscus repair x2   MOUTH SURGERY     to make palate wider   NASAL HEMORRHAGE CONTROL N/A 03/24/2013   Procedure: EPISTAXIS CONTROL;  Surgeon: [name], MD;  Location: [facility] OR;  Service: ENT;  Laterality: N/A;   NASAL SEPTOPLASTY W/ TURBINOPLASTY N/A 03/24/2013   Procedure: NASAL SEPTOPLASTY WITH TURBINATE REDUCTION;  Surgeon: [name], MD;  Location: [facility] OR;  Service: ENT;  Laterality: N/A;   NECK SURGERY     C5-C6   VASECTOMY  Current Medications: Current Meds  Medication Sig   ALPRAZolam (XANAX) 0.5 MG tablet Take one tablet (0.5 mg dose) by mouth 3 (three) times a day as needed for Sleep or Anxiety.   amLODipine (NORVASC) 5 MG tablet Take 1 tablet by mouth daily.   aspirin EC 81 MG tablet Take 81 mg by mouth daily.   carvedilol (COREG) 25 MG tablet Take 1 tablet (25 mg dose) by mouth 2 (two) times daily with a meal.   fenofibrate 160 MG tablet Take 1 tablet by mouth daily.   FLUoxetine (PROZAC) 40 MG capsule Take 1 capsule by mouth daily.   gabapentin (NEURONTIN) 100 MG capsule Take 1 capsule (100 mg total) by mouth 3 (three) times daily.   glimepiride  (AMARYL) 4 MG tablet Take 1 tablet by mouth 2 times daily.   hydrochlorothiazide (HYDRODIURIL) 25 MG tablet Take one half tablet (12.5 mg dose) by mouth daily.   insulin lispro (HUMALOG KWIKPEN) 100 UNIT/ML KwikPen Inject 10 Units into the skin 3 times daily with meals. (Patient taking differently: Inject 30 Units into the skin daily.)   LANTUS SOLOSTAR 100 UNIT/ML Solostar Pen INJECT 70 UNITS INTO THE SKIN EVERY MORNING   ONETOUCH VERIO test strip Use to test blood sugar 3 times a day   pantoprazole (PROTONIX) 40 MG tablet Take 1 tablet (40 mg dose) by mouth daily.   rosuvastatin (CRESTOR) 5 MG tablet Take 1 tablet by mouth daily.   SitaGLIPtin-MetFORMIN HCl (JANUMET XR) 50-1000 MG TB24 Take 1 tablet by mouth daily.   UNIFINE PENTIPS 31G X 8 MM MISC USE AS DIRECTED   UNIFINE PENTIPS 31G X 8 MM MISC USE AS DIRECTED     Allergies:   Codeine and Prednisone   Social History   Socioeconomic History   Marital status: Married    Spouse name: Not on file   Number of children: 1   Years of education: Not on file   Highest education level: Not on file  Occupational History   Occupation: Works at [employer]  Tobacco Use   Smoking status: Former    Packs/day: 1.00    Years: 10.00    Pack years: 10.00    Types: Cigarettes    Quit date: 08/10/1982    Years since quitting: 39.4   Smokeless tobacco: Never  Vaping Use   Vaping Use: Never used  Substance and Sexual Activity   Alcohol use: No   Drug use: No   Sexual activity: Not Currently  Other Topics Concern   Not on file  Social History Narrative   Not on file   Social Determinants of Health   Financial Resource Strain: Not on file  Food Insecurity: Not on file  Transportation Needs: Not on file  Physical Activity: Not on file  Stress: Not on file  Social Connections: Not on file     Family History: The patient's family history includes Cancer in his father; Heart disease (age of onset: 40) in his mother; Heart disease  (age of onset: 35) in his father.  ROS:   Please see the history of present illness.     All other systems reviewed and are negative.  Labs/Other Studies Reviewed:    The following studies were reviewed today:  Coronary CTA 11/28/21  1. Coronary calcium score of 335. This was 76th percentile for age-, sex, and race-matched controls.   2. Normal coronary origin with left dominance.   3. Mild calcified plaque (25-49%) in the LAD/LCX.   RECOMMENDATIONS: 1.  Mild non-obstructive CAD (25-49%). Consider non-atherosclerotic causes of chest pain. Consider preventive therapy and risk factor modification.   Echo 11/28/21   1. Left ventricular ejection fraction, by estimation, is 70 to 75%. The  left ventricle has hyperdynamic function. The left ventricle has no  regional wall motion abnormalities. There is mild left ventricular  hypertrophy. Left ventricular diastolic  parameters are consistent with Grade I diastolic dysfunction (impaired  relaxation).   2. Right ventricular systolic function is normal. The right ventricular  size is normal. There is normal pulmonary artery systolic pressure. The  estimated right ventricular systolic pressure is 33.5 mmHg.   3. Left atrial size was mildly dilated.   4. The mitral valve is grossly normal. Trivial mitral valve  regurgitation.   5. The aortic valve is tricuspid. Aortic valve regurgitation is not  visualized.   6. Aortic dilatation noted. There is borderline dilatation of the aortic  root, measuring 38 mm.   7. The inferior vena cava is normal in size with greater than 50%  respiratory variability, suggesting right atrial pressure of 3 mmHg.   Comparison(s): No prior Echocardiogram.    Recent Labs: From Care Everywhere   Risk Assessment/Calculations:      Physical Exam:    VS:  BP 138/60   Pulse 62   Ht 5\' 8"  (1.727 m)   Wt 260 lb 12.8 oz (118.3 kg)   SpO2 95%   BMI 39.65 kg/m     Wt Readings from Last 3 Encounters:   01/13/22 260 lb 12.8 oz (118.3 kg)  12/31/21 259 lb (117.5 kg)  11/13/21 259 lb 9.6 oz (117.8 kg)     GEN:  Well nourished, well developed in no acute distress HEENT: Normal NECK: No JVD; No carotid bruits CARDIAC: RRR, no murmurs, rubs, gallops RESPIRATORY:  Clear to auscultation without rales, wheezing or rhonchi  ABDOMEN: Soft, non-tender, non-distended MUSCULOSKELETAL:  No edema; No deformity. 2+ pedal pulses, equal bilaterally SKIN: Warm and dry NEUROLOGIC:  Alert and oriented x 3 PSYCHIATRIC:  Normal affect   EKG:  EKG is not ordered today.    Diagnoses:    1. Preop cardiovascular exam   2. Coronary artery disease involving native coronary artery of native heart without angina pectoris   3. Essential hypertension   4. Hyperlipidemia LDL goal <70    Assessment and Plan:     Preoperative cardiac evaluation: He is doing well and may proceed to surgery without further cardiac testing.  According to the Revised Cardiac Risk Index (RCRI), his Perioperative Risk of Major Cardiac Event is (%): 0.9. His Functional Capacity in METs is: 4.64 according to the Duke Activity Status Index (DASI).  He may hold aspirin for surgery in the setting of no recent coronary intervention and resume as soon as hemodynamically stable.  Chest pain: He denies chest pain, dyspnea, or other symptoms concerning for angina. Feels that symptoms improved following CT that revealed no obstructive CAD.  No indication for further ischemic evaluation at this time.   CAD without angina: Mild non-obstructive CAD (25-49%) in LAD/LCX by coronary CTA 11/2019. No indication for further ischemia evaluation at this time. Good cholesterol control encouraged.   Hypertension: BP is well-controlled today.  No medication changes today.  Hyperlipidemia LDL goal < 70: LDL 81 on 10/29/21. Reports awareness of dietary changes that he should make for weight loss, better A1c control and improved LDL cholesterol, continues to work  on implementing these changes.  Continue Crestor, fenofibrate.   Disposition: 1  year with Dr. Clifton James   Medication Adjustments/Labs and Tests Ordered: Current medicines are reviewed at length with the patient today.  Concerns regarding medicines are outlined above.  No orders of the defined types were placed in this encounter.  No orders of the defined types were placed in this encounter.   Patient Instructions  Medication Instructions:   Your physician recommends that you continue on your current medications as directed. Please refer to the Current Medication list given to you today.   *If you need a refill on your cardiac medications before your next appointment, please call your pharmacy*   Lab Work:  None ordered.  If you have labs (blood work) drawn today and your tests are completely normal, you will receive your results only by: MyChart Message (if you have MyChart) OR A paper copy in the mail If you have any lab test that is abnormal or we need to change your treatment, we will call you to review the results.   Testing/Procedures:   None ordered.   Follow-Up: At [facility], you and your health needs are our priority.  As part of our continuing mission to provide you with exceptional heart care, we have created designated Provider Care Teams.  These Care Teams include your primary Cardiologist (physician) and Advanced Practice Providers (APPs -  Physician Assistants and Nurse Practitioners) who all work together to provide you with the care you need, when you need it.  We recommend signing up for the patient portal called "MyChart".  Sign up information is provided on this After Visit Summary.  MyChart is used to connect with patients for Virtual Visits (Telemedicine).  Patients are able to view lab/test results, encounter notes, upcoming appointments, etc.  Non-urgent messages can be sent to your provider as well.   To learn more about what you can do with  MyChart, go to [email or web address].    Your next appointment:   1 year(s)  The format for your next appointment:   In Person  Provider:   Verne Carrow, MD     Other Instructions  Your physician wants you to follow-up in: 1 year with Dr. Clifton James.  You will receive a reminder letter in the mail two months in advance. If you don't receive a letter, please call our office to schedule the follow-up appointment.   Important Information About Sugar         Signed, Levi Aland, NP  01/13/2022 3:40 PM    [facility]

## 2022-01-12 ENCOUNTER — Telehealth: Payer: Self-pay | Admitting: *Deleted

## 2022-01-12 NOTE — Telephone Encounter (Signed)
Pre-operative Risk Assessment    Patient Name: Tony Morrison  DOB: 02/04/1956 MRN: [id number]      Request for Surgical Clearance    Procedure:   RIGHT TOTAL KNEE ARTHROPLASTY  Date of Surgery:  Clearance 03/13/22                                 Surgeon:  DR. Doneen Poisson Surgeon's Group or Practice Name:  [facility] Phone number:  [phone] Fax number:  [phone] ATTN: SHERRIE   Type of Clearance Requested:   - Medical ; ASA    Type of Anesthesia:  Spinal & BLOCK   Additional requests/questions:    Elpidio Anis   01/12/2022, 3:28 PM

## 2022-01-13 ENCOUNTER — Ambulatory Visit (INDEPENDENT_AMBULATORY_CARE_PROVIDER_SITE_OTHER): Payer: Medicare Other | Admitting: Nurse Practitioner

## 2022-01-13 ENCOUNTER — Encounter: Payer: Self-pay | Admitting: Nurse Practitioner

## 2022-01-13 VITALS — BP 138/60 | HR 62 | Ht 68.0 in | Wt 260.8 lb

## 2022-01-13 DIAGNOSIS — Z01810 Encounter for preprocedural cardiovascular examination: Secondary | ICD-10-CM

## 2022-01-13 DIAGNOSIS — I10 Essential (primary) hypertension: Secondary | ICD-10-CM

## 2022-01-13 DIAGNOSIS — I2510 Atherosclerotic heart disease of native coronary artery without angina pectoris: Secondary | ICD-10-CM | POA: Diagnosis not present

## 2022-01-13 DIAGNOSIS — E785 Hyperlipidemia, unspecified: Secondary | ICD-10-CM

## 2022-01-13 NOTE — Patient Instructions (Signed)
Medication Instructions:   Your physician recommends that you continue on your current medications as directed. Please refer to the Current Medication list given to you today.   *If you need a refill on your cardiac medications before your next appointment, please call your pharmacy*   Lab Work:  None ordered.  If you have labs (blood work) drawn today and your tests are completely normal, you will receive your results only by: [place] (if you have MyChart) OR A paper copy in the mail If you have any lab test that is abnormal or we need to change your treatment, we will call you to review the results.   Testing/Procedures:   None ordered.   Follow-Up: At [facility], you and your health needs are our priority.  As part of our continuing mission to provide you with exceptional heart care, we have created designated Provider Care Teams.  These Care Teams include your primary Cardiologist (physician) and Advanced Practice Providers (APPs -  Physician Assistants and Nurse Practitioners) who all work together to provide you with the care you need, when you need it.  We recommend signing up for the patient portal called "MyChart".  Sign up information is provided on this After Visit Summary.  MyChart is used to connect with patients for Virtual Visits (Telemedicine).  Patients are able to view lab/test results, encounter notes, upcoming appointments, etc.  Non-urgent messages can be sent to your provider as well.   To learn more about what you can do with MyChart, go to [email or web address].    Your next appointment:   1 year(s)  The format for your next appointment:   In Person  Provider:   Lauree Chandler, MD     Other Instructions  Your physician wants you to follow-up in: 1 year with Dr. Angelena Form.  You will receive a reminder letter in the mail two months in advance. If you don't receive a letter, please call our office to schedule the follow-up  appointment.   Important Information About Sugar

## 2022-01-15 ENCOUNTER — Other Ambulatory Visit (HOSPITAL_COMMUNITY): Payer: Self-pay

## 2022-01-16 ENCOUNTER — Other Ambulatory Visit (HOSPITAL_COMMUNITY): Payer: Self-pay

## 2022-01-16 MED ORDER — JANUMET XR 50-1000 MG PO TB24
1.0000 | ORAL_TABLET | Freq: Every day | ORAL | 3 refills | Status: DC
  Filled 2022-01-16 – 2022-03-28 (×2): qty 30, 30d supply, fill #0
  Filled 2022-04-18: qty 30, 30d supply, fill #1
  Filled 2022-05-22: qty 30, 30d supply, fill #2

## 2022-01-16 MED ORDER — AMLODIPINE BESYLATE 5 MG PO TABS
ORAL_TABLET | ORAL | 3 refills | Status: DC
  Filled 2022-01-16: qty 30, 30d supply, fill #0
  Filled 2022-02-23: qty 30, 30d supply, fill #1
  Filled 2022-03-30: qty 30, 30d supply, fill #2

## 2022-01-16 MED ORDER — JANUMET XR 50-1000 MG PO TB24
1.0000 | ORAL_TABLET | Freq: Every day | ORAL | 3 refills | Status: DC
  Filled 2022-01-16: qty 30, 30d supply, fill #0
  Filled 2022-02-11: qty 30, 30d supply, fill #1
  Filled 2022-03-15 – 2022-05-26 (×2): qty 30, 30d supply, fill #2
  Filled 2022-06-17 – 2022-06-18 (×2): qty 30, 30d supply, fill #3

## 2022-01-20 ENCOUNTER — Other Ambulatory Visit (HOSPITAL_COMMUNITY): Payer: Self-pay

## 2022-01-21 ENCOUNTER — Other Ambulatory Visit (HOSPITAL_COMMUNITY): Payer: Self-pay

## 2022-01-24 ENCOUNTER — Other Ambulatory Visit (HOSPITAL_COMMUNITY): Payer: Self-pay

## 2022-01-27 ENCOUNTER — Other Ambulatory Visit (HOSPITAL_COMMUNITY): Payer: Self-pay

## 2022-01-28 ENCOUNTER — Other Ambulatory Visit: Payer: Self-pay

## 2022-01-28 ENCOUNTER — Other Ambulatory Visit (HOSPITAL_COMMUNITY): Payer: Self-pay

## 2022-02-04 ENCOUNTER — Other Ambulatory Visit (HOSPITAL_COMMUNITY): Payer: Self-pay

## 2022-02-04 MED ORDER — FERROUS SULFATE 325 (65 FE) MG PO TBEC: DELAYED_RELEASE_TABLET | ORAL | 3 refills | Status: DC

## 2022-02-04 MED ORDER — LEVOTHYROXINE SODIUM 50 MCG PO TABS
ORAL_TABLET | ORAL | 5 refills | Status: DC
Start: 2022-02-04 — End: 2022-05-26
  Filled 2022-02-04: qty 30, 30d supply, fill #0
  Filled 2022-02-23: qty 30, 30d supply, fill #1
  Filled 2022-02-27: qty 17, 17d supply, fill #1
  Filled 2022-03-04: qty 13, 13d supply, fill #1
  Filled 2022-03-28: qty 30, 30d supply, fill #2
  Filled 2022-04-25: qty 30, 30d supply, fill #3

## 2022-02-05 ENCOUNTER — Other Ambulatory Visit (HOSPITAL_COMMUNITY): Payer: Self-pay

## 2022-02-05 MED ORDER — FLUOXETINE HCL 40 MG PO CAPS
40.0000 mg | ORAL_CAPSULE | Freq: Every day | ORAL | 3 refills | Status: DC
  Filled 2022-02-05: qty 90, 90d supply, fill #0
  Filled 2022-04-30: qty 90, 90d supply, fill #1
  Filled 2022-08-06 – 2022-08-07 (×2): qty 90, 90d supply, fill #2
  Filled 2022-11-01: qty 30, 30d supply, fill #3
  Filled 2022-12-03: qty 30, 30d supply, fill #4
  Filled 2022-12-30: qty 30, 30d supply, fill #5

## 2022-02-11 ENCOUNTER — Other Ambulatory Visit (HOSPITAL_COMMUNITY): Payer: Self-pay

## 2022-02-11 MED ORDER — ROSUVASTATIN CALCIUM 5 MG PO TABS
5.0000 mg | ORAL_TABLET | Freq: Every day | ORAL | 3 refills | Status: DC
  Filled 2022-02-11: qty 30, 30d supply, fill #0
  Filled 2022-03-15 – 2022-03-28 (×2): qty 30, 30d supply, fill #1
  Filled 2022-04-25: qty 30, 30d supply, fill #2
  Filled 2022-05-22: qty 30, 30d supply, fill #3

## 2022-02-13 ENCOUNTER — Other Ambulatory Visit (HOSPITAL_COMMUNITY): Payer: Self-pay

## 2022-02-17 ENCOUNTER — Other Ambulatory Visit (HOSPITAL_COMMUNITY): Payer: Self-pay

## 2022-02-18 ENCOUNTER — Other Ambulatory Visit (HOSPITAL_COMMUNITY): Payer: Self-pay

## 2022-02-19 ENCOUNTER — Other Ambulatory Visit: Payer: Self-pay

## 2022-02-23 ENCOUNTER — Other Ambulatory Visit (HOSPITAL_COMMUNITY): Payer: Self-pay

## 2022-02-24 ENCOUNTER — Other Ambulatory Visit (HOSPITAL_COMMUNITY): Payer: Self-pay

## 2022-02-25 ENCOUNTER — Other Ambulatory Visit (HOSPITAL_COMMUNITY): Payer: Self-pay

## 2022-02-25 MED ORDER — SUTAB 1479-225-188 MG PO TABS
ORAL_TABLET | ORAL | 0 refills | Status: DC
  Filled 2022-02-25: qty 24, 1d supply, fill #0

## 2022-03-03 ENCOUNTER — Other Ambulatory Visit (HOSPITAL_COMMUNITY): Payer: Self-pay

## 2022-03-04 ENCOUNTER — Other Ambulatory Visit (HOSPITAL_COMMUNITY): Payer: Self-pay

## 2022-03-08 ENCOUNTER — Other Ambulatory Visit (HOSPITAL_COMMUNITY): Payer: Self-pay

## 2022-03-09 ENCOUNTER — Other Ambulatory Visit (HOSPITAL_COMMUNITY): Payer: Self-pay

## 2022-03-09 MED ORDER — FENOFIBRATE 160 MG PO TABS
160.0000 mg | ORAL_TABLET | Freq: Every day | ORAL | 5 refills | Status: DC
  Filled 2022-03-09: qty 30, 30d supply, fill #0
  Filled 2022-04-13: qty 30, 30d supply, fill #1
  Filled 2022-05-11: qty 30, 30d supply, fill #2
  Filled 2022-06-17: qty 30, 30d supply, fill #3
  Filled 2022-07-11: qty 30, 30d supply, fill #4
  Filled 2022-08-10: qty 30, 30d supply, fill #5

## 2022-03-16 ENCOUNTER — Other Ambulatory Visit (HOSPITAL_COMMUNITY): Payer: Self-pay

## 2022-03-17 ENCOUNTER — Other Ambulatory Visit (HOSPITAL_COMMUNITY): Payer: Self-pay

## 2022-03-18 ENCOUNTER — Other Ambulatory Visit (HOSPITAL_COMMUNITY): Payer: Self-pay

## 2022-03-21 ENCOUNTER — Other Ambulatory Visit (HOSPITAL_COMMUNITY): Payer: Self-pay

## 2022-03-24 ENCOUNTER — Other Ambulatory Visit (HOSPITAL_COMMUNITY): Payer: Self-pay

## 2022-03-24 ENCOUNTER — Encounter: Payer: Self-pay | Admitting: Orthopaedic Surgery

## 2022-03-24 NOTE — Patient Instructions (Signed)
SURGICAL WAITING ROOM VISITATION Patients having surgery or a procedure may have no more than 2 support people in the waiting area - these visitors may rotate.   Children under the age of 42 must have an adult with them who is not the patient. If the patient needs to stay at the hospital during part of their recovery, the visitor guidelines for inpatient rooms apply. Pre-op nurse will coordinate an appropriate time for 1 support person to accompany patient in pre-op.  This support person may not rotate.    Please refer to the [facility] website for the visitor guidelines for Inpatients (after your surgery is over and you are in a regular room).    Your procedure is scheduled on: 04/03/22   Report to [facility] Main Entrance    Report to admitting at 9:20 AM   Call this number if you have problems the morning of surgery [phone]   Do not eat food :After Midnight.   After Midnight you may have the following liquids until 8:50 AM DAY OF SURGERY  Water Non-Citrus Juices (without pulp, NO RED) Carbonated Beverages Black Coffee (NO MILK/CREAM OR CREAMERS, sugar ok)  Clear Tea (NO MILK/CREAM OR CREAMERS, sugar ok) regular and decaf                             Plain Jell-O (NO RED)                                           Fruit ices (not with fruit pulp, NO RED)                                     Popsicles (NO RED)                                                               Sports drinks like Gatorade (NO RED)               The day of surgery:  Drink ONE (1) Pre-Surgery G2 at 8:50 AM the morning of surgery. Drink in one sitting. Do not sip.  This drink was given to you during your hospital  pre-op appointment visit. Nothing else to drink after completing the  Pre-Surgery G2.          If you have questions, please contact your surgeon's office.   FOLLOW BOWEL PREP AND ANY ADDITIONAL PRE OP INSTRUCTIONS YOU RECEIVED FROM YOUR SURGEON'S OFFICE!!!     Oral Hygiene is  also important to reduce your risk of infection.                                    Remember - BRUSH YOUR TEETH THE MORNING OF SURGERY WITH YOUR REGULAR TOOTHPASTE   Take these medicines the morning of surgery with A SIP OF WATER: Xanax, Amlodipine, Carvedilol, Fenofibrate, Fluoxetine, Gabapentin, Levothyroxine, Pantoprazole, Rosuvastatin.   DO NOT TAKE ANY ORAL DIABETIC MEDICATIONS DAY OF YOUR SURGERY  How  to Manage Your Diabetes Before and After Surgery  Why is it important to control my blood sugar before and after surgery? Improving blood sugar levels before and after surgery helps healing and can limit problems. A way of improving blood sugar control is eating a healthy diet by:  Eating less sugar and carbohydrates  Increasing activity/exercise  Talking with your doctor about reaching your blood sugar goals High blood sugars (greater than 180 mg/dL) can raise your risk of infections and slow your recovery, so you will need to focus on controlling your diabetes during the weeks before surgery. Make sure that the doctor who takes care of your diabetes knows about your planned surgery including the date and location.  How do I manage my blood sugar before surgery? Check your blood sugar at least 4 times a day, starting 2 days before surgery, to make sure that the level is not too high or low. Check your blood sugar the morning of your surgery when you wake up and every 2 hours until you get to the Short Stay unit. If your blood sugar is less than 70 mg/dL, you will need to treat for low blood sugar: Do not take insulin. Treat a low blood sugar (less than 70 mg/dL) with  cup of clear juice (cranberry or apple), 4 glucose tablets, OR glucose gel. Recheck blood sugar in 15 minutes after treatment (to make sure it is greater than 70 mg/dL). If your blood sugar is not greater than 70 mg/dL on recheck, call [phone] for further instructions. Report your blood sugar to the short stay nurse  when you get to Short Stay.  If you are admitted to the hospital after surgery: Your blood sugar will be checked by the staff and you will probably be given insulin after surgery (instead of oral diabetes medicines) to make sure you have good blood sugar levels. The goal for blood sugar control after surgery is 80-180 mg/dL.   WHAT DO I DO ABOUT MY DIABETES MEDICATION?  Do not take oral diabetes medicines (pills) the morning of surgery.  THE DAY BEFORE SURGERY, take morning dose of Glimepiride, no evening or night time dose. Take Humalog before meals as prescribed, no bedtime dose. Take Lantus as prescribed. Take Janumet as prescribed.     THE MORNING OF SURGERY, do not take Glimepiride or Janumet. Do not take Humalog unless blood sugar is greater than 220, then take 50% of dose. Take 50% of Lantus  The day of surgery, do not take other diabetes injectables, including Byetta (exenatide), Bydureon (exenatide ER), Victoza (liraglutide), or Trulicity (dulaglutide).  If your CBG is greater than 220 mg/dL, you may take  of your sliding scale  (correction) dose of insulin.  Reviewed and Endorsed by [facility] Patient Education Committee, August 2015                               You may not have any metal on your body including jewelry, and body piercing             Do not wear lotions, powders, cologne, or deodorant             Men may shave face and neck.   Do not bring valuables to the hospital. [facility] IS NOT             RESPONSIBLE   FOR VALUABLES.   Bring small overnight bag day of surgery.  DO NOT BRING YOUR HOME MEDICATIONS TO THE HOSPITAL. PHARMACY WILL DISPENSE MEDICATIONS LISTED ON YOUR MEDICATION LIST TO YOU DURING YOUR ADMISSION IN THE HOSPITAL!    Special Instructions: Bring a copy of your healthcare power of attorney and living will documents         the day of surgery if you haven't scanned them before.              Please read over the following fact sheets you  were given: IF YOU HAVE QUESTIONS ABOUT YOUR PRE-OP INSTRUCTIONS PLEASE CALL [phone]- [facility]     [facility] - Preparing for Surgery Before surgery, you can play an important role.  Because skin is not sterile, your skin needs to be as free of germs as possible.  You can reduce the number of germs on your skin by washing with CHG (chlorahexidine gluconate) soap before surgery.  CHG is an antiseptic cleaner which kills germs and bonds with the skin to continue killing germs even after washing. Please DO NOT use if you have an allergy to CHG or antibacterial soaps.  If your skin becomes reddened/irritated stop using the CHG and inform your nurse when you arrive at Short Stay. Do not shave (including legs and underarms) for at least 48 hours prior to the first CHG shower.  You may shave your face/neck.  Please follow these instructions carefully:  1.  Shower with CHG Soap the night before surgery and the  morning of surgery.  2.  If you choose to wash your hair, wash your hair first as usual with your normal  shampoo.  3.  After you shampoo, rinse your hair and body thoroughly to remove the shampoo.                             4.  Use CHG as you would any other liquid soap.  You can apply chg directly to the skin and wash.  Gently with a scrungie or clean washcloth.  5.  Apply the CHG Soap to your body ONLY FROM THE NECK DOWN.   Do   not use on face/ open                           Wound or open sores. Avoid contact with eyes, ears mouth and   genitals (private parts).                       Wash face,  Genitals (private parts) with your normal soap.             6.  Wash thoroughly, paying special attention to the area where your    surgery  will be performed.  7.  Thoroughly rinse your body with warm water from the neck down.  8.  DO NOT shower/wash with your normal soap after using and rinsing off the CHG Soap.                9.  Pat yourself dry with a clean towel.            10.  Wear clean  pajamas.            11.  Place clean sheets on your bed the night of your first shower and do not  sleep with pets. Day of Surgery : Do not apply any lotions/deodorants the morning of surgery.  Please wear clean clothes to the hospital/surgery center.  FAILURE TO FOLLOW THESE INSTRUCTIONS MAY RESULT IN THE CANCELLATION OF YOUR SURGERY  PATIENT SIGNATURE_________________________________  NURSE SIGNATURE__________________________________  ________________________________________________________________________    WHAT IS A BLOOD TRANSFUSION? Blood Transfusion Information  A transfusion is the replacement of blood or some of its parts. Blood is made up of multiple cells which provide different functions. Red blood cells carry oxygen and are used for blood loss replacement. White blood cells fight against infection. Platelets control bleeding. Plasma helps clot blood. Other blood products are available for specialized needs, such as hemophilia or other clotting disorders. BEFORE THE TRANSFUSION  Who gives blood for transfusions?  Healthy volunteers who are fully evaluated to make sure their blood is safe. This is blood bank blood. Transfusion therapy is the safest it has ever been in the practice of medicine. Before blood is taken from a donor, a complete history is taken to make sure that person has no history of diseases nor engages in risky social behavior (examples are intravenous drug use or sexual activity with multiple partners). The donor's travel history is screened to minimize risk of transmitting infections, such as malaria. The donated blood is tested for signs of infectious diseases, such as HIV and hepatitis. The blood is then tested to be sure it is compatible with you in order to minimize the chance of a transfusion reaction. If you or a relative donates blood, this is often done in anticipation of surgery and is not appropriate for emergency situations. It takes many days to  process the donated blood. RISKS AND COMPLICATIONS Although transfusion therapy is very safe and saves many lives, the main dangers of transfusion include:  Getting an infectious disease. Developing a transfusion reaction. This is an allergic reaction to something in the blood you were given. Every precaution is taken to prevent this. The decision to have a blood transfusion has been considered carefully by your caregiver before blood is given. Blood is not given unless the benefits outweigh the risks. AFTER THE TRANSFUSION Right after receiving a blood transfusion, you will usually feel much better and more energetic. This is especially true if your red blood cells have gotten low (anemic). The transfusion raises the level of the red blood cells which carry oxygen, and this usually causes an energy increase. The nurse administering the transfusion will monitor you carefully for complications. HOME CARE INSTRUCTIONS  No special instructions are needed after a transfusion. You may find your energy is better. Speak with your caregiver about any limitations on activity for underlying diseases you may have. SEEK MEDICAL CARE IF:  Your condition is not improving after your transfusion. You develop redness or irritation at the intravenous (IV) site. SEEK IMMEDIATE MEDICAL CARE IF:  Any of the following symptoms occur over the next 12 hours: Shaking chills. You have a temperature by mouth above 102 F (38.9 C), not controlled by medicine. Chest, back, or muscle pain. People around you feel you are not acting correctly or are confused. Shortness of breath or difficulty breathing. Dizziness and fainting. You get a rash or develop hives. You have a decrease in urine output. Your urine turns a dark color or changes to pink, red, or brown. Any of the following symptoms occur over the next 10 days: You have a temperature by mouth above 102 F (38.9 C), not controlled by medicine. Shortness of  breath. Weakness after normal activity. The white part of the eye turns yellow (jaundice). You have a decrease  in the amount of urine or are urinating less often. Your urine turns a dark color or changes to pink, red, or brown. Document Released: 07/24/2000 Document Revised: 10/19/2011 Document Reviewed: 03/12/2008 ExitCare Patient Information 2014 [place], [place].  _______________________________________________________________________   Incentive Spirometer  An incentive spirometer is a tool that can help keep your lungs clear and active. This tool measures how well you are filling your lungs with each breath. Taking long deep breaths may help reverse or decrease the chance of developing breathing (pulmonary) problems (especially infection) following: A long period of time when you are unable to move or be active. BEFORE THE PROCEDURE  If the spirometer includes an indicator to show your best effort, your nurse or respiratory therapist will set it to a desired goal. If possible, sit up straight or lean slightly forward. Try not to slouch. Hold the incentive spirometer in an upright position. INSTRUCTIONS FOR USE  Sit on the edge of your bed if possible, or sit up as far as you can in bed or on a chair. Hold the incentive spirometer in an upright position. Breathe out normally. Place the mouthpiece in your mouth and seal your lips tightly around it. Breathe in slowly and as deeply as possible, raising the piston or the ball toward the top of the column. Hold your breath for 3-5 seconds or for as long as possible. Allow the piston or ball to fall to the bottom of the column. Remove the mouthpiece from your mouth and breathe out normally. Rest for a few seconds and repeat Steps 1 through 7 at least 10 times every 1-2 hours when you are awake. Take your time and take a few normal breaths between deep breaths. The spirometer may include an indicator to show your best effort. Use the indicator  as a goal to work toward during each repetition. After each set of 10 deep breaths, practice coughing to be sure your lungs are clear. If you have an incision (the cut made at the time of surgery), support your incision when coughing by placing a pillow or rolled up towels firmly against it. Once you are able to get out of bed, walk around indoors and cough well. You may stop using the incentive spirometer when instructed by your caregiver.  RISKS AND COMPLICATIONS Take your time so you do not get dizzy or light-headed. If you are in pain, you may need to take or ask for pain medication before doing incentive spirometry. It is harder to take a deep breath if you are having pain. AFTER USE Rest and breathe slowly and easily. It can be helpful to keep track of a log of your progress. Your caregiver can provide you with a simple table to help with this. If you are using the spirometer at home, follow these instructions: SEEK MEDICAL CARE IF:  You are having difficultly using the spirometer. You have trouble using the spirometer as often as instructed. Your pain medication is not giving enough relief while using the spirometer. You develop fever of 100.5 F (38.1 C) or higher. SEEK IMMEDIATE MEDICAL CARE IF:  You cough up bloody sputum that had not been present before. You develop fever of 102 F (38.9 C) or greater. You develop worsening pain at or near the incision site. MAKE SURE YOU:  Understand these instructions. Will watch your condition. Will get help right away if you are not doing well or get worse. Document Released: 12/07/2006 Document Revised: 10/19/2011 Document Reviewed: 02/07/2007 ExitCare Patient Information  2014 [employer].   ________________________________________________________________________

## 2022-03-24 NOTE — Progress Notes (Signed)
COVID Vaccine Completed: yes x2  Date of COVID positive in last 90 days:  PCP - Antony Haste, MD Cardiologist - Verne Carrow, MD  Cardiac clearance by Eligha Bridegroom 01/13/22 in Epic  Chest x-ray -  EKG - 11/13/21 Epic Stress Test -  ECHO - 11/28/21 Epic Cardiac Cath - 2009 Pacemaker/ICD device last checked: Spinal Cord Stimulator:  Bowel Prep -   Sleep Study -  CPAP -   Fasting Blood Sugar -  Checks Blood Sugar _____ times a day  Blood Thinner Instructions: Aspirin Instructions: ASA 81 Last Dose:  Activity level:  Can go up a flight of stairs and perform activities of daily living without stopping and without symptoms of chest pain or shortness of breath.  Able to exercise without symptoms  Unable to go up a flight of stairs without symptoms of     Anesthesia review: CAD, DM2, OSA, HTN  Patient denies shortness of breath, fever, cough and chest pain at PAT appointment  Patient verbalized understanding of instructions that were given to them at the PAT appointment. Patient was also instructed that they will need to review over the PAT instructions again at home before surgery.

## 2022-03-24 NOTE — Progress Notes (Signed)
Please place orders for PAT appointment scheduled 03/25/22.

## 2022-03-25 ENCOUNTER — Encounter (HOSPITAL_COMMUNITY): Payer: Self-pay

## 2022-03-25 ENCOUNTER — Other Ambulatory Visit: Payer: Self-pay | Admitting: Physician Assistant

## 2022-03-25 ENCOUNTER — Encounter (HOSPITAL_COMMUNITY)
Admission: RE | Admit: 2022-03-25 | Discharge: 2022-03-25 | Disposition: A | Discharge status: Home / Self Care | Payer: Medicare Other | Source: Ambulatory Visit | Attending: Orthopaedic Surgery | Admitting: Orthopaedic Surgery

## 2022-03-25 VITALS — BP 175/85 | HR 62 | Temp 98.8°F | Resp 14 | Ht 68.0 in | Wt 255.0 lb

## 2022-03-25 DIAGNOSIS — E119 Type 2 diabetes mellitus without complications: Secondary | ICD-10-CM | POA: Diagnosis not present

## 2022-03-25 DIAGNOSIS — I2510 Atherosclerotic heart disease of native coronary artery without angina pectoris: Secondary | ICD-10-CM | POA: Diagnosis not present

## 2022-03-25 DIAGNOSIS — Z01818 Encounter for other preprocedural examination: Secondary | ICD-10-CM | POA: Diagnosis present

## 2022-03-25 DIAGNOSIS — M1711 Unilateral primary osteoarthritis, right knee: Secondary | ICD-10-CM | POA: Diagnosis not present

## 2022-03-25 DIAGNOSIS — Z87891 Personal history of nicotine dependence: Secondary | ICD-10-CM | POA: Diagnosis not present

## 2022-03-25 DIAGNOSIS — G4730 Sleep apnea, unspecified: Secondary | ICD-10-CM | POA: Insufficient documentation

## 2022-03-25 DIAGNOSIS — I10 Essential (primary) hypertension: Secondary | ICD-10-CM | POA: Diagnosis not present

## 2022-03-25 HISTORY — DX: Hypothyroidism, unspecified: E03.9

## 2022-03-25 HISTORY — DX: Atherosclerotic heart disease of native coronary artery without angina pectoris: I25.10

## 2022-03-25 HISTORY — DX: Anemia, unspecified: D64.9

## 2022-03-25 LAB — CBC
HCT: 41.5 % (ref 39.0–52.0)
Hemoglobin: 13.6 g/dL (ref 13.0–17.0)
MCH: 27.4 pg (ref 26.0–34.0)
MCHC: 32.8 g/dL (ref 30.0–36.0)
MCV: 83.7 fL (ref 80.0–100.0)
Platelets: 260 10*3/uL (ref 150–400)
RBC: 4.96 MIL/uL (ref 4.22–5.81)
RDW: 18.9 % — ABNORMAL HIGH (ref 11.5–15.5)
WBC: 5.2 10*3/uL (ref 4.0–10.5)
nRBC: 0 % (ref 0.0–0.2)

## 2022-03-25 LAB — COMPREHENSIVE METABOLIC PANEL
ALT: 36 U/L (ref 0–44)
AST: 30 U/L (ref 15–41)
Albumin: 4.2 g/dL (ref 3.5–5.0)
Alkaline Phosphatase: 55 U/L (ref 38–126)
Anion gap: 10 (ref 5–15)
BUN: 12 mg/dL (ref 8–23)
CO2: 24 mmol/L (ref 22–32)
Calcium: 9.2 mg/dL (ref 8.9–10.3)
Chloride: 101 mmol/L (ref 98–111)
Creatinine, Ser: 1.04 mg/dL (ref 0.61–1.24)
GFR, Estimated: >60 mL/min (ref >60)
Glucose, Bld: 232 mg/dL — ABNORMAL HIGH (ref 70–99)
Potassium: 4.1 mmol/L (ref 3.5–5.1)
Sodium: 135 mmol/L (ref 135–145)
Total Bilirubin: 0.6 mg/dL (ref 0.3–1.2)
Total Protein: 7.8 g/dL (ref 6.5–8.1)

## 2022-03-25 LAB — SURGICAL PCR SCREEN
MRSA, PCR: NEGATIVE
Staphylococcus aureus: NEGATIVE

## 2022-03-25 LAB — FERRITIN: Ferritin: 22 ng/mL — ABNORMAL LOW (ref 24–336)

## 2022-03-25 LAB — IRON AND TIBC
Iron: 71 ug/dL (ref 45–182)
Saturation Ratios: 15 % — ABNORMAL LOW (ref 17.9–39.5)
TIBC: 473 ug/dL — ABNORMAL HIGH (ref 250–450)
UIBC: 402 ug/dL

## 2022-03-25 LAB — HEMOGLOBIN A1C
Hgb A1c MFr Bld: 5.8 % — ABNORMAL HIGH (ref 4.8–5.6)
Mean Plasma Glucose: 119.76 mg/dL

## 2022-03-25 LAB — GLUCOSE, CAPILLARY: Glucose-Capillary: 239 mg/dL — ABNORMAL HIGH (ref 70–99)

## 2022-03-25 LAB — TSH: TSH: 3.381 u[IU]/mL (ref 0.350–4.500)

## 2022-03-26 ENCOUNTER — Encounter: Payer: Medicare Other | Admitting: Orthopaedic Surgery

## 2022-03-26 NOTE — Progress Notes (Signed)
Anesthesia Chart Review   Case: [id number] Date/Time: 04/03/22 1135   Procedure: RIGHT KNEE ARTHROPLASTY (Right: Knee)   Anesthesia type: Choice   Pre-op diagnosis: OSTEOARTHRITIS / DEGENERATIVE JOINT DISEASE RIGHT KNEE   Location: [facility] ROOM 10 / [facility]   Surgeons: Kathryne Hitch, MD       DISCUSSION:66 y.o. former smoker with h/o PONV, HTN, sleep apnea, CAD, DM II, right knee OA scheduled for above procedure 04/03/2022 with Dr. Doneen Poisson.   Pt seen by cardiology 01/13/2022, "Preoperative cardiac evaluation: He is doing well and may proceed to surgery without further cardiac testing.  According to the Revised Cardiac Risk Index (RCRI), his Perioperative Risk of Major Cardiac Event is (%): 0.9. His Functional Capacity in METs is: 4.64 according to the Duke Activity Status Index (DASI).  He may hold aspirin for surgery in the setting of no recent coronary intervention and resume as soon as hemodynamically stable."  Anticipate pt can proceed with planned procedure barring acute status change.   VS: BP (!) 175/85   Pulse 62   Temp 37.1 C (Oral)   Resp 14   Ht 5\' 8"  (1.727 m)   Wt 115.7 kg   SpO2 98%   BMI 38.77 kg/m   PROVIDERS: , MD is PCP   Cardiologist - [name], MD LABS: Labs reviewed: Acceptable for surgery. (all labs ordered are listed, but only abnormal results are displayed)  Labs Reviewed  HEMOGLOBIN A1C - Abnormal; Notable for the following components:      Result Value   Hgb A1c MFr Bld 5.8 (*)    All other components within normal limits  GLUCOSE, CAPILLARY - Abnormal; Notable for the following components:   Glucose-Capillary 239 (*)    All other components within normal limits  CBC - Abnormal; Notable for the following components:   RDW 18.9 (*)    All other components within normal limits  COMPREHENSIVE METABOLIC PANEL - Abnormal; Notable for the following components:   Glucose, Bld 232 (*)    All other components  within normal limits  IRON AND TIBC - Abnormal; Notable for the following components:   TIBC 473 (*)    Saturation Ratios 15 (*)    All other components within normal limits  FERRITIN - Abnormal; Notable for the following components:   Ferritin 22 (*)    All other components within normal limits  SURGICAL PCR SCREEN  TSH  TYPE AND SCREEN     IMAGES:   EKG:   CV: Coronary CTA 11/28/21   1. Coronary calcium score of 335. This was 76th percentile for age-, sex, and race-matched controls.   2. Normal coronary origin with left dominance.   3. Mild calcified plaque (25-49%) in the LAD/LCX.   RECOMMENDATIONS: 1. Mild non-obstructive CAD (25-49%). Consider non-atherosclerotic causes of chest pain. Consider preventive therapy and risk factor modification.  Echo 11/28/21  1. Left ventricular ejection fraction, by estimation, is 70 to 75%. The  left ventricle has hyperdynamic function. The left ventricle has no  regional wall motion abnormalities. There is mild left ventricular  hypertrophy. Left ventricular diastolic  parameters are consistent with Grade I diastolic dysfunction (impaired  relaxation).   2. Right ventricular systolic function is normal. The right ventricular  size is normal. There is normal pulmonary artery systolic pressure. The  estimated right ventricular systolic pressure is 33.5 mmHg.   3. Left atrial size was mildly dilated.   4. The mitral valve is grossly normal. Trivial  mitral valve  regurgitation.   5. The aortic valve is tricuspid. Aortic valve regurgitation is not  visualized.   6. Aortic dilatation noted. There is borderline dilatation of the aortic  root, measuring 38 mm.   7. The inferior vena cava is normal in size with greater than 50%  respiratory variability, suggesting right atrial pressure of 3 mmHg.  Past Medical History:  Diagnosis Date   Anemia    Anxiety    Arthritis    Chest pressure    cardiac cath; minimal non-obs CAD and  normal EF 06/2008   Coronary artery disease    Depression    Diabetes mellitus    x5 years  type 2   Family history of anesthesia complication    mother n/v   GERD (gastroesophageal reflux disease)    Glaucoma    no per pt   Headache(784.0)     headaches   Cluster   Heart murmur    slight per pcp that is retired [name]  no issues    History of peptic ulcer disease    Hyperlipidemia    Hypertension    s/p syncopal episode with facial trauma due to orthostasis 10/2008. diuretic held   Hypothyroidism    Obesity    PONV (postoperative nausea and vomiting)    once   Sleep apnea    dont use CPAP   Syncopal episodes    likely due to orthostatic hypotension    Past Surgical History:  Procedure Laterality Date   left knee surgery     medial meniscus repair x2   MOUTH SURGERY     to make palate wider   NASAL HEMORRHAGE CONTROL N/A 03/24/2013   Procedure: EPISTAXIS CONTROL;  Surgeon: [name], MD;  Location: [facility] OR;  Service: ENT;  Laterality: N/A;   NASAL SEPTOPLASTY W/ TURBINOPLASTY N/A 03/24/2013   Procedure: NASAL SEPTOPLASTY WITH TURBINATE REDUCTION;  Surgeon: [name], MD;  Location: [facility] OR;  Service: ENT;  Laterality: N/A;   NECK SURGERY     C5-C6   VASECTOMY      MEDICATIONS:  SitaGLIPtin-MetFORMIN HCl (JANUMET XR) 50-1000 MG TB24   ALPRAZolam (XANAX) 0.5 MG tablet   amLODipine (NORVASC) 5 MG tablet   amLODipine (NORVASC) 5 MG tablet   Ascorbic Acid (VITAMIN C PO)   aspirin 81 MG chewable tablet   carvedilol (COREG) 25 MG tablet   fenofibrate 160 MG tablet   ferrous sulfate 325 (65 FE) MG EC tablet   FLUoxetine (PROZAC) 40 MG capsule   gabapentin (NEURONTIN) 100 MG capsule   glimepiride (AMARYL) 4 MG tablet   hydrochlorothiazide (HYDRODIURIL) 25 MG tablet   ibuprofen (ADVIL) 200 MG tablet   insulin lispro (HUMALOG KWIKPEN) 100 UNIT/ML KwikPen   LANTUS SOLOSTAR 100 UNIT/ML Solostar Pen   levothyroxine (SYNTHROID) 50 MCG tablet   lisinopril  (ZESTRIL) 40 MG tablet   ONETOUCH VERIO test strip   ONETOUCH VERIO test strip   pantoprazole (PROTONIX) 40 MG tablet   rosuvastatin (CRESTOR) 5 MG tablet   SitaGLIPtin-MetFORMIN HCl (JANUMET XR) 50-1000 MG TB24   Sodium Sulfate-Mag Sulfate-KCl (SUTAB) [phone] MG TABS   UNIFINE PENTIPS 31G X 8 MM MISC   UNIFINE PENTIPS 31G X 8 MM MISC   No current facility-administered medications for this encounter.   Jodell Cipro Ward, PA-C [facility]-Surgical Testing [phone]

## 2022-03-28 ENCOUNTER — Other Ambulatory Visit (HOSPITAL_COMMUNITY): Payer: Self-pay

## 2022-03-30 ENCOUNTER — Other Ambulatory Visit (HOSPITAL_COMMUNITY): Payer: Self-pay

## 2022-03-30 MED ORDER — ALPRAZOLAM 0.5 MG PO TABS
ORAL_TABLET | ORAL | 0 refills | Status: DC
  Filled 2022-03-30: qty 30, 10d supply, fill #0

## 2022-03-31 ENCOUNTER — Other Ambulatory Visit (HOSPITAL_COMMUNITY): Payer: Self-pay

## 2022-04-01 ENCOUNTER — Other Ambulatory Visit (HOSPITAL_COMMUNITY): Payer: Self-pay

## 2022-04-01 MED ORDER — LEVOTHYROXINE SODIUM 50 MCG PO TABS
ORAL_TABLET | ORAL | 5 refills | Status: DC
  Filled 2022-06-04: qty 30, 30d supply, fill #0
  Filled 2022-07-09: qty 30, 30d supply, fill #1
  Filled 2022-08-14: qty 30, 30d supply, fill #2
  Filled 2022-10-07: qty 30, 30d supply, fill #3
  Filled 2022-11-06: qty 30, 30d supply, fill #4
  Filled 2022-12-12: qty 30, 30d supply, fill #5

## 2022-04-02 NOTE — H&P (Signed)
TOTAL KNEE ADMISSION H&P  Patient is being admitted for right total knee arthroplasty.  Subjective:  Chief Complaint:right knee pain.  HPI: Tony Morrison, 66 y.o. male, has a history of pain and functional disability in the right knee due to arthritis and has failed non-surgical conservative treatments for greater than 12 weeks to includeNSAID's and/or analgesics, corticosteriod injections, viscosupplementation injections, flexibility and strengthening excercises, weight reduction as appropriate, and activity modification.  Onset of symptoms was gradual, starting 5 years ago with gradually worsening course since that time. The patient noted no past surgery on the right knee(s).  Patient currently rates pain in the right knee(s) at 10 out of 10 with activity. Patient has night pain, worsening of pain with activity and weight bearing, pain that interferes with activities of daily living, pain with passive range of motion, crepitus, and joint swelling.  Patient has evidence of sever arthritis by imaging studies. There is no active infection.  Patient Active Problem List   Diagnosis Date Noted   Unilateral primary osteoarthritis, right knee 11/23/2019   OBESITY-MORBID (>100') 07/30/2009   SYNCOPE AND COLLAPSE 10/29/2008   OBSTRUCTIVE SLEEP APNEA 09/28/2008   HYPERLIPIDEMIA-MIXED 07/12/2008   HYPERTENSION, BENIGN 07/12/2008   CHEST PAIN-UNSPECIFIED 07/12/2008   Past Medical History:  Diagnosis Date   Anemia    Anxiety    Arthritis    Chest pressure    cardiac cath; minimal non-obs CAD and normal EF 06/2008   Coronary artery disease    Depression    Diabetes mellitus    x5 years  type 2   Family history of anesthesia complication    mother n/v   GERD (gastroesophageal reflux disease)    Glaucoma    no per pt   Headache(784.0)     headaches   Cluster   Heart murmur    slight per pcp that is retired [name]  no issues    History of peptic ulcer disease    Hyperlipidemia     Hypertension    s/p syncopal episode with facial trauma due to orthostasis 10/2008. diuretic held   Hypothyroidism    Obesity    PONV (postoperative nausea and vomiting)    once   Sleep apnea    dont use CPAP   Syncopal episodes    likely due to orthostatic hypotension    Past Surgical History:  Procedure Laterality Date   left knee surgery     medial meniscus repair x2   MOUTH SURGERY     to make palate wider   NASAL HEMORRHAGE CONTROL N/A 03/24/2013   Procedure: EPISTAXIS CONTROL;  Surgeon: [name], MD;  Location: [facility] OR;  Service: ENT;  Laterality: N/A;   NASAL SEPTOPLASTY W/ TURBINOPLASTY N/A 03/24/2013   Procedure: NASAL SEPTOPLASTY WITH TURBINATE REDUCTION;  Surgeon: [name], MD;  Location: [facility] OR;  Service: ENT;  Laterality: N/A;   NECK SURGERY     C5-C6   VASECTOMY      No current facility-administered medications for this encounter.   Current Outpatient Medications  Medication Sig Dispense Refill Last Dose   ALPRAZolam (XANAX) 0.5 MG tablet Take one tablet (0.5 mg dose) by mouth 3 (three) times a day as needed for Sleep or Anxiety. 30 tablet 0    amLODipine (NORVASC) 5 MG tablet Take 1 tablet by mouth daily. 30 tablet 3    Ascorbic Acid (VITAMIN C PO) Take 1 tablet by mouth daily.      aspirin 81 MG chewable tablet Chew by  mouth daily.      carvedilol (COREG) 25 MG tablet Take 1 tablet (25 mg dose) by mouth 2 (two) times daily with a meal. 180 tablet 3    fenofibrate 160 MG tablet Take 1 tablet by mouth daily. 30 tablet 5    ferrous sulfate 325 (65 FE) MG EC tablet Take 1 tablet by mouth 2 times daily. (Patient taking differently: Take 650 mg by mouth daily.) 60 tablet 3    FLUoxetine (PROZAC) 40 MG capsule Take 1 capsule by mouth daily. 90 capsule 3    gabapentin (NEURONTIN) 100 MG capsule Take 1 capsule (100 mg total) by mouth 3 (three) times daily. (Patient taking differently: Take 100 mg by mouth 3 (three) times daily as needed (pain).) 90 capsule 1     glimepiride (AMARYL) 4 MG tablet Take 1 tablet by mouth 2 times daily. 60 tablet 3    hydrochlorothiazide (HYDRODIURIL) 25 MG tablet Take one half tablet (12.5 mg dose) by mouth daily. 90 tablet 3    ibuprofen (ADVIL) 200 MG tablet Take 800 mg by mouth every 6 (six) hours as needed for moderate pain.      insulin lispro (HUMALOG KWIKPEN) 100 UNIT/ML KwikPen Inject 10 Units into the skin 3 times daily with meals. (Patient taking differently: Inject 30 Units into the skin daily.) 15 mL 3    LANTUS SOLOSTAR 100 UNIT/ML Solostar Pen INJECT 70 UNITS INTO THE SKIN EVERY MORNING 18 mL 11    levothyroxine (SYNTHROID) 50 MCG tablet Take 1 tablet by mouth daily. 30 tablet 5    lisinopril (ZESTRIL) 40 MG tablet TAKE 1 TABLET BY MOUTH ONCE A DAY 90 tablet 11    pantoprazole (PROTONIX) 40 MG tablet Take 1 tablet (40 mg dose) by mouth daily. 90 tablet 3    rosuvastatin (CRESTOR) 5 MG tablet Take 1 tablet by mouth daily. 30 tablet 3    SitaGLIPtin-MetFORMIN HCl (JANUMET XR) 50-1000 MG TB24 Take one tablet by mouth daily. 30 tablet 3    ALPRAZolam (XANAX) 0.5 MG tablet Take one tablet (0.5 mg dose) by mouth 3 (three) times a day as needed for Sleep or Anxiety. 30 tablet 0    amLODipine (NORVASC) 5 MG tablet Take 1 tablet by mouth daily. (Patient not taking: Reported on 03/20/2022) 30 tablet 3 Not Taking   levothyroxine (SYNTHROID) 50 MCG tablet Take one tablet (50 mcg dose) by mouth daily. 30 tablet 5    ONETOUCH VERIO test strip Use to test blood sugar 3 times a day 100 each 2    ONETOUCH VERIO test strip Use to check blood sugar 3 times a day. (Patient not taking: Reported on 01/13/2022) 100 each 2    SitaGLIPtin-MetFORMIN HCl (JANUMET XR) 50-1000 MG TB24 Take 1 tablet by mouth daily. 30 tablet 3 Not Taking   Sodium Sulfate-Mag Sulfate-KCl (SUTAB) [id number] MG TABS Use as directed (Patient not taking: Reported on 03/20/2022) 24 tablet 0 Completed Course   UNIFINE PENTIPS 31G X 8 MM MISC USE AS DIRECTED 100 each 1     UNIFINE PENTIPS 31G X 8 MM MISC USE AS DIRECTED 100 each 1    Allergies  Allergen Reactions   Codeine     GI uspet   Prednisone     Makes him "ill"    Social History   Tobacco Use   Smoking status: Former    Packs/day: 1.00    Years: 10.00    Total pack years: 10.00    Types: Cigarettes  Quit date: 08/10/1982    Years since quitting: 39.6   Smokeless tobacco: Never  Substance Use Topics   Alcohol use: No    Family History  Problem Relation Age of Onset   Heart disease Mother 31       CABG   Cancer Father        lung   Heart disease Father 29       CABG     Review of Systems  Musculoskeletal:  Positive for gait problem and joint swelling.  All other systems reviewed and are negative.   Objective:  Physical Exam Vitals reviewed.  Constitutional:      Appearance: Normal appearance.  HENT:     Head: Normocephalic and atraumatic.  Cardiovascular:     Rate and Rhythm: Normal rate and regular rhythm.  Pulmonary:     Effort: Pulmonary effort is normal.     Breath sounds: Normal breath sounds.  Abdominal:     Palpations: Abdomen is soft.  Musculoskeletal:     Cervical back: Normal range of motion and neck supple.     Right knee: Effusion, bony tenderness and crepitus present. Decreased range of motion. Tenderness present over the medial joint line and lateral joint line. Abnormal alignment and abnormal meniscus.  Neurological:     Mental Status: He is alert and oriented to person, place, and time.  Psychiatric:        Behavior: Behavior normal.     Vital signs in last 24 hours:    Labs:   Estimated body mass index is 38.77 kg/m as calculated from the following:   Height as of 03/25/22: 5\' 8"  (1.727 m).   Weight as of 03/25/22: 115.7 kg.   Imaging Review Plain radiographs demonstrate severe degenerative joint disease of the right knee(s). The overall alignment ismild varus. The bone quality appears to be excellent for age and reported activity  level.      Assessment/Plan:  End stage arthritis, right knee   The patient history, physical examination, clinical judgment of the provider and imaging studies are consistent with end stage degenerative joint disease of the right knee(s) and total knee arthroplasty is deemed medically necessary. The treatment options including medical management, injection therapy arthroscopy and arthroplasty were discussed at length. The risks and benefits of total knee arthroplasty were presented and reviewed. The risks due to aseptic loosening, infection, stiffness, patella tracking problems, thromboembolic complications and other imponderables were discussed. The patient acknowledged the explanation, agreed to proceed with the plan and consent was signed. Patient is being admitted for inpatient treatment for surgery, pain control, PT, OT, prophylactic antibiotics, VTE prophylaxis, progressive ambulation and ADL's and discharge planning. The patient is planning to be discharged home with home health services

## 2022-04-03 ENCOUNTER — Ambulatory Visit (HOSPITAL_COMMUNITY): Payer: Medicare Other | Admitting: Physician Assistant

## 2022-04-03 ENCOUNTER — Other Ambulatory Visit (HOSPITAL_COMMUNITY): Payer: Self-pay

## 2022-04-03 ENCOUNTER — Ambulatory Visit (HOSPITAL_BASED_OUTPATIENT_CLINIC_OR_DEPARTMENT_OTHER): Payer: Medicare Other | Admitting: Certified Registered"

## 2022-04-03 ENCOUNTER — Encounter (HOSPITAL_COMMUNITY)
Admission: RE | Disposition: A | Discharge status: Home Health | Payer: Self-pay | Source: Home / Self Care | Attending: Orthopaedic Surgery

## 2022-04-03 ENCOUNTER — Encounter (HOSPITAL_COMMUNITY): Payer: Self-pay | Admitting: Orthopaedic Surgery

## 2022-04-03 ENCOUNTER — Other Ambulatory Visit: Payer: Self-pay

## 2022-04-03 ENCOUNTER — Observation Stay (HOSPITAL_COMMUNITY)
Admission: RE | Admit: 2022-04-03 | Discharge: 2022-04-04 | Disposition: A | Discharge status: Home Health | Payer: Medicare Other | Attending: Orthopaedic Surgery | Admitting: Orthopaedic Surgery

## 2022-04-03 ENCOUNTER — Observation Stay (HOSPITAL_COMMUNITY): Payer: Medicare Other

## 2022-04-03 DIAGNOSIS — Z79899 Other long term (current) drug therapy: Secondary | ICD-10-CM | POA: Diagnosis not present

## 2022-04-03 DIAGNOSIS — Z7984 Long term (current) use of oral hypoglycemic drugs: Secondary | ICD-10-CM

## 2022-04-03 DIAGNOSIS — M1711 Unilateral primary osteoarthritis, right knee: Principal | ICD-10-CM | POA: Insufficient documentation

## 2022-04-03 DIAGNOSIS — Z794 Long term (current) use of insulin: Secondary | ICD-10-CM | POA: Insufficient documentation

## 2022-04-03 DIAGNOSIS — E039 Hypothyroidism, unspecified: Secondary | ICD-10-CM | POA: Insufficient documentation

## 2022-04-03 DIAGNOSIS — Z7982 Long term (current) use of aspirin: Secondary | ICD-10-CM | POA: Diagnosis not present

## 2022-04-03 DIAGNOSIS — E119 Type 2 diabetes mellitus without complications: Secondary | ICD-10-CM | POA: Diagnosis not present

## 2022-04-03 DIAGNOSIS — I2510 Atherosclerotic heart disease of native coronary artery without angina pectoris: Secondary | ICD-10-CM

## 2022-04-03 DIAGNOSIS — Z96651 Presence of right artificial knee joint: Secondary | ICD-10-CM

## 2022-04-03 DIAGNOSIS — Z87891 Personal history of nicotine dependence: Secondary | ICD-10-CM | POA: Diagnosis not present

## 2022-04-03 DIAGNOSIS — I10 Essential (primary) hypertension: Secondary | ICD-10-CM | POA: Insufficient documentation

## 2022-04-03 HISTORY — PX: TOTAL KNEE ARTHROPLASTY: SHX125

## 2022-04-03 LAB — TYPE AND SCREEN
ABO/RH(D): A NEG
Antibody Screen: NEGATIVE

## 2022-04-03 LAB — GLUCOSE, CAPILLARY
Glucose-Capillary: 155 mg/dL — ABNORMAL HIGH (ref 70–99)
Glucose-Capillary: 144 mg/dL — ABNORMAL HIGH (ref 70–99)
Glucose-Capillary: 378 mg/dL — ABNORMAL HIGH (ref 70–99)

## 2022-04-03 SURGERY — ARTHROPLASTY, KNEE, TOTAL
Anesthesia: Spinal | Site: Knee | Laterality: Right

## 2022-04-03 MED ORDER — METHOCARBAMOL 500 MG PO TABS
500.0000 mg | ORAL_TABLET | Freq: Four times a day (QID) | ORAL | Status: DC | PRN
  Administered 2022-04-03 – 2022-04-04 (×4): 500 mg via ORAL
  Filled 2022-04-03 (×4): qty 1

## 2022-04-03 MED ORDER — ONDANSETRON HCL 4 MG/2ML IJ SOLN: 4.0000 mg | Freq: Four times a day (QID) | INTRAMUSCULAR | Status: DC | PRN

## 2022-04-03 MED ORDER — FLUOXETINE HCL 20 MG PO CAPS
40.0000 mg | ORAL_CAPSULE | Freq: Every day | ORAL | Status: DC
  Administered 2022-04-04: 40 mg via ORAL
  Filled 2022-04-03: qty 2

## 2022-04-03 MED ORDER — CEFAZOLIN SODIUM-DEXTROSE 2-4 GM/100ML-% IV SOLN
2.0000 g | INTRAVENOUS | Status: AC
  Administered 2022-04-03: 2 g via INTRAVENOUS
  Filled 2022-04-03: qty 100

## 2022-04-03 MED ORDER — LACTATED RINGERS IV SOLN: INTRAVENOUS | Status: DC

## 2022-04-03 MED ORDER — DOCUSATE SODIUM 100 MG PO CAPS
100.0000 mg | ORAL_CAPSULE | Freq: Two times a day (BID) | ORAL | Status: DC
  Administered 2022-04-03 – 2022-04-04 (×2): 100 mg via ORAL
  Filled 2022-04-03 (×2): qty 1

## 2022-04-03 MED ORDER — OXYCODONE HCL 5 MG PO TABS
5.0000 mg | ORAL_TABLET | ORAL | Status: DC | PRN
  Administered 2022-04-03 (×2): 10 mg via ORAL
  Administered 2022-04-04: 5 mg via ORAL
  Administered 2022-04-04: 10 mg via ORAL
  Administered 2022-04-04: 5 mg via ORAL
  Filled 2022-04-03 (×3): qty 2
  Filled 2022-04-03 (×2): qty 1

## 2022-04-03 MED ORDER — ORAL CARE MOUTH RINSE: 15.0000 mL | Freq: Once | OROMUCOSAL | Status: AC

## 2022-04-03 MED ORDER — CARVEDILOL 25 MG PO TABS
25.0000 mg | ORAL_TABLET | Freq: Two times a day (BID) | ORAL | Status: DC
  Administered 2022-04-03 – 2022-04-04 (×2): 25 mg via ORAL
  Filled 2022-04-03 (×2): qty 1

## 2022-04-03 MED ORDER — LINAGLIPTIN 5 MG PO TABS
5.0000 mg | ORAL_TABLET | Freq: Every day | ORAL | Status: DC
  Administered 2022-04-04: 5 mg via ORAL
  Filled 2022-04-03: qty 1

## 2022-04-03 MED ORDER — METHOCARBAMOL 500 MG IVPB - SIMPLE MED: 500.0000 mg | Freq: Four times a day (QID) | INTRAVENOUS | Status: DC | PRN

## 2022-04-03 MED ORDER — GABAPENTIN 100 MG PO CAPS
100.0000 mg | ORAL_CAPSULE | Freq: Three times a day (TID) | ORAL | Status: DC | PRN
  Filled 2022-04-03: qty 1

## 2022-04-03 MED ORDER — FENTANYL CITRATE PF 50 MCG/ML IJ SOSY
PREFILLED_SYRINGE | INTRAMUSCULAR | Status: AC
  Filled 2022-04-03: qty 1

## 2022-04-03 MED ORDER — EPHEDRINE 5 MG/ML INJ
INTRAVENOUS | Status: AC
  Filled 2022-04-03: qty 5

## 2022-04-03 MED ORDER — CLONIDINE HCL (ANALGESIA) 100 MCG/ML EP SOLN
EPIDURAL | Status: DC | PRN
  Administered 2022-04-03: 50 ug

## 2022-04-03 MED ORDER — PROPOFOL 1000 MG/100ML IV EMUL
INTRAVENOUS | Status: AC
  Filled 2022-04-03: qty 100

## 2022-04-03 MED ORDER — HYDROMORPHONE HCL 2 MG PO TABS: 2.0000 mg | ORAL_TABLET | ORAL | Status: DC | PRN

## 2022-04-03 MED ORDER — SITAGLIP PHOS-METFORMIN HCL ER 50-1000 MG PO TB24: 1.0000 | ORAL_TABLET | Freq: Every day | ORAL | Status: DC

## 2022-04-03 MED ORDER — PROPOFOL 10 MG/ML IV BOLUS
INTRAVENOUS | Status: DC | PRN
  Administered 2022-04-03: 30 mg via INTRAVENOUS

## 2022-04-03 MED ORDER — ONDANSETRON HCL 4 MG/2ML IJ SOLN
INTRAMUSCULAR | Status: DC | PRN
  Administered 2022-04-03: 4 mg via INTRAVENOUS

## 2022-04-03 MED ORDER — FENTANYL CITRATE PF 50 MCG/ML IJ SOSY
25.0000 ug | PREFILLED_SYRINGE | INTRAMUSCULAR | Status: DC | PRN
  Administered 2022-04-03: 50 ug via INTRAVENOUS

## 2022-04-03 MED ORDER — METHOCARBAMOL 500 MG IVPB - SIMPLE MED
INTRAVENOUS | Status: AC
  Filled 2022-04-03: qty 55

## 2022-04-03 MED ORDER — AMISULPRIDE (ANTIEMETIC) 5 MG/2ML IV SOLN: 10.0000 mg | Freq: Once | INTRAVENOUS | Status: DC | PRN

## 2022-04-03 MED ORDER — PROPOFOL 500 MG/50ML IV EMUL
INTRAVENOUS | Status: AC
  Filled 2022-04-03: qty 50

## 2022-04-03 MED ORDER — ACETAMINOPHEN 500 MG PO TABS: 1000.0000 mg | ORAL_TABLET | Freq: Once | ORAL | Status: DC

## 2022-04-03 MED ORDER — AMLODIPINE BESYLATE 5 MG PO TABS
5.0000 mg | ORAL_TABLET | Freq: Every day | ORAL | Status: DC
  Administered 2022-04-04: 5 mg via ORAL
  Filled 2022-04-03: qty 1

## 2022-04-03 MED ORDER — 0.9 % SODIUM CHLORIDE (POUR BTL) OPTIME
TOPICAL | Status: DC | PRN
  Administered 2022-04-03: 1000 mL

## 2022-04-03 MED ORDER — ASPIRIN 81 MG PO CHEW
81.0000 mg | CHEWABLE_TABLET | Freq: Two times a day (BID) | ORAL | Status: DC
  Administered 2022-04-03 – 2022-04-04 (×2): 81 mg via ORAL
  Filled 2022-04-03 (×2): qty 1

## 2022-04-03 MED ORDER — PHENOL 1.4 % MT LIQD: 1.0000 | OROMUCOSAL | Status: DC | PRN

## 2022-04-03 MED ORDER — FERROUS SULFATE 325 (65 FE) MG PO TABS
650.0000 mg | ORAL_TABLET | Freq: Every day | ORAL | Status: DC
  Administered 2022-04-04: 650 mg via ORAL
  Filled 2022-04-03: qty 2

## 2022-04-03 MED ORDER — METOCLOPRAMIDE HCL 5 MG/ML IJ SOLN: 5.0000 mg | Freq: Three times a day (TID) | INTRAMUSCULAR | Status: DC | PRN

## 2022-04-03 MED ORDER — METOCLOPRAMIDE HCL 5 MG PO TABS: 5.0000 mg | ORAL_TABLET | Freq: Three times a day (TID) | ORAL | Status: DC | PRN

## 2022-04-03 MED ORDER — BUPIVACAINE-EPINEPHRINE (PF) 0.5% -1:200000 IJ SOLN
INTRAMUSCULAR | Status: AC
  Filled 2022-04-03: qty 30

## 2022-04-03 MED ORDER — DEXAMETHASONE SODIUM PHOSPHATE 10 MG/ML IJ SOLN
INTRAMUSCULAR | Status: DC | PRN
  Administered 2022-04-03: 8 mg via INTRAVENOUS

## 2022-04-03 MED ORDER — DIPHENHYDRAMINE HCL 12.5 MG/5ML PO ELIX: 12.5000 mg | ORAL_SOLUTION | ORAL | Status: DC | PRN

## 2022-04-03 MED ORDER — INSULIN GLARGINE-YFGN 100 UNIT/ML ~~LOC~~ SOLN
35.0000 [IU] | Freq: Every day | SUBCUTANEOUS | Status: DC
  Administered 2022-04-03 – 2022-04-04 (×2): 35 [IU] via SUBCUTANEOUS
  Filled 2022-04-03 (×2): qty 0.35

## 2022-04-03 MED ORDER — ALPRAZOLAM 0.5 MG PO TABS
0.5000 mg | ORAL_TABLET | Freq: Three times a day (TID) | ORAL | Status: DC | PRN
  Administered 2022-04-03: 0.5 mg via ORAL
  Filled 2022-04-03: qty 1

## 2022-04-03 MED ORDER — MIDAZOLAM HCL 2 MG/2ML IJ SOLN
1.0000 mg | INTRAMUSCULAR | Status: DC
  Administered 2022-04-03: 1 mg via INTRAVENOUS
  Filled 2022-04-03: qty 2

## 2022-04-03 MED ORDER — CEFAZOLIN SODIUM-DEXTROSE 1-4 GM/50ML-% IV SOLN
1.0000 g | Freq: Four times a day (QID) | INTRAVENOUS | Status: AC
  Administered 2022-04-03 – 2022-04-04 (×2): 1 g via INTRAVENOUS
  Filled 2022-04-03 (×2): qty 50

## 2022-04-03 MED ORDER — CHLORHEXIDINE GLUCONATE 0.12 % MT SOLN
15.0000 mL | Freq: Once | OROMUCOSAL | Status: AC
  Administered 2022-04-03: 15 mL via OROMUCOSAL

## 2022-04-03 MED ORDER — METFORMIN HCL ER 500 MG PO TB24
1000.0000 mg | ORAL_TABLET | Freq: Every day | ORAL | Status: DC
  Administered 2022-04-04: 1000 mg via ORAL
  Filled 2022-04-03: qty 2

## 2022-04-03 MED ORDER — LEVOTHYROXINE SODIUM 50 MCG PO TABS
50.0000 ug | ORAL_TABLET | Freq: Every day | ORAL | Status: DC
  Administered 2022-04-04: 50 ug via ORAL
  Filled 2022-04-03: qty 1

## 2022-04-03 MED ORDER — FENOFIBRATE 160 MG PO TABS
160.0000 mg | ORAL_TABLET | Freq: Every day | ORAL | Status: DC
  Administered 2022-04-04: 160 mg via ORAL
  Filled 2022-04-03: qty 1

## 2022-04-03 MED ORDER — ROPIVACAINE HCL 5 MG/ML IJ SOLN
INTRAMUSCULAR | Status: DC | PRN
  Administered 2022-04-03: 20 mL via PERINEURAL

## 2022-04-03 MED ORDER — PANTOPRAZOLE SODIUM 40 MG PO TBEC
40.0000 mg | DELAYED_RELEASE_TABLET | Freq: Every day | ORAL | Status: DC
  Administered 2022-04-04: 40 mg via ORAL
  Filled 2022-04-03: qty 1

## 2022-04-03 MED ORDER — FENTANYL CITRATE PF 50 MCG/ML IJ SOSY
50.0000 ug | PREFILLED_SYRINGE | INTRAMUSCULAR | Status: DC
  Administered 2022-04-03: 50 ug via INTRAVENOUS
  Filled 2022-04-03: qty 2

## 2022-04-03 MED ORDER — TRANEXAMIC ACID-NACL 1000-0.7 MG/100ML-% IV SOLN
1000.0000 mg | INTRAVENOUS | Status: AC
  Administered 2022-04-03: 1000 mg via INTRAVENOUS
  Filled 2022-04-03: qty 100

## 2022-04-03 MED ORDER — ROSUVASTATIN CALCIUM 5 MG PO TABS
5.0000 mg | ORAL_TABLET | Freq: Every day | ORAL | Status: DC
  Administered 2022-04-04: 5 mg via ORAL
  Filled 2022-04-03: qty 1

## 2022-04-03 MED ORDER — PROPOFOL 10 MG/ML IV BOLUS
INTRAVENOUS | Status: AC
  Filled 2022-04-03: qty 20

## 2022-04-03 MED ORDER — SODIUM CHLORIDE 0.9 % IV SOLN: INTRAVENOUS | Status: DC

## 2022-04-03 MED ORDER — GLIMEPIRIDE 4 MG PO TABS
4.0000 mg | ORAL_TABLET | Freq: Two times a day (BID) | ORAL | Status: DC
  Administered 2022-04-04: 4 mg via ORAL
  Filled 2022-04-03 (×2): qty 1

## 2022-04-03 MED ORDER — PROPOFOL 500 MG/50ML IV EMUL
INTRAVENOUS | Status: DC | PRN
  Administered 2022-04-03: 100 ug/kg/min via INTRAVENOUS

## 2022-04-03 MED ORDER — SODIUM CHLORIDE 0.9 % IR SOLN
Status: DC | PRN
  Administered 2022-04-03: 1000 mL

## 2022-04-03 MED ORDER — BUPIVACAINE IN DEXTROSE 0.75-8.25 % IT SOLN
INTRATHECAL | Status: DC | PRN
  Administered 2022-04-03: 1.6 mL via INTRATHECAL

## 2022-04-03 MED ORDER — MENTHOL 3 MG MT LOZG: 1.0000 | LOZENGE | OROMUCOSAL | Status: DC | PRN

## 2022-04-03 MED ORDER — INSULIN LISPRO (1 UNIT DIAL) 100 UNIT/ML (KWIKPEN): 30.0000 [IU] | PEN_INJECTOR | Freq: Every day | SUBCUTANEOUS | Status: DC

## 2022-04-03 MED ORDER — HYDROCHLOROTHIAZIDE 12.5 MG PO TABS
12.5000 mg | ORAL_TABLET | Freq: Every day | ORAL | Status: DC
  Administered 2022-04-04: 12.5 mg via ORAL
  Filled 2022-04-03: qty 1

## 2022-04-03 MED ORDER — EPHEDRINE SULFATE-NACL 50-0.9 MG/10ML-% IV SOSY
PREFILLED_SYRINGE | INTRAVENOUS | Status: DC | PRN
  Administered 2022-04-03 (×2): 10 mg via INTRAVENOUS

## 2022-04-03 MED ORDER — HYDROMORPHONE HCL 1 MG/ML IJ SOLN
0.5000 mg | INTRAMUSCULAR | Status: DC | PRN
  Administered 2022-04-03: 1 mg via INTRAVENOUS
  Filled 2022-04-03: qty 1

## 2022-04-03 MED ORDER — POVIDONE-IODINE 10 % EX SWAB
2.0000 | Freq: Once | CUTANEOUS | Status: AC
  Administered 2022-04-03: 2 via TOPICAL

## 2022-04-03 MED ORDER — BUPIVACAINE-EPINEPHRINE (PF) 0.5% -1:200000 IJ SOLN
INTRAMUSCULAR | Status: DC | PRN
  Administered 2022-04-03: 30 mL

## 2022-04-03 MED ORDER — ALUM & MAG HYDROXIDE-SIMETH 200-200-20 MG/5ML PO SUSP: 30.0000 mL | ORAL | Status: DC | PRN

## 2022-04-03 MED ORDER — KETOROLAC TROMETHAMINE 15 MG/ML IJ SOLN
7.5000 mg | Freq: Four times a day (QID) | INTRAMUSCULAR | Status: AC
  Administered 2022-04-03 – 2022-04-04 (×4): 7.5 mg via INTRAVENOUS
  Filled 2022-04-03 (×4): qty 1

## 2022-04-03 MED ORDER — ONDANSETRON HCL 4 MG PO TABS: 4.0000 mg | ORAL_TABLET | Freq: Four times a day (QID) | ORAL | Status: DC | PRN

## 2022-04-03 MED ORDER — ACETAMINOPHEN 325 MG PO TABS: 325.0000 mg | ORAL_TABLET | Freq: Four times a day (QID) | ORAL | Status: DC | PRN

## 2022-04-03 SURGICAL SUPPLY — 60 items
BAG COUNTER SPONGE SURGICOUNT (BAG) IMPLANT
BAG ZIPLOCK 12X15 (MISCELLANEOUS) ×1 IMPLANT
BENZOIN TINCTURE PRP APPL 2/3 (GAUZE/BANDAGES/DRESSINGS) IMPLANT
BLADE SAG 18X100X1.27 (BLADE) ×1 IMPLANT
BLADE SURG SZ10 CARB STEEL (BLADE) ×2 IMPLANT
BNDG ELASTIC 6X5.8 VLCR STR LF (GAUZE/BANDAGES/DRESSINGS) ×2 IMPLANT
BOWL SMART MIX CTS (DISPOSABLE) IMPLANT
CEMENT BONE R 1X40 (Cement) IMPLANT
COMP FEM CMT PERSONA SZ9 RT (Joint) ×1 IMPLANT
COMP PATELLAR 35 STD 9 THK (Orthopedic Implant) IMPLANT
COMPONENT FEM CMT PRSONA SZ9RT (Joint) IMPLANT
COOLER ICEMAN CLASSIC (MISCELLANEOUS) ×1 IMPLANT
COVER SURGICAL LIGHT HANDLE (MISCELLANEOUS) ×1 IMPLANT
CUFF TOURN SGL QUICK 34 (TOURNIQUET CUFF) ×1
CUFF TRNQT CYL 34X4.125X (TOURNIQUET CUFF) ×1 IMPLANT
DRAPE INCISE IOBAN 66X45 STRL (DRAPES) ×1 IMPLANT
DRAPE U-SHAPE 47X51 STRL (DRAPES) ×1 IMPLANT
DRSG PAD ABDOMINAL 8X10 ST (GAUZE/BANDAGES/DRESSINGS) ×2 IMPLANT
DURAPREP 26ML APPLICATOR (WOUND CARE) ×1 IMPLANT
ELECT BLADE TIP CTD 4 INCH (ELECTRODE) ×1 IMPLANT
ELECT REM PT RETURN 15FT ADLT (MISCELLANEOUS) ×1 IMPLANT
GAUZE SPONGE 4X4 12PLY STRL (GAUZE/BANDAGES/DRESSINGS) ×1 IMPLANT
GAUZE SPONGE 4X4 8PLY NS (GAUZE/BANDAGES/DRESSINGS) IMPLANT
GAUZE XEROFORM 1X8 LF (GAUZE/BANDAGES/DRESSINGS) IMPLANT
GLOVE BIO SURGEON STRL SZ7.5 (GLOVE) ×1 IMPLANT
GLOVE BIOGEL PI IND STRL 8 (GLOVE) ×2 IMPLANT
GLOVE BIOGEL PI INDICATOR 8 (GLOVE) ×2
GLOVE ECLIPSE 8.0 STRL XLNG CF (GLOVE) ×1 IMPLANT
GOWN STRL REUS W/ TWL XL LVL3 (GOWN DISPOSABLE) ×2 IMPLANT
GOWN STRL REUS W/TWL XL LVL3 (GOWN DISPOSABLE) ×2
HANDPIECE INTERPULSE COAX TIP (DISPOSABLE) ×1
HDLS TROCR DRIL PIN KNEE 75 (PIN) ×1
HOLDER FOLEY CATH W/STRAP (MISCELLANEOUS) IMPLANT
IMMOBILIZER KNEE 20 (SOFTGOODS) ×1
IMMOBILIZER KNEE 20 THIGH 36 (SOFTGOODS) ×1 IMPLANT
INSERT TIBIA KNEE RIGHT 10 (Joint) IMPLANT
KIT TURNOVER KIT A (KITS) IMPLANT
NS IRRIG 1000ML POUR BTL (IV SOLUTION) ×1 IMPLANT
PACK TOTAL KNEE CUSTOM (KITS) ×1 IMPLANT
PAD COLD SHLDR WRAP-ON (PAD) ×1 IMPLANT
PADDING CAST COTTON 6X4 STRL (CAST SUPPLIES) ×2 IMPLANT
PATELLA ZIMMER 35MM (Orthopedic Implant) ×1 IMPLANT
PIN DRILL HDLS TROCAR 75 4PK (PIN) IMPLANT
PROTECTOR NERVE ULNAR (MISCELLANEOUS) ×1 IMPLANT
SCREW FEMALE HEX FIX 25X2.5 (ORTHOPEDIC DISPOSABLE SUPPLIES) IMPLANT
SET HNDPC FAN SPRY TIP SCT (DISPOSABLE) ×1 IMPLANT
SET PAD KNEE POSITIONER (MISCELLANEOUS) ×1 IMPLANT
SPIKE FLUID TRANSFER (MISCELLANEOUS) IMPLANT
STAPLER VISISTAT 35W (STAPLE) IMPLANT
STEM TIBIA 5 DEG SZ F R KNEE (Knees) IMPLANT
STRIP CLOSURE SKIN 1/2X4 (GAUZE/BANDAGES/DRESSINGS) IMPLANT
SUT MNCRL AB 4-0 PS2 18 (SUTURE) IMPLANT
SUT VIC AB 0 CT1 27 (SUTURE) ×1
SUT VIC AB 0 CT1 27XBRD ANTBC (SUTURE) ×1 IMPLANT
SUT VIC AB 1 CT1 36 (SUTURE) ×2 IMPLANT
SUT VIC AB 2-0 CT1 27 (SUTURE) ×2
SUT VIC AB 2-0 CT1 TAPERPNT 27 (SUTURE) ×2 IMPLANT
TIBIA STEM 5 DEG SZ F R KNEE (Knees) ×1 IMPLANT
TRAY FOLEY MTR SLVR 16FR STAT (SET/KITS/TRAYS/PACK) IMPLANT
WATER STERILE IRR 1000ML POUR (IV SOLUTION) ×2 IMPLANT

## 2022-04-03 NOTE — Transfer of Care (Signed)
Immediate Anesthesia Transfer of Care Note  Patient: Tony Morrison  Procedure(s) Performed: RIGHT KNEE ARTHROPLASTY (Right: Knee)  Patient Location: PACU  Anesthesia Type:MAC  Level of Consciousness: awake, alert  and oriented  Airway & Oxygen Therapy: Patient Spontanous Breathing and Patient connected to face mask oxygen  Post-op Assessment: Report given to RN and Post -op Vital signs reviewed and stable  Post vital signs: Reviewed and stable  Last Vitals:  Vitals Value Taken Time  BP 126/66   Temp    Pulse 47 04/03/22 1331  Resp 14 04/03/22 1331  SpO2 100 % 04/03/22 1331  Vitals shown include unvalidated device data.  Last Pain:  Vitals:   04/03/22 0930  TempSrc: Oral  PainSc:          Complications: No notable events documented.

## 2022-04-03 NOTE — Brief Op Note (Signed)
04/03/2022  12:52 PM  PATIENT:  Harrold Donath  66 y.o. male  PRE-OPERATIVE DIAGNOSIS:  OSTEOARTHRITIS / DEGENERATIVE JOINT DISEASE RIGHT KNEE  POST-OPERATIVE DIAGNOSIS:  OSTEOARTHRITIS / DEGENERATIVE JOINT DISEASE RIGHT KNEE  PROCEDURE:  Procedure(s): RIGHT KNEE ARTHROPLASTY (Right)  SURGEON:  Surgeon(s) and Role:    * Kathryne Hitch, MD - Primary  PHYSICIAN ASSISTANT: Rexene Edison, PA-C  ASSISTANTS: Tomma Lightning, RNFA   ANESTHESIA:   local, regional, and spinal  EBL:  50 mL   COUNTS:  YES  TOURNIQUET:   Total Tourniquet Time Documented: Thigh (Right) - 49 minutes Total: Thigh (Right) - 49 minutes   DICTATION: .Other Dictation: Dictation Number [id number]  PLAN OF CARE: Admit for overnight observation  PATIENT DISPOSITION:  PACU - hemodynamically stable.   Delay start of Pharmacological VTE agent (>24hrs) due to surgical blood loss or risk of bleeding: no

## 2022-04-03 NOTE — Anesthesia Postprocedure Evaluation (Signed)
Anesthesia Post Note  Patient: Tony Morrison  Procedure(s) Performed: RIGHT KNEE ARTHROPLASTY (Right: Knee)     Patient location during evaluation: PACU Anesthesia Type: Spinal Level of consciousness: awake and alert Pain management: pain level controlled Vital Signs Assessment: post-procedure vital signs reviewed and stable Respiratory status: spontaneous breathing and respiratory function stable Cardiovascular status: blood pressure returned to baseline and stable Postop Assessment: spinal receding Anesthetic complications: no   No notable events documented.  Last Vitals:  Vitals:   04/03/22 1522 04/03/22 1757  BP: 132/77 130/72  Pulse: (!) 52 (!) 57  Resp: 14 16  Temp:  36.6 C  SpO2: 98% 96%    Last Pain:  Vitals:   04/03/22 1757  TempSrc: Oral  PainSc:                  Kennieth Rad

## 2022-04-03 NOTE — Plan of Care (Signed)
  Problem: Activity: Goal: Ability to avoid complications of mobility impairment will improve Outcome: Progressing   Problem: Clinical Measurements: Goal: Postoperative complications will be avoided or minimized Outcome: Progressing   Problem: Pain Management: Goal: Pain level will decrease with appropriate interventions Outcome: Progressing   

## 2022-04-03 NOTE — Anesthesia Procedure Notes (Signed)
Procedure Name: MAC Date/Time: 04/03/2022 11:28 AM  Performed by: Niel Hummer, CRNAPre-anesthesia Checklist: Patient identified, Emergency Drugs available, Suction available and Patient being monitored Oxygen Delivery Method: Simple face mask

## 2022-04-03 NOTE — Op Note (Unsigned)
NAME: Tony Morrison, CHARLET MEDICAL RECORD NO: [id number] ACCOUNT NO: [id number] DATE OF BIRTH: 28-Nov-1955 FACILITY: [facility]ATION: [id number] PHYSICIAN: Vanita Panda. Magnus Ivan, MD  Operative Report   DATE OF PROCEDURE: 04/03/2022  PREOPERATIVE DIAGNOSIS:  Severe osteoarthritis and degenerative joint disease, right knee.  POSTOPERATIVE DIAGNOSIS:  Severe osteoarthritis and degenerative joint disease, right knee.  PROCEDURE:  Right cemented total knee arthroplasty.  IMPLANTS:  Biomet/Zimmer Persona knee system with size 9 standard right CR femur, size F right tibial tray, 10 mm thickness medial congruent right polyethylene insert, 35 mm patellar button.  SURGEON:  Vanita Panda. Magnus Ivan, MD  ASSISTANT:  Rexene Edison, PA-C  ANESTHESIA:   1.  Right lower extremity adductor canal block. 2.  Spinal. 3.  Local with 0.5% Marcaine with epinephrine.  TOURNIQUET TIME:  Under 1 hour.  ANTIBIOTICS:  2 g IV Ancef.  BLOOD LOSS:  Less than 100 mL  COMPLICATIONS:  None.  INDICATIONS:  The patient is a 66 year old gentleman that I know for many years now.  He has dealt with debilitating arthritis involving his right knee.  At this point, he has tried and failed all forms of conservative treatment for many years now and  his right knee pain is daily and is detrimentally affecting his mobility, his quality of life and activities of daily living.  At this point, we have recommended total knee arthroplasty and he does wish to proceed with this as well.  We talked in length  and detail about the risk of acute blood loss anemia, nerve or vessel injury, fracture, infection, DVT, implant failure, and skin and soft tissue issues.  He understands our goals are decreased pain, improve mobility and overall improve quality of life.  DESCRIPTION OF PROCEDURE:  After informed consent was obtained, appropriate right knee was marked.  Anesthesia was obtained a right lower extremity block in the holding room.  He was  then brought to the operating room and sat up on the operating table  where spinal anesthesia was obtained.  He was laid in supine position on the operating table.  Foley catheter was placed and a nonsterile tourniquet was placed around his upper right thigh.  His right thigh, knee, leg, ankle and foot were prepped and  draped with DuraPrep and sterile drapes including a sterile stockinette.  A timeout was called and he was identified as correct patient, correct right knee.  We then used Esmarch to wrap that leg and tourniquet was inflated to 300 mm of pressure.  I then  made a direct midline incision over the patella and carried this proximally and distally.  Dissection was carried down to the knee joint and a medial parapatellar arthrotomy was carried out, finding a moderate joint effusion.  With the knee in a flexed  position, we removed remnants of the ACL, medial and lateral meniscus, and osteophytes in all 3 compartments of the knee.  We found areas of full-thickness cartilage loss.  We then used extramedullary cutting guide for making our proximal tibia cut,  correcting for varus and valgus in a 3-degree slope with taking 2 mm off the low side.  We made this cut without difficulty.  We then went to the femur, made our distal femoral cut based off an intramedullary guide for a right knee at 5 degrees  externally rotated and a 10 mm distal femoral cut.  We made this cut without difficulty and brought the knee back down to full extension and a 10 mm extension block had achieved full  extension for [place].  We then went back to the femur and put our femoral  sizing guide based off the epicondylar axis and Whitesides line.  Based off this, we chose a size 9 femur.  We put a 4-in-1 cutting block for a size 9 femur, made our anterior and posterior cuts, followed by our chamfer cuts.  We did not have to make a  femoral box cut.  We then turned attention to the tibia.  We chose a size F right tibial tray for  coverage over the tibial plateau, setting rotation off the tibial tubercle and the femur.  We did our keel punch and drill hole off of this. With a size F  right tibia, we trialled out a right 9 femur and a 10 mm medial congruent right polyethylene insert.  We were pleased with range of motion and stability with that insert.  We then made our patellar cut and drilled three holes for size 35 patellar button.   All trial instrumentation was then put through range of motion of the knee.  We were pleased with range of motion and stability.  We then removed all trial instrumentation from the knee and irrigated the knee with normal saline solution.  We placed our  Marcaine with epinephrine around the arthrotomy.  Cement was mixed and then with the knee in a flexed position, we cemented our Biomet/Zimmer Persona tibial tray for a right knee size F, followed by our size 9 right CR standard femur.  We placed our  medial congruent, fixed bearing, size 10 right polyethylene insert and cemented our size 35 patellar button.  We then held the knee fully extended and compressed until the cement hardened.  Once it hardened, we let the tourniquet down and hemostasis was  obtained with electrocautery.  We then closed the arthrotomy with interrupted #1 Vicryl suture followed by 0 Vicryl to close the deep tissue and 2-0 Vicryl to close subcutaneous tissue.  The skin was closed with staples.  A well-padded sterile dressing  was applied.  The patient was taken to recovery room in stable condition with all final counts being correct and no complications noted.  Of note, Rexene Edison, PA-C did assist in entire case and assistance was crucial and medically necessary for  facilitating all aspects of this case including soft tissue management and retracting, in helping guide implant placement, and a layered closure of the wound.   VAI D: 04/03/2022 12:50:38 pm T: 04/03/2022 9:49:00 pm  JOB: [id number]/ [id number]

## 2022-04-03 NOTE — Interval H&P Note (Signed)
History and Physical Interval Note: The patient understands fully that he is here today for a right total knee arthroplasty to treat his severe right knee osteoarthritis.  There has been no acute or interval changes in medical status.  See H&P.  The risks and benefits of surgery been explained in detail and informed consent is obtained.  The right operative knee has been marked.  04/03/2022 10:14 AM  Tony Morrison  has presented today for surgery, with the diagnosis of OSTEOARTHRITIS / DEGENERATIVE JOINT DISEASE RIGHT KNEE.  The various methods of treatment have been discussed with the patient and family. After consideration of risks, benefits and other options for treatment, the patient has consented to  Procedure(s): RIGHT KNEE ARTHROPLASTY (Right) as a surgical intervention.  The patient's history has been reviewed, patient examined, no change in status, stable for surgery.  I have reviewed the patient's chart and labs.  Questions were answered to the patient's satisfaction.     Kathryne Hitch

## 2022-04-03 NOTE — Anesthesia Preprocedure Evaluation (Signed)
Anesthesia Evaluation  Patient identified by MRN, date of birth, ID band Patient awake    Reviewed: Allergy & Precautions, NPO status , Patient's Chart, lab work & pertinent test results  History of Anesthesia Complications (+) PONV and history of anesthetic complications  Airway Mallampati: III  TM Distance: >3 FB     Dental  (+) Dental Advisory Given   Pulmonary sleep apnea , former smoker,    breath sounds clear to auscultation       Cardiovascular hypertension, Pt. on medications + CAD   Rhythm:Regular Rate:Normal     Neuro/Psych negative neurological ROS     GI/Hepatic Neg liver ROS, GERD  ,  Endo/Other  diabetes, Type 2, Insulin Dependent, Oral Hypoglycemic AgentsHypothyroidism   Renal/GU negative Renal ROS     Musculoskeletal  (+) Arthritis ,   Abdominal   Peds  Hematology negative hematology ROS (+)   Anesthesia Other Findings   Reproductive/Obstetrics                             Lab Results  Component Value Date   WBC 5.2 03/25/2022   HGB 13.6 03/25/2022   HCT 41.5 03/25/2022   MCV 83.7 03/25/2022   PLT 260 03/25/2022   Lab Results  Component Value Date   CREATININE 1.04 03/25/2022   BUN 12 03/25/2022   NA 135 03/25/2022   K 4.1 03/25/2022   CL 101 03/25/2022   CO2 24 03/25/2022    Anesthesia Physical Anesthesia Plan  ASA: 3  Anesthesia Plan: Spinal   Post-op Pain Management: Regional block*, Tylenol PO (pre-op)* and Toradol IV (intra-op)*   Induction:   PONV Risk Score and Plan: 2 and Propofol infusion, Dexamethasone, Ondansetron and Treatment may vary due to age or medical condition  Airway Management Planned: Natural Airway and Simple Face Mask  Additional Equipment:   Intra-op Plan:   Post-operative Plan:   Informed Consent: I have reviewed the patients History and Physical, chart, labs and discussed the procedure including the risks, benefits  and alternatives for the proposed anesthesia with the patient or authorized representative who has indicated his/her understanding and acceptance.       Plan Discussed with: CRNA  Anesthesia Plan Comments:         Anesthesia Quick Evaluation

## 2022-04-03 NOTE — Anesthesia Procedure Notes (Signed)
Anesthesia Regional Block: Adductor canal block   Pre-Anesthetic Checklist: , timeout performed,  Correct Patient, Correct Site, Correct Laterality,  Correct Procedure, Correct Position, site marked,  Risks and benefits discussed,  Surgical consent,  Pre-op evaluation,  At surgeon's request and post-op pain management  Laterality: Right  Prep: chloraprep       Needles:  Injection technique: Single-shot  Needle Type: Echogenic Needle     Needle Length: 9cm  Needle Gauge: 21     Additional Needles:   Procedures:,,,, ultrasound used (permanent image in chart),,    Narrative:  Start time: 04/03/2022 10:51 AM End time: 04/03/2022 10:56 AM Injection made incrementally with aspirations every 5 mL.  Performed by: Personally  Anesthesiologist: Marcene Duos, MD

## 2022-04-03 NOTE — Anesthesia Procedure Notes (Signed)
Spinal  Patient location during procedure: OR Start time: 04/03/2022 11:30 AM End time: 04/03/2022 11:34 AM Reason for block: surgical anesthesia Staffing Performed: resident/CRNA  Resident/CRNA: Niel Hummer, CRNA Performed by: Niel Hummer, CRNA Authorized by: Suzette Battiest, MD   Preanesthetic Checklist Completed: patient identified, IV checked, risks and benefits discussed, surgical consent, monitors and equipment checked, pre-op evaluation and timeout performed Spinal Block Patient position: sitting Prep: DuraPrep Patient monitoring: heart rate, continuous pulse ox, blood pressure and cardiac monitor Approach: midline Location: L3-4 Injection technique: single-shot Needle Needle type: Pencan  Needle gauge: 24 G Needle length: 10 cm Assessment Events: CSF return Additional Notes Expiration date of kit checked. Clear CSF flow prior to injection. Dr. Ola Spurr present

## 2022-04-04 ENCOUNTER — Other Ambulatory Visit (HOSPITAL_COMMUNITY): Payer: Self-pay

## 2022-04-04 DIAGNOSIS — M1711 Unilateral primary osteoarthritis, right knee: Secondary | ICD-10-CM | POA: Diagnosis not present

## 2022-04-04 LAB — CBC
HCT: 33.6 % — ABNORMAL LOW (ref 39.0–52.0)
Hemoglobin: 11.1 g/dL — ABNORMAL LOW (ref 13.0–17.0)
MCH: 28.2 pg (ref 26.0–34.0)
MCHC: 33 g/dL (ref 30.0–36.0)
MCV: 85.3 fL (ref 80.0–100.0)
Platelets: 213 10*3/uL (ref 150–400)
RBC: 3.94 MIL/uL — ABNORMAL LOW (ref 4.22–5.81)
RDW: 17.9 % — ABNORMAL HIGH (ref 11.5–15.5)
WBC: 10.1 10*3/uL (ref 4.0–10.5)
nRBC: 0 % (ref 0.0–0.2)

## 2022-04-04 LAB — BASIC METABOLIC PANEL
Anion gap: 9 (ref 5–15)
BUN: 19 mg/dL (ref 8–23)
CO2: 22 mmol/L (ref 22–32)
Calcium: 8.4 mg/dL — ABNORMAL LOW (ref 8.9–10.3)
Chloride: 105 mmol/L (ref 98–111)
Creatinine, Ser: 1.04 mg/dL (ref 0.61–1.24)
GFR, Estimated: >60 mL/min (ref >60)
Glucose, Bld: 283 mg/dL — ABNORMAL HIGH (ref 70–99)
Potassium: 3.9 mmol/L (ref 3.5–5.1)
Sodium: 136 mmol/L (ref 135–145)

## 2022-04-04 LAB — GLUCOSE, CAPILLARY: Glucose-Capillary: 324 mg/dL — ABNORMAL HIGH (ref 70–99)

## 2022-04-04 MED ORDER — ASPIRIN 81 MG PO CHEW
81.0000 mg | CHEWABLE_TABLET | Freq: Two times a day (BID) | ORAL | 0 refills | Status: AC
  Filled 2022-04-04: qty 30, 15d supply, fill #0

## 2022-04-04 MED ORDER — TIZANIDINE HCL 4 MG PO TABS
4.0000 mg | ORAL_TABLET | Freq: Four times a day (QID) | ORAL | 0 refills | Status: DC | PRN
  Filled 2022-04-04: qty 30, 8d supply, fill #0

## 2022-04-04 MED ORDER — OXYCODONE HCL 5 MG PO TABS
5.0000 mg | ORAL_TABLET | ORAL | 0 refills | Status: DC | PRN
  Filled 2022-04-04: qty 30, 3d supply, fill #0

## 2022-04-04 NOTE — Progress Notes (Addendum)
Physical Therapy Treatment Patient Details Name: Tony Morrison MRN: [id number] DOB: [birth year] Today's Date: 04/04/2022   History of Present Illness Pt s/p R TKR and with hx of DM, CAD, glaucoma and obesity    PT Comments    Pt continues motivated and progressing well with mobility.  Pt up to ambulate in hall, negotiated stairs, back to bed and reviewed application of ice machine.  Pt hopeful for dc home this date   Recommendations for follow up therapy are one component of a multi-disciplinary discharge planning process, led by the attending physician.  Recommendations may be updated based on patient status, additional functional criteria and insurance authorization.  Follow Up Recommendations  Follow physician's recommendations for discharge plan and follow up therapies     Assistance Recommended at Discharge Intermittent Supervision/Assistance  Patient can return home with the following A little help with walking and/or transfers;A little help with bathing/dressing/bathroom;Assist for transportation;Assistance with cooking/housework;Help with stairs or ramp for entrance   Equipment Recommendations  Rolling walker (2 wheels) (pt now states no RW available to him at home)    Recommendations for Other Services       Precautions / Restrictions Precautions Precautions: Knee;Fall Required Braces or Orthoses: Knee Immobilizer - Right Knee Immobilizer - Right: Discontinue once straight leg raise with < 10 degree lag Restrictions Weight Bearing Restrictions: No Other Position/Activity Restrictions: WBAT     Mobility  Bed Mobility Overal bed mobility: Needs Assistance Bed Mobility: Sit to Supine     Supine to sit: Min guard Sit to supine: Supervision   General bed mobility comments: for safety only    Transfers Overall transfer level: Needs assistance Equipment used: Rolling walker (2 wheels) Transfers: Sit to/from Stand Sit to Stand: Min guard, Supervision            General transfer comment: cues for LE management and use of UEs to self assist    Ambulation/Gait Ambulation/Gait assistance: Min guard, Supervision Gait Distance (Feet): 50 Feet Assistive device: Rolling walker (2 wheels) Gait Pattern/deviations: Step-to pattern, Decreased step length - right, Decreased step length - left, Shuffle, Trunk flexed Gait velocity: decr     General Gait Details: cues for sequence, posture and position from RW   Stairs Stairs: Yes Stairs assistance: Min assist Stair Management: One rail Left, Step to pattern, Forwards, With crutches, With cane Number of Stairs: 5 General stair comments: cues for sequence;  attempted cane and rail but cane did not offer enough support so crutch utilized   Wheelchair Mobility    Modified Rankin (Stroke Patients Only)       Balance Overall balance assessment: Needs assistance Sitting-balance support: No upper extremity supported, Feet supported Sitting balance-Leahy Scale: Good     Standing balance support: No upper extremity supported Standing balance-Leahy Scale: Fair                              Cognition Arousal/Alertness: Awake/alert Behavior During Therapy: WFL for tasks assessed/performed Overall Cognitive Status: Within Functional Limits for tasks assessed                                          Exercises Total Joint Exercises Ankle Circles/Pumps: AROM, Both, 20 reps, Supine Quad Sets: AROM, Both, 10 reps, Supine Heel Slides: AAROM, Right, 15 reps, Supine Straight Leg Raises: AAROM, AROM, Right, 10  reps, Supine    General Comments        Pertinent Vitals/Pain Pain Assessment Pain Assessment: 0-10 Pain Score: 6  Pain Location: R knee Pain Descriptors / Indicators: Aching, Sore Pain Intervention(s): Monitored during session, Premedicated before session, Limited activity within patient's tolerance, Ice applied    Home Living Family/patient expects to  be discharged to:: Private residence Living Arrangements: Spouse/significant other Available Help at Discharge: Family Type of Home: House Home Access: Stairs to enter Entrance Stairs-Rails: Right Entrance Stairs-Number of Steps: 3   Home Layout: One level Home Equipment: Agricultural consultant (2 wheels);Cane - single point;BSC/3in1      Prior Function            PT Goals (current goals can now be found in the care plan section) Acute Rehab PT Goals Patient Stated Goal: Regain IND PT Goal Formulation: With patient Time For Goal Achievement: 04/09/22 Potential to Achieve Goals: Good Progress towards PT goals: Progressing toward goals    Frequency    7X/week      PT Plan Current plan remains appropriate    Co-evaluation              AM-PAC PT "6 Clicks" Mobility   Outcome Measure  Help needed turning from your back to your side while in a flat bed without using bedrails?: A Little Help needed moving from lying on your back to sitting on the side of a flat bed without using bedrails?: A Little Help needed moving to and from a bed to a chair (including a wheelchair)?: A Little Help needed standing up from a chair using your arms (e.g., wheelchair or bedside chair)?: A Little Help needed to walk in hospital room?: A Little Help needed climbing 3-5 steps with a railing? : A Little 6 Click Score: 18    End of Session Equipment Utilized During Treatment: Gait belt Activity Tolerance: Patient tolerated treatment well Patient left: in bed;with call bell/phone within reach Nurse Communication: Mobility status PT Visit Diagnosis: Difficulty in walking, not elsewhere classified (R26.2)     Time: 5643-3295 PT Time Calculation (min) (ACUTE ONLY): 35 min  Charges:  $Gait Training: 8-22 mins $Therapeutic Activity: 8-22 mins                    Mauro Kaufmann PT Acute Rehabilitation Services Pager [phone] Office [phone]    Asbury Hair 04/04/2022, 2:12  PM

## 2022-04-04 NOTE — Plan of Care (Signed)
  Problem: Education: Goal: Knowledge of the prescribed therapeutic regimen will improve Outcome: Progressing   Problem: Pain Management: Goal: Pain level will decrease with appropriate interventions Outcome: Progressing   Problem: Nutrition: Goal: Adequate nutrition will be maintained Outcome: Progressing   

## 2022-04-04 NOTE — Plan of Care (Signed)
Pt stable at this time. Pt to d/c home when dme rolling walker arrives. Pt dressing remains clean, dry, and  intact.

## 2022-04-04 NOTE — Progress Notes (Signed)
Pt stable at time of d/c instructions and education. No needs at this time. Pt dressing clean, dry, and intact. Pt had dme delivered. Rn will continue to monitor until pt ride arrives.

## 2022-04-04 NOTE — TOC Transition Note (Signed)
Transition of Care [facility]) - CM/SW Discharge Note   Patient Details  Name: Tony Morrison MRN: [id number] Date of Birth: 04-02-1956  Transition of Care [facility]) CM/SW Contact:  Darleene Cleaver, LCSW Phone Number: 04/04/2022, 12:27 PM   Clinical Narrative:     Patient will be going home with home health through [facility] which was prearranged.  CSW signing off please reconsult with any other social work needs, home health agency has been notified of planned discharge.  CSW was informed that patient will need a rolling walker, CSW spoke to [place] at [facility], and they will deliver to room before he leaves.    Final next level of care: Home w Home Health Services Barriers to Discharge: Barriers Resolved   Patient Goals and CMS Choice Patient states their goals for this hospitalization and ongoing recovery are:: To return back home with home health.      Discharge Placement                       Discharge Plan and Services                DME Arranged: Walker rolling DME Agency: [facility] Date DME Agency Contacted: 04/04/22 Time DME Agency Contacted: 1226 Representative spoke with at DME Agency: Leavy Cella HH Arranged: PT HH Agency: [facility]        Social Determinants of Health (SDOH) Interventions     Readmission Risk Interventions     No data to display

## 2022-04-04 NOTE — Progress Notes (Signed)
Subjective: 1 Day Post-Op Procedure(s) (LRB): RIGHT KNEE ARTHROPLASTY (Right) Patient reports pain as moderate.    Objective: Vital signs in last 24 hours: Temp:  [97.5 F (36.4 C)-97.9 F (36.6 C)] 97.8 F (36.6 C) (08/26 0928) Pulse Rate:  [47-70] 57 (08/26 0928) Resp:  [10-18] 16 (08/26 0928) BP: (103-147)/(50-77) 147/74 (08/26 0928) SpO2:  [92 %-100 %] 99 % (08/26 0928)  Intake/Output from previous day: 08/25 0701 - 08/26 0700 In: 2220 [P.O.:270; I.V.:1700; IV Piggyback:250] Out: 2625 [Urine:2575; Blood:50] Intake/Output this shift: Total I/O In: 824.5 [P.O.:600; I.V.:224.5] Out: 0   Recent Labs    04/04/22 0410  HGB 11.1*   Recent Labs    04/04/22 0410  WBC 10.1  RBC 3.94*  HCT 33.6*  PLT 213   Recent Labs    04/04/22 0410  NA 136  K 3.9  CL 105  CO2 22  BUN 19  CREATININE 1.04  GLUCOSE 283*  CALCIUM 8.4*   No results for input(s): "LABPT", "INR" in the last 72 hours.  Sensation intact distally Intact pulses distally Dorsiflexion/Plantar flexion intact Incision: dressing C/D/I Compartment soft   Assessment/Plan: 1 Day Post-Op Procedure(s) (LRB): RIGHT KNEE ARTHROPLASTY (Right) Up with therapy Discharge home with home health      Kathryne Hitch 04/04/2022, 11:44 AM

## 2022-04-04 NOTE — Discharge Instructions (Signed)

## 2022-04-04 NOTE — Evaluation (Signed)
Physical Therapy Evaluation Patient Details Name: Tony Morrison MRN: [id number] DOB: 06-20-1956 Today's Date: 04/04/2022  History of Present Illness  Pt s/p R TKR and with hx of DM, CAD, glaucoma and obesity  Clinical Impression  Pt s/p R TKR and presents with decreased R LE strength/ROM and post op pain limiting functional mobility.  Pt should progress to dc home with family assist and reports HHPT scheduled to start 04/05/22.     Recommendations for follow up therapy are one component of a multi-disciplinary discharge planning process, led by the attending physician.  Recommendations may be updated based on patient status, additional functional criteria and insurance authorization.  Follow Up Recommendations Follow physician's recommendations for discharge plan and follow up therapies      Assistance Recommended at Discharge Intermittent Supervision/Assistance  Patient can return home with the following  A little help with walking and/or transfers;A little help with bathing/dressing/bathroom;Assist for transportation;Assistance with cooking/housework;Help with stairs or ramp for entrance    Equipment Recommendations None recommended by PT  Recommendations for Other Services       Functional Status Assessment Patient has had a recent decline in their functional status and demonstrates the ability to make significant improvements in function in a reasonable and predictable amount of time.     Precautions / Restrictions Precautions Precautions: Knee;Fall Required Braces or Orthoses: Knee Immobilizer - Right Knee Immobilizer - Right: Discontinue once straight leg raise with < 10 degree lag (Pt performed IND SLR this am) Restrictions Weight Bearing Restrictions: No Other Position/Activity Restrictions: WBAT      Mobility  Bed Mobility Overal bed mobility: Needs Assistance Bed Mobility: Supine to Sit     Supine to sit: Min guard     General bed mobility comments: for  safety only    Transfers Overall transfer level: Needs assistance Equipment used: Rolling walker (2 wheels) Transfers: Sit to/from Stand Sit to Stand: Min assist, Min guard           General transfer comment: cues for LE management and use of UEs to self assist    Ambulation/Gait Ambulation/Gait assistance: Min guard Gait Distance (Feet): 80 Feet Assistive device: Rolling walker (2 wheels) Gait Pattern/deviations: Step-to pattern, Decreased step length - right, Decreased step length - left, Shuffle, Trunk flexed Gait velocity: decr     General Gait Details: cues for sequence, posture and position from [employer]            Wheelchair Mobility    Modified Rankin (Stroke Patients Only)       Balance Overall balance assessment: Needs assistance Sitting-balance support: No upper extremity supported, Feet supported Sitting balance-Leahy Scale: Good     Standing balance support: No upper extremity supported Standing balance-Leahy Scale: Fair                               Pertinent Vitals/Pain Pain Assessment Pain Assessment: 0-10 Pain Score: 5  Pain Location: R knee Pain Descriptors / Indicators: Aching, Sore Pain Intervention(s): Limited activity within patient's tolerance, Monitored during session, Premedicated before session, Ice applied    Home Living Family/patient expects to be discharged to:: Private residence Living Arrangements: Spouse/significant other Available Help at Discharge: Family Type of Home: House Home Access: Stairs to enter Entrance Stairs-Rails: Right Entrance Stairs-Number of Steps: 3   Home Layout: One level Home Equipment: Agricultural consultant (2 wheels);Cane - single point;BSC/3in1      Prior Function Prior Level  of Function : Independent/Modified Independent                     Hand Dominance        Extremity/Trunk Assessment   Upper Extremity Assessment Upper Extremity Assessment: Overall WFL for  tasks assessed    Lower Extremity Assessment Lower Extremity Assessment: RLE deficits/detail RLE Deficits / Details: 3/5 quads with IND SLR; AAROM at knee -5 - 40    Cervical / Trunk Assessment Cervical / Trunk Assessment: Normal  Communication   Communication: No difficulties  Cognition Arousal/Alertness: Awake/alert Behavior During Therapy: WFL for tasks assessed/performed Overall Cognitive Status: Within Functional Limits for tasks assessed                                          General Comments      Exercises Total Joint Exercises Ankle Circles/Pumps: AROM, Both, 20 reps, Supine Quad Sets: AROM, Both, 10 reps, Supine Heel Slides: AAROM, Right, 15 reps, Supine Straight Leg Raises: AAROM, AROM, Right, 10 reps, Supine   Assessment/Plan    PT Assessment Patient needs continued PT services  PT Problem List Decreased strength;Decreased range of motion;Decreased activity tolerance;Decreased balance;Decreased mobility;Decreased knowledge of use of DME;Pain       PT Treatment Interventions DME instruction;Gait training;Stair training;Functional mobility training;Therapeutic activities;Therapeutic exercise;Patient/family education    PT Goals (Current goals can be found in the Care Plan section)  Acute Rehab PT Goals Patient Stated Goal: Regain IND PT Goal Formulation: With patient Time For Goal Achievement: 04/09/22 Potential to Achieve Goals: Good    Frequency 7X/week     Co-evaluation               AM-PAC PT "6 Clicks" Mobility  Outcome Measure Help needed turning from your back to your side while in a flat bed without using bedrails?: A Little Help needed moving from lying on your back to sitting on the side of a flat bed without using bedrails?: A Little Help needed moving to and from a bed to a chair (including a wheelchair)?: A Little Help needed standing up from a chair using your arms (e.g., wheelchair or bedside chair)?: A  Little Help needed to walk in hospital room?: A Little Help needed climbing 3-5 steps with a railing? : A Little 6 Click Score: 18    End of Session Equipment Utilized During Treatment: Gait belt Activity Tolerance: Patient tolerated treatment well Patient left: in chair;with call bell/phone within reach Nurse Communication: Mobility status PT Visit Diagnosis: Difficulty in walking, not elsewhere classified (R26.2)    Time: 1478-2956 PT Time Calculation (min) (ACUTE ONLY): 32 min   Charges:   PT Evaluation $PT Eval Low Complexity: 1 Low PT Treatments $Therapeutic Exercise: 8-22 mins        Mauro Kaufmann PT Acute Rehabilitation Services Pager [phone] Office [phone]   Tnya Ades 04/04/2022, 12:09 PM

## 2022-04-04 NOTE — Plan of Care (Signed)
  Problem: Pain Management: Goal: Pain level will decrease with appropriate interventions Outcome: Progressing   Problem: Skin Integrity: Goal: Will show signs of wound healing Outcome: Progressing   Problem: Clinical Measurements: Goal: Respiratory complications will improve Outcome: Progressing   Problem: Clinical Measurements: Goal: Cardiovascular complication will be avoided Outcome: Progressing   

## 2022-04-05 NOTE — Discharge Summary (Signed)
Patient ID: Tony Morrison MRN: [id number] DOB/AGE: 1956-01-25 66 y.o.  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 diuretic held   Hypothyroidism    Obesity    PONV (postoperative nausea and vomiting)    once   Sleep apnea    dont use CPAP   Syncopal episodes    likely due to orthostatic hypotension    Surgeries: Procedure(s): RIGHT KNEE ARTHROPLASTY on 04/03/2022   Consultants:   Discharged Condition: Improved  Hospital Course: Tony Morrison is an 66 y.o. male who was admitted 04/03/2022 for operative treatment ofUnilateral primary osteoarthritis, right knee. Patient has severe unremitting pain that affects sleep, daily activities, and work/hobbies. After pre-op clearance the patient was taken to the operating room on 04/03/2022 and underwent  Procedure(s): RIGHT KNEE ARTHROPLASTY.    Patient was given perioperative antibiotics:  Anti-infectives (From admission, onward)    Start     Dose/Rate Route Frequency Ordered Stop   04/03/22 1800  ceFAZolin (ANCEF) IVPB 1 g/50 mL premix        1 g 100 mL/hr over 30 Minutes Intravenous Every 6 hours 04/03/22 1518 04/04/22 0056    04/03/22 0930  ceFAZolin (ANCEF) IVPB 2g/100 mL premix        2 g 200 mL/hr over 30 Minutes Intravenous On call to O.R. 04/03/22 7829 04/03/22 1205        Patient was given sequential compression devices, early ambulation, and chemoprophylaxis to prevent DVT.  Patient benefited maximally from hospital stay and there were no complications.    Recent vital signs: No data found.   Recent laboratory studies:  Recent Labs    04/04/22 0410  WBC 10.1  HGB 11.1*  HCT 33.6*  PLT 213  NA 136  K 3.9  CL 105  CO2 22  BUN 19  CREATININE 1.04  GLUCOSE 283*  CALCIUM 8.4*     Discharge Medications:   Allergies as of 04/04/2022       Reactions   Codeine    GI uspet   Prednisone    Makes him "ill"        Medication List     TAKE these medications    ALPRAZolam 0.5 MG tablet Commonly known as: XANAX Take one tablet (0.5 mg dose) by mouth 3 (three) times a day as needed for Sleep or Anxiety.   ALPRAZolam 0.5 MG tablet Commonly known as: XANAX Take one tablet (0.5 mg dose) by mouth 3 (three) times a day as needed for Sleep  or Anxiety.   amLODipine 5 MG tablet Commonly known as: NORVASC Take 1 tablet by mouth daily.   aspirin 81 MG chewable tablet Chew 1 tablet (81 mg total) by mouth 2 (two) times daily. What changed:  how much to take when to take this   carvedilol 25 MG tablet Commonly known as: COREG Take 1 tablet (25 mg dose) by mouth 2 (two) times daily with a meal.   fenofibrate 160 MG tablet Take 1 tablet by mouth daily.   ferrous sulfate 325 (65 FE) MG EC tablet Take 1 tablet by mouth 2 times daily. What changed:  how much to take when to take this   FLUoxetine 40 MG capsule Commonly known as: PROZAC Take 1 capsule by mouth daily.   gabapentin 100 MG capsule Commonly known as: NEURONTIN Take 1 capsule (100 mg total) by mouth 3 (three) times daily. What changed:  when to take this reasons to take this   glimepiride 4 MG tablet Commonly known  as: AMARYL Take 1 tablet by mouth 2 times daily.   HumaLOG KwikPen 100 UNIT/ML KwikPen Generic drug: insulin lispro Inject 10 Units into the skin 3 times daily with meals. What changed:  how much to take when to take this   hydrochlorothiazide 25 MG tablet Commonly known as: HYDRODIURIL Take one half tablet (12.5 mg dose) by mouth daily.   ibuprofen 200 MG tablet Commonly known as: ADVIL Take 800 mg by mouth every 6 (six) hours as needed for moderate pain.   Janumet XR 50-1000 MG Tb24 Generic drug: SitaGLIPtin-MetFORMIN HCl Take one tablet by mouth daily.   Janumet XR 50-1000 MG Tb24 Generic drug: SitaGLIPtin-MetFORMIN HCl Take 1 tablet by mouth daily.   Lantus SoloStar 100 UNIT/ML Solostar Pen Generic drug: insulin glargine INJECT 70 UNITS INTO THE SKIN EVERY MORNING   levothyroxine 50 MCG tablet Commonly known as: SYNTHROID Take 1 tablet by mouth daily.   levothyroxine 50 MCG tablet Commonly known as: SYNTHROID Take one tablet (50 mcg dose) by mouth daily.   lisinopril 40 MG tablet Commonly known as: ZESTRIL TAKE 1 TABLET BY MOUTH ONCE A DAY   OneTouch Verio test strip Generic drug: glucose blood Use to test blood sugar 3 times a day   OneTouch Verio test strip Generic drug: glucose blood Use to check blood sugar 3 times a day.   oxyCODONE 5 MG immediate release tablet Commonly known as: Oxy IR/ROXICODONE Take 1-2 tablets (5-10 mg total) by mouth every 4 (four) hours as needed for moderate pain (pain score 4-6).   pantoprazole 40 MG tablet Commonly known as: PROTONIX Take 1 tablet (40 mg dose) by mouth daily.   rosuvastatin 5 MG tablet Commonly known as: CRESTOR Take 1 tablet by mouth daily.   tiZANidine 4 MG tablet Commonly known as: Zanaflex Take 1 tablet (4 mg total) by mouth every 6 (six) hours as needed for muscle spasms.   Unifine Pentips 31G X 8 MM Misc Generic drug: Insulin Pen Needle USE AS DIRECTED   Unifine Pentips 31G X 8 MM  Misc Generic drug: Insulin Pen Needle USE AS DIRECTED   VITAMIN C PO Take 1 tablet by mouth daily.        Diagnostic Studies: DG Knee Right Port  Result Date: 04/03/2022 CLINICAL DATA:  Provided history: Postop right knee. EXAM: PORTABLE RIGHT KNEE - 1-2 VIEW COMPARISON:  Radiographs of the right knee 12/31/2021 (images available, report unavailable). FINDINGS: Immediate postoperative changes from right total knee arthroplasty. The femoral  and tibial components appear well seated. Overlying soft tissue edema, subcutaneous gas and skin staples. Gas is also present within the joint space. IMPRESSION: Immediate postoperative changes from right total knee arthroplasty. No unexpected finding on the provided views. Electronically Signed   By: Jackey Loge D.O.   On: 04/03/2022 14:22    Disposition: Discharge disposition: 01-Home or Self Care          Follow-up Information     Kathryne Hitch, MD Follow up in 2 week(s).   Specialty: Orthopedic Surgery Contact information: [address] [place] [address] [phone]                  Signed: Kathryne Hitch 04/05/2022, 7:53 PM

## 2022-04-06 ENCOUNTER — Encounter (HOSPITAL_COMMUNITY): Payer: Self-pay | Admitting: Orthopaedic Surgery

## 2022-04-07 ENCOUNTER — Other Ambulatory Visit (HOSPITAL_COMMUNITY): Payer: Self-pay

## 2022-04-07 ENCOUNTER — Telehealth: Payer: Self-pay | Admitting: Oncology

## 2022-04-07 MED ORDER — GABAPENTIN 100 MG PO CAPS
ORAL_CAPSULE | ORAL | 2 refills | Status: DC
  Filled 2022-04-07: qty 90, 30d supply, fill #0
  Filled 2022-05-11: qty 90, 30d supply, fill #1
  Filled 2022-06-30: qty 90, 30d supply, fill #2

## 2022-04-07 NOTE — Telephone Encounter (Signed)
Scheduled appt per 8/29 referral. Pt is aware of appt date and time. Pt is aware to arrive 15 mins prior to appt time and to bring and updated insurance card. Pt is aware of appt location.   

## 2022-04-08 ENCOUNTER — Other Ambulatory Visit (HOSPITAL_COMMUNITY): Payer: Self-pay

## 2022-04-08 ENCOUNTER — Other Ambulatory Visit: Payer: Self-pay | Admitting: Orthopaedic Surgery

## 2022-04-08 ENCOUNTER — Encounter: Payer: Self-pay | Admitting: Orthopaedic Surgery

## 2022-04-08 MED ORDER — HYDROCODONE-ACETAMINOPHEN 10-325 MG PO TABS
1.0000 | ORAL_TABLET | ORAL | 0 refills | Status: DC | PRN
  Filled 2022-04-08: qty 30, 5d supply, fill #0

## 2022-04-09 ENCOUNTER — Other Ambulatory Visit (HOSPITAL_COMMUNITY): Payer: Self-pay

## 2022-04-09 ENCOUNTER — Telehealth: Payer: Self-pay | Admitting: Radiology

## 2022-04-09 ENCOUNTER — Other Ambulatory Visit: Payer: Self-pay | Admitting: Orthopaedic Surgery

## 2022-04-09 MED ORDER — DOXYCYCLINE HYCLATE 100 MG PO TABS
100.0000 mg | ORAL_TABLET | Freq: Two times a day (BID) | ORAL | 0 refills | Status: DC
  Filled 2022-04-09: qty 30, 15d supply, fill #0

## 2022-04-09 NOTE — Telephone Encounter (Signed)
I called and advised them. Pt and wife stated understanding. If it gets worse, pt an wife advised to go to hospital. They stated understanding

## 2022-04-10 ENCOUNTER — Encounter: Payer: Self-pay | Admitting: Orthopaedic Surgery

## 2022-04-13 ENCOUNTER — Other Ambulatory Visit (HOSPITAL_COMMUNITY): Payer: Self-pay

## 2022-04-14 ENCOUNTER — Other Ambulatory Visit (HOSPITAL_COMMUNITY): Payer: Self-pay

## 2022-04-14 MED ORDER — GLIMEPIRIDE 4 MG PO TABS
4.0000 mg | ORAL_TABLET | Freq: Two times a day (BID) | ORAL | 3 refills | Status: DC
  Filled 2022-04-14: qty 60, 30d supply, fill #0
  Filled 2022-05-11: qty 60, 30d supply, fill #1
  Filled 2022-06-17: qty 60, 30d supply, fill #2
  Filled 2022-08-06 – 2022-08-07 (×2): qty 60, 30d supply, fill #3

## 2022-04-16 ENCOUNTER — Encounter: Payer: Self-pay | Admitting: Orthopaedic Surgery

## 2022-04-16 ENCOUNTER — Other Ambulatory Visit: Payer: Self-pay

## 2022-04-16 ENCOUNTER — Ambulatory Visit (INDEPENDENT_AMBULATORY_CARE_PROVIDER_SITE_OTHER): Payer: Medicare Other | Admitting: Orthopaedic Surgery

## 2022-04-16 ENCOUNTER — Other Ambulatory Visit (HOSPITAL_COMMUNITY): Payer: Self-pay

## 2022-04-16 DIAGNOSIS — Z96651 Presence of right artificial knee joint: Secondary | ICD-10-CM

## 2022-04-16 MED ORDER — HYDROCODONE-ACETAMINOPHEN 10-325 MG PO TABS
1.0000 | ORAL_TABLET | ORAL | 0 refills | Status: DC | PRN
  Filled 2022-04-16: qty 30, 5d supply, fill #0

## 2022-04-16 NOTE — Progress Notes (Signed)
Tony Morrison is 2 weeks tomorrow status post a right total knee arthroplasty.  He is doing well.  He has had some postoperative fevers but overall is making good progress in terms of his therapy and range of motion.  The notes from therapy say he lacks full extension by only 2 degrees and he can flex to past 90 degrees.  In the office today his flexion and extension is great.  I did remove the staples in place Steri-Strips.  His calf is soft so he can stop his aspirin.  There is no evidence of infection.  He can get his incision wet in the shower start tomorrow but not submerge underwater.  We would like to get him set up for outpatient physical therapy so we will work on that from our end of things.  I did refill his pain medication which is appropriate.  He knows to wait least 2 more weeks before driving.  All questions and concerns were answered and addressed.  We will see him back in 4 weeks to see how he is doing overall.

## 2022-04-17 ENCOUNTER — Other Ambulatory Visit: Payer: Self-pay | Admitting: Orthopaedic Surgery

## 2022-04-20 ENCOUNTER — Other Ambulatory Visit (HOSPITAL_COMMUNITY): Payer: Self-pay

## 2022-04-20 MED ORDER — TIZANIDINE HCL 4 MG PO TABS
4.0000 mg | ORAL_TABLET | Freq: Four times a day (QID) | ORAL | 0 refills | Status: DC | PRN
  Filled 2022-04-20: qty 30, 8d supply, fill #0

## 2022-04-25 ENCOUNTER — Other Ambulatory Visit (HOSPITAL_COMMUNITY): Payer: Self-pay

## 2022-04-27 ENCOUNTER — Other Ambulatory Visit (HOSPITAL_COMMUNITY): Payer: Self-pay

## 2022-04-27 NOTE — Therapy (Signed)
OUTPATIENT PHYSICAL THERAPY LOWER EXTREMITY EVALUATION   Patient Name: Tony Morrison MRN: [id number] DOB:[birth year], 66 y.o., male Today's Date: 04/29/2022   PT End of Session - 04/29/22 1010     Visit Number 1    Date for PT Re-Evaluation 06/24/22    Authorization Type [employer] Medicare    Progress Note Due on Visit 10    PT Start Time 0931    PT Stop Time 1016    PT Time Calculation (min) 45 min    Activity Tolerance Patient tolerated treatment well    Behavior During Therapy [facility] for tasks assessed/performed             Past Medical History:  Diagnosis Date   Anemia    Anxiety    Arthritis    Chest pressure    cardiac cath; minimal non-obs CAD and normal EF 06/2008   Coronary artery disease    Depression    Diabetes mellitus    x5 years  type 2   Family history of anesthesia complication    mother n/v   GERD (gastroesophageal reflux disease)    Glaucoma    no per pt   Headache(784.0)     headaches   Cluster   Heart murmur    slight per pcp that is retired [name]  no issues    History of peptic ulcer disease    Hyperlipidemia    Hypertension    s/p syncopal episode with facial trauma due to orthostasis 10/2008. diuretic held   Hypothyroidism    Obesity    PONV (postoperative nausea and vomiting)    once   Sleep apnea    dont use CPAP   Syncopal episodes    likely due to orthostatic hypotension   Past Surgical History:  Procedure Laterality Date   left knee surgery     medial meniscus repair x2   MOUTH SURGERY     to make palate wider   NASAL HEMORRHAGE CONTROL N/A 03/24/2013   Procedure: EPISTAXIS CONTROL;  Surgeon: [name], MD;  Location: [facility] OR;  Service: ENT;  Laterality: N/A;   NASAL SEPTOPLASTY W/ TURBINOPLASTY N/A 03/24/2013   Procedure: NASAL SEPTOPLASTY WITH TURBINATE REDUCTION;  Surgeon: [name], MD;  Location: [facility] OR;  Service: ENT;  Laterality: N/A;   NECK SURGERY     C5-C6   TOTAL KNEE ARTHROPLASTY Right 04/03/2022    Procedure: RIGHT KNEE ARTHROPLASTY;  Surgeon: Kathryne Hitch, MD;  Location: [facility] ORS;  Service: Orthopedics;  Laterality: Right;   VASECTOMY     Patient Active Problem List   Diagnosis Date Noted   Status post total right knee replacement 04/03/2022   Unilateral primary osteoarthritis, right knee 11/23/2019   OBESITY-MORBID (>100') 07/30/2009   SYNCOPE AND COLLAPSE 10/29/2008   OBSTRUCTIVE SLEEP APNEA 09/28/2008   HYPERLIPIDEMIA-MIXED 07/12/2008   HYPERTENSION, BENIGN 07/12/2008   CHEST PAIN-UNSPECIFIED 07/12/2008    PCP: Antony Haste, MD   REFERRING PROVIDER: Doneen Poisson, MD  REFERRING DIAG: s/p Rt TKA  THERAPY DIAG:  Acute pain of right knee - Plan: PT plan of care cert/re-cert  Localized edema - Plan: PT plan of care cert/re-cert  Other abnormalities of gait and mobility - Plan: PT plan of care cert/re-cert  Rationale for Evaluation and Treatment Rehabilitation  ONSET DATE: 04/03/22 (surgery)  SUBJECTIVE:   SUBJECTIVE STATEMENT: Pt  presents to PT s/p Rt TKA that was performed on 04/03/22.  Pt had HHPT x2 weeks after discharge from the hospital.  Pt  arrived without device.  PERTINENT HISTORY: S/p Rt TKA (04/03/22), COPD, hypothyroid, anemia  PAIN:  Are you having pain? Yes: NPRS scale: 4/10 Pain location: Rt knee  Pain description: aching, tight, discomfort  Aggravating factors: standing/walking (limited to 15 minutes),  Relieving factors: sitting, ice, pain medication  PRECAUTIONS: None  WEIGHT BEARING RESTRICTIONS No  FALLS:  Has patient fallen in last 6 months? No  LIVING ENVIRONMENT: Lives with: lives with their family Lives in: House/apartment Stairs: yes  Has following equipment at home: None  OCCUPATION: Works for [employer] work  PLOF: Independent  PATIENT GOALS return to prior level of function.  Improve Rt knee A/ROM, gait, and strength   OBJECTIVE:   DIAGNOSTIC FINDINGS: NA  PATIENT SURVEYS:  FOTO 76 (goal is  18)  COGNITION:  Overall cognitive status: Within functional limits for tasks assessed     SENSATION: WFL  EDEMA:  Circumferential: Lt mid patellar: 17 inches, Rt 18.25 inches   MUSCLE LENGTH: Hamstring flexibility limited on Rt due to quad lad  POSTURE: rounded shoulders and forward head  PALPATION: Reduced scar mobility over distal 1/2 of scar.  Skin is dry and peeling around incision.  Edema and pain bilaterally.   LOWER EXTREMITY ROM:  Active ROM Right eval Left eval  Hip flexion Limited by 25%   Hip extension    Hip abduction    Hip adduction    Hip internal rotation    Hip external rotation    Knee flexion 105   Knee extension 8   Ankle dorsiflexion full   Ankle plantarflexion    Ankle inversion    Ankle eversion     (Blank rows = not tested)  LOWER EXTREMITY MMT:  MMT Right eval Left eval  Hip flexion 4   Hip extension    Hip abduction    Hip adduction 4+   Hip internal rotation    Hip external rotation    Knee flexion 4   Knee extension 4 with lag   Ankle dorsiflexion 4+   Ankle plantarflexion    Ankle inversion    Ankle eversion     (Blank rows = not tested)   Lt hip and knee: 4+/5 GAIT: Distance walked: 50 Assistive device utilized: None Level of assistance: Modified independence Comments: ER at Rt hip, antalgic with reduced time spent in stance on Rt and reduced heel strike   TODAY'S TREATMENT: Date: 04/29/22 Verbal review of HEP issued by home health PT Scar massage instruction NuStep: Level 1x 5 min Game Ready: 3 snowflakes, med compression x 10 min   PATIENT EDUCATION:  Education details: scar massage, review of HEP issued by home health Person educated: Patient Education method: Explanation, Demonstration, and Handouts Education comprehension: verbalized understanding and returned demonstration   HOME EXERCISE PROGRAM: Review of HEP issued by home health PT  ASSESSMENT:  CLINICAL IMPRESSION: Patient is a 66 y.o. male  who was seen today for physical therapy evaluation and treatment for s/p Rt TKA  performed 3 weeks ago.  Pt is not using device today and demonstrates antalgic gait on level surface with ER at Rt hip. Pt negotiates steps with step-to gait pattern.  Rt knee A/ROM is limited at appropriate 3 weeks post op and surgical incision is healing with reduced distal mobility.  Edema, warmth and palpable tenderness about the Rt knee joint.  Pt reports 4/10 Rt knee pain.  Pt has established HEP from home health PT and verbal review today.  Pt will benefit from  skilled PT to address Rt knee ROM, strength, edema and mobility to allow for return to prior level of function.    OBJECTIVE IMPAIRMENTS decreased activity tolerance, decreased balance, decreased endurance, difficulty walking, decreased ROM, decreased strength, hypomobility, increased edema, impaired flexibility, and pain.   ACTIVITY LIMITATIONS lifting, bending, squatting, stairs, transfers, and locomotion level  PARTICIPATION LIMITATIONS: meal prep, driving, shopping, community activity, and yard work  PERSONAL FACTORS 1 comorbidity: s/p TKA  are also affecting patient's functional outcome.   REHAB POTENTIAL: Excellent  CLINICAL DECISION MAKING: Stable/uncomplicated  EVALUATION COMPLEXITY: Low   GOALS: Goals reviewed with patient? Yes  SHORT TERM GOALS: Target date: 05/27/2022  Be independent in initial HEP Baseline: Goal status: INITIAL  2.  Improve Rt LE strength to ascend steps with step-over-step gait pattern and use of 1 rail Baseline: step-to Goal status: INITIAL  3.  Demonstrate symmetry with gait on level surfaces without device  Baseline: antalgia without device  Goal status: INITIAL  4.  Reduce Rt knee pain to allow for standing > or = to 25 minutes without limitation  Baseline: 15 minutes  Goal status: INITIAL  5.  Demonstrate > or = to 110 Rt knee flexion to improve squatting and negotiating steps  Baseline: 105 Goal  status: INITIAL    LONG TERM GOALS: Target date: 06/24/2022   Be independent in advanced HEP Baseline:  Goal status: INITIAL  2.  Improve FOTO to > or = to 75 Baseline: 59 Goal status: INITIAL  3.  Reduce Rt knee pain and improve endurance to stand and walk > or = to 30-45 minutes for community activity. Baseline: 15 min Goal status: INITIAL  4.  Improve Rt knee strength and ROM to negotiate steps with step-over-step gait and use of 1 rail Baseline:  Goal status: INITIAL  5.  Demonstrate > or = to  115-120 degrees of Rt knee flexion to squat for home tasks  Baseline: 105 Goal status: INITIAL    PLAN: PT FREQUENCY: 2x/week  PT DURATION: 8 weeks  PLANNED INTERVENTIONS: Therapeutic exercises, Therapeutic activity, Neuromuscular re-education, Balance training, Gait training, Patient/Family education, Self Care, Joint mobilization, Stair training, Aquatic Therapy, Dry Needling, Electrical stimulation, Cryotherapy, Moist heat, scar mobilization, Taping, Vasopneumatic device, Manual therapy, and Re-evaluation  PLAN FOR NEXT SESSION: review HEP, Game Ready, Rt knee ROM, strength, endurance, gait   Lorrene Reid, PT 04/29/22 11:43 AM   [facility] [address], [address] [place], [place] [address] Phone # [phone] Fax [phone]

## 2022-04-29 ENCOUNTER — Ambulatory Visit: Payer: Medicare Other | Attending: Orthopaedic Surgery

## 2022-04-29 ENCOUNTER — Inpatient Hospital Stay: Payer: Medicare Other | Attending: Oncology | Admitting: Oncology

## 2022-04-29 ENCOUNTER — Other Ambulatory Visit: Payer: Self-pay

## 2022-04-29 VITALS — BP 120/49 | HR 60 | Temp 97.8°F | Resp 18 | Ht 68.0 in | Wt 256.8 lb

## 2022-04-29 DIAGNOSIS — E119 Type 2 diabetes mellitus without complications: Secondary | ICD-10-CM | POA: Insufficient documentation

## 2022-04-29 DIAGNOSIS — D509 Iron deficiency anemia, unspecified: Secondary | ICD-10-CM | POA: Diagnosis present

## 2022-04-29 DIAGNOSIS — E039 Hypothyroidism, unspecified: Secondary | ICD-10-CM | POA: Diagnosis not present

## 2022-04-29 DIAGNOSIS — Z96651 Presence of right artificial knee joint: Secondary | ICD-10-CM | POA: Insufficient documentation

## 2022-04-29 DIAGNOSIS — E669 Obesity, unspecified: Secondary | ICD-10-CM | POA: Diagnosis not present

## 2022-04-29 DIAGNOSIS — R2689 Other abnormalities of gait and mobility: Secondary | ICD-10-CM

## 2022-04-29 DIAGNOSIS — R600 Localized edema: Secondary | ICD-10-CM

## 2022-04-29 DIAGNOSIS — Z87891 Personal history of nicotine dependence: Secondary | ICD-10-CM | POA: Diagnosis not present

## 2022-04-29 DIAGNOSIS — M25561 Pain in right knee: Secondary | ICD-10-CM

## 2022-04-29 NOTE — Patient Instructions (Signed)
Scar Massage  Scar massage is done to improve the mobility of scar, decrease scar tissue from building up, reduce adhesions, and prevent Keloids from forming. Start scar massage after scabs have fallen off by themselves and no open areas. The first few weeks after surgery, it is normal for a scar to appear pink or red and slightly raised. Scars can itch or have areas of numbness. Some scars may be sensitive.   Direct Scar massage: after scar is healed, no opening, no scab  Place pads of two fingers together directly on the scar starting at one end of the scar. Move the fingers up and down across the scar holding 5 seconds one direction.  Then go opposite direction hold 5 seconds.  Move over to the next section of the scar and repeat.  Work your way along the entire length of the scar.   Next make diagonal movements along the scar holding 5 seconds at one direction. Next movement is side to side. Do not rub fingers over the scar.  Instead keep firm pressure and move scar over the tissue it is on top   Scar Lift and Roll 12 weeks after surgery. Pinch a small amount of the scar between your first two fingers and thumb.  Roll the scar between your fingers for 5 to 15 seconds. Move along the scar and repeat until you have massaged the entire length of scar.   Stop the massage and call your doctor if you notice: Increased redness Bleeding from scar Seepage coming from the scar Scar is warmer and has increased pain    

## 2022-04-29 NOTE — Progress Notes (Signed)
Reason for the request:    Anemia  HPI: I was asked by Dr. Levora Angel to evaluate Tony Morrison for evaluation of iron deficiency anemia.  He is a 66 year old man with a history of osteoarthritis, diabetes and GERD who was found to have iron deficiency noted in June 2023.  At that time he had presented to his primary care physician with symptoms of fatigue and tiredness and his iron level was 32 with saturation of 7% and a ferritin of 12.  He was started on oral iron replacement utilizing iron sulfate twice a day.  He was evaluated by [facility] gastroenterology and underwent colonoscopy, endoscopy as well as a capsule endoscopy without any clear evidence of malignancy but did have polyps.  His oral iron therapy has been interrupted during these procedures.  He also underwent right knee replacement on April 03, 2022.  Prior to that his iron panel obtained on March 25, 2022 showed iron level of 71 and ferritin of 22 with a hemoglobin of 11.1.  Clinically, he reports some fatigue and tiredness but he is undergoing rehab for his knee surgery.  He denies any hematochezia, melena or hemoptysis.  He has resumed oral iron therapy twice a day.   He does not report any headaches, blurry vision, syncope or seizures. Does not report any fevers, chills or sweats.  Does not report any cough, wheezing or hemoptysis.  Does not report any chest pain, palpitation, orthopnea or leg edema.  Does not report any nausea, vomiting or abdominal pain.  Does not report any constipation or diarrhea.  Does not report any skeletal complaints.    Does not report frequency, urgency or hematuria.  Does not report any skin rashes or lesions. Does not report any heat or cold intolerance.  Does not report any lymphadenopathy or petechiae.  Does not report any anxiety or depression.  Remaining review of systems is negative.     Past Medical History:  Diagnosis Date   Anemia    Anxiety    Arthritis    Chest pressure    cardiac cath;  minimal non-obs CAD and normal EF 06/2008   Coronary artery disease    Depression    Diabetes mellitus    x5 years  type 2   Family history of anesthesia complication    mother n/v   GERD (gastroesophageal reflux disease)    Glaucoma    no per pt   Headache(784.0)     headaches   Cluster   Heart murmur    slight per pcp that is retired [name]  no issues    History of peptic ulcer disease    Hyperlipidemia    Hypertension    s/p syncopal episode with facial trauma due to orthostasis 10/2008. diuretic held   Hypothyroidism    Obesity    PONV (postoperative nausea and vomiting)    once   Sleep apnea    dont use CPAP   Syncopal episodes    likely due to orthostatic hypotension  :   Past Surgical History:  Procedure Laterality Date   left knee surgery     medial meniscus repair x2   MOUTH SURGERY     to make palate wider   NASAL HEMORRHAGE CONTROL N/A 03/24/2013   Procedure: EPISTAXIS CONTROL;  Surgeon: [name], MD;  Location: [facility] OR;  Service: ENT;  Laterality: N/A;   NASAL SEPTOPLASTY W/ TURBINOPLASTY N/A 03/24/2013   Procedure: NASAL SEPTOPLASTY WITH TURBINATE REDUCTION;  Surgeon: [name], MD;  Location: [facility] OR;  Service: ENT;  Laterality: N/A;   NECK SURGERY     C5-C6   TOTAL KNEE ARTHROPLASTY Right 04/03/2022   Procedure: RIGHT KNEE ARTHROPLASTY;  Surgeon: Kathryne Hitch, MD;  Location: [facility] ORS;  Service: Orthopedics;  Laterality: Right;   VASECTOMY    :   Current Outpatient Medications:    ALPRAZolam (XANAX) 0.5 MG tablet, Take one tablet (0.5 mg dose) by mouth 3 (three) times a day as needed for Sleep or Anxiety., Disp: 30 tablet, Rfl: 0   ALPRAZolam (XANAX) 0.5 MG tablet, Take one tablet (0.5 mg dose) by mouth 3 (three) times a day as needed for Sleep or Anxiety., Disp: 30 tablet, Rfl: 0   amLODipine (NORVASC) 5 MG tablet, Take 1 tablet by mouth daily., Disp: 30 tablet, Rfl: 3   Ascorbic Acid (VITAMIN C PO), Take 1 tablet by mouth daily.,  Disp: , Rfl:    aspirin 81 MG chewable tablet, Chew 1 tablet (81 mg total) by mouth 2 (two) times daily., Disp: 30 tablet, Rfl: 0   carvedilol (COREG) 25 MG tablet, Take 1 tablet (25 mg dose) by mouth 2 (two) times daily with a meal., Disp: 180 tablet, Rfl: 3   doxycycline (VIBRA-TABS) 100 MG tablet, Take 1 tablet by mouth 2 times daily., Disp: 30 tablet, Rfl: 0   fenofibrate 160 MG tablet, Take 1 tablet by mouth daily., Disp: 30 tablet, Rfl: 5   ferrous sulfate 325 (65 FE) MG EC tablet, Take 1 tablet by mouth 2 times daily. (Patient taking differently: Take 650 mg by mouth daily.), Disp: 60 tablet, Rfl: 3   FLUoxetine (PROZAC) 40 MG capsule, Take 1 capsule by mouth daily., Disp: 90 capsule, Rfl: 3   gabapentin (NEURONTIN) 100 MG capsule, Take 1 capsule (100 mg total) by mouth 3 (three) times daily. (Patient taking differently: Take 100 mg by mouth 3 (three) times daily as needed (pain).), Disp: 90 capsule, Rfl: 1   gabapentin (NEURONTIN) 100 MG capsule, Take one capsule (100 mg dose) by mouth 3 (three) times a day., Disp: 90 capsule, Rfl: 2   glimepiride (AMARYL) 4 MG tablet, Take 1 tablet by mouth 2 times daily., Disp: 60 tablet, Rfl: 3   hydrochlorothiazide (HYDRODIURIL) 25 MG tablet, Take one half tablet (12.5 mg dose) by mouth daily., Disp: 90 tablet, Rfl: 3   HYDROcodone-acetaminophen (NORCO) 10-325 MG tablet, Take 1 tablet by mouth every 4 (four) hours as needed., Disp: 30 tablet, Rfl: 0   ibuprofen (ADVIL) 200 MG tablet, Take 800 mg by mouth every 6 (six) hours as needed for moderate pain., Disp: , Rfl:    insulin lispro (HUMALOG KWIKPEN) 100 UNIT/ML KwikPen, Inject 10 Units into the skin 3 times daily with meals. (Patient taking differently: Inject 30 Units into the skin daily.), Disp: 15 mL, Rfl: 3   LANTUS SOLOSTAR 100 UNIT/ML Solostar Pen, INJECT 70 UNITS INTO THE SKIN EVERY MORNING, Disp: 18 mL, Rfl: 11   levothyroxine (SYNTHROID) 50 MCG tablet, Take 1 tablet by mouth daily., Disp: 30  tablet, Rfl: 5   levothyroxine (SYNTHROID) 50 MCG tablet, Take one tablet (50 mcg dose) by mouth daily., Disp: 30 tablet, Rfl: 5   lisinopril (ZESTRIL) 40 MG tablet, TAKE 1 TABLET BY MOUTH ONCE A DAY, Disp: 90 tablet, Rfl: 11   ONETOUCH VERIO test strip, Use to test blood sugar 3 times a day, Disp: 100 each, Rfl: 2   ONETOUCH VERIO test strip, Use to check blood sugar 3 times a  day., Disp: 100 each, Rfl: 2   pantoprazole (PROTONIX) 40 MG tablet, Take 1 tablet (40 mg dose) by mouth daily., Disp: 90 tablet, Rfl: 3   rosuvastatin (CRESTOR) 5 MG tablet, Take 1 tablet by mouth daily., Disp: 30 tablet, Rfl: 3   SitaGLIPtin-MetFORMIN HCl (JANUMET XR) 50-1000 MG TB24, Take one tablet by mouth daily., Disp: 30 tablet, Rfl: 3   SitaGLIPtin-MetFORMIN HCl (JANUMET XR) 50-1000 MG TB24, Take 1 tablet by mouth daily., Disp: 30 tablet, Rfl: 3   tiZANidine (ZANAFLEX) 4 MG tablet, Take 1 tablet (4 mg total) by mouth every 6 (six) hours as needed for muscle spasms., Disp: 30 tablet, Rfl: 0   UNIFINE PENTIPS 31G X 8 MM MISC, USE AS DIRECTED, Disp: 100 each, Rfl: 1   UNIFINE PENTIPS 31G X 8 MM MISC, USE AS DIRECTED, Disp: 100 each, Rfl: 1:   Allergies  Allergen Reactions   Codeine     GI uspet   Prednisone     Makes him "ill"  :   Family History  Problem Relation Age of Onset   Heart disease Mother 1866       CABG   Cancer Father        lung   Heart disease Father [id number]       CABG  :   Social History   Socioeconomic History   Marital status: Married    Spouse name: Not on file   Number of children: 1   Years of education: Not on file   Highest education level: Not on file  Occupational History   Occupation: Works at [employer]  Tobacco Use   Smoking status: Former    Packs/day: 1.00    Years: 10.00    Total pack years: 10.00    Types: Cigarettes    Quit date: 08/10/1982    Years since quitting: 39.7   Smokeless tobacco: Never  Vaping Use   Vaping Use: Never used  Substance and  Sexual Activity   Alcohol use: No   Drug use: No   Sexual activity: Not Currently  Other Topics Concern   Not on file  Social History Narrative   Not on file   Social Determinants of Health   Financial Resource Strain: Not on file  Food Insecurity: Not on file  Transportation Needs: Not on file  Physical Activity: Not on file  Stress: Not on file  Social Connections: Not on file  Intimate Partner Violence: Not on file  :  Pertinent items are noted in HPI.  Exam: Blood pressure (!) 120/49, pulse 60, temperature 97.8 F (36.6 C), temperature source Temporal, resp. rate 18, height 5\' 8"  (1.727 m), weight 256 lb 12.8 oz (116.5 kg), SpO2 98 %. ECOG 1 General appearance: alert and cooperative appeared without distress. Head: atraumatic without any abnormalities. Eyes: conjunctivae/corneas clear. PERRL.  Sclera anicteric. Throat: lips, mucosa, and tongue normal; without oral thrush or ulcers. Resp: clear to auscultation bilaterally without rhonchi, wheezes or dullness to percussion. Cardio: regular rate and rhythm, S1, S2 normal, no murmur, click, rub or gallop GI: soft, non-tender; bowel sounds normal; no masses,  no organomegaly Skin: Skin color, texture, turgor normal. No rashes or lesions Lymph nodes: Cervical, supraclavicular, and axillary nodes normal. Neurologic: Grossly normal without any motor, sensory or deep tendon reflexes. Musculoskeletal: No joint deformity or effusion.  DG Knee Right Port  Result Date: 04/03/2022 CLINICAL DATA:  Provided history: Postop right knee. EXAM: PORTABLE RIGHT KNEE - 1-2 VIEW COMPARISON:  Radiographs of  the right knee 12/31/2021 (images available, report unavailable). FINDINGS: Immediate postoperative changes from right total knee arthroplasty. The femoral and tibial components appear well seated. Overlying soft tissue edema, subcutaneous gas and skin staples. Gas is also present within the joint space. IMPRESSION: Immediate postoperative  changes from right total knee arthroplasty. No unexpected finding on the provided views. Electronically Signed   By: Kellie Simmering D.O.   On: 04/03/2022 14:22    Assessment and Plan:   66 year old with:  1.  Iron deficiency anemia noted in June 2023.  He was found to have iron of 32 and ferritin of 12.  Repeat laboratory testing in August 2023 showed hemoglobin of 11 and improvement in his ferritin up to 22.  He is status post knee replacement in August 2023.  The differential diagnosis of these findings were discussed at this time.  Chronic GI blood losses as well as poor oral iron absorption were discussed.  Treatment options including continued oral iron therapy versus IV iron treatment were discussed.  Risks and benefits of prior IV iron infusion were reiterated.  These complications include nausea, fatigue, arthralgias, infusion related complications and rarely anaphylaxis.  After discussion today, we opted to continue with oral iron therapy given his reasonable response between June and August despite interruption of his oral iron therapy.  He would like to defer IV iron therapy unless his oral iron therapy is not successful.  2.  Follow-up: In 3 months for repeat follow-up.  45  minutes were dedicated to this visit. The time was spent on reviewing laboratory data, discussing treatment options, discussing differential diagnosis and answering questions regarding future plan.     A copy of this consult has been forwarded to the requesting physician.

## 2022-04-30 ENCOUNTER — Other Ambulatory Visit: Payer: Self-pay | Admitting: Orthopaedic Surgery

## 2022-04-30 ENCOUNTER — Other Ambulatory Visit (HOSPITAL_COMMUNITY): Payer: Self-pay

## 2022-05-01 ENCOUNTER — Other Ambulatory Visit (HOSPITAL_COMMUNITY): Payer: Self-pay

## 2022-05-01 ENCOUNTER — Ambulatory Visit: Payer: Medicare Other | Admitting: Physical Therapy

## 2022-05-01 ENCOUNTER — Encounter: Payer: Self-pay | Admitting: Physical Therapy

## 2022-05-01 DIAGNOSIS — R2689 Other abnormalities of gait and mobility: Secondary | ICD-10-CM

## 2022-05-01 DIAGNOSIS — M25561 Pain in right knee: Secondary | ICD-10-CM

## 2022-05-01 DIAGNOSIS — R600 Localized edema: Secondary | ICD-10-CM

## 2022-05-01 DIAGNOSIS — Z96651 Presence of right artificial knee joint: Secondary | ICD-10-CM | POA: Diagnosis not present

## 2022-05-01 MED ORDER — TIZANIDINE HCL 4 MG PO TABS
4.0000 mg | ORAL_TABLET | Freq: Four times a day (QID) | ORAL | 0 refills | Status: DC | PRN
  Filled 2022-05-01: qty 30, 8d supply, fill #0

## 2022-05-01 NOTE — Therapy (Signed)
OUTPATIENT PHYSICAL THERAPY TREATMENT NOTE   Patient Name: Tony Morrison MRN: [id number] DOB:07/10/1956, 66 y.o., male Today's Date: 05/01/2022  PCP:  Badger, Michael, MD           REFERRING PROVIDER: Blackman, Christopher, MD    END OF SESSION:   PT End of Session - 05/01/22 0751     Visit Number 2    Date for PT Re-Evaluation 06/24/22    Authorization Type [employer] Medicare    Progress Note Due on Visit 10    PT Start Time 0753    PT Stop Time 0846    PT Time Calculation (min) 53 min    Activity Tolerance Patient tolerated treatment well    Behavior During Therapy WFL for tasks assessed/performed             Past Medical History:  Diagnosis Date   Anemia    Anxiety    Arthritis    Chest pressure    cardiac cath; minimal non-obs CAD and normal EF 06/2008   Coronary artery disease    Depression    Diabetes mellitus    x5 years  type 2   Family history of anesthesia complication    mother n/v   GERD (gastroesophageal reflux disease)    Glaucoma    no per pt   Headache(784.0)     headaches   Cluster   Heart murmur    slight per pcp that is retired [employer]  no issues    History of peptic ulcer disease    Hyperlipidemia    Hypertension    s/p syncopal episode with facial trauma due to orthostasis 10/2008. diuretic held   Hypothyroidism    Obesity    PONV (postoperative nausea and vomiting)    once   Sleep apnea    dont use CPAP   Syncopal episodes    likely due to orthostatic hypotension   Past Surgical History:  Procedure Laterality Date   left knee surgery     medial meniscus repair x2   MOUTH SURGERY     to make palate wider   NASAL HEMORRHAGE CONTROL N/A 03/24/2013   Procedure: EPISTAXIS CONTROL;  Surgeon: Karol Wolicki, MD;  Location: [facility];  Service: ENT;  Laterality: N/A;   NASAL SEPTOPLASTY W/ TURBINOPLASTY N/A 03/24/2013   Procedure: NASAL SEPTOPLASTY WITH TURBINATE REDUCTION;  Surgeon: Karol Wolicki, MD;  Location: [facility];  Service:  ENT;  Laterality: N/A;   NECK SURGERY     C5-C6   TOTAL KNEE ARTHROPLASTY Right 04/03/2022   Procedure: RIGHT KNEE ARTHROPLASTY;  Surgeon: Blackman, Christopher Y, MD;  Location: [facility];  Service: Orthopedics;  Laterality: Right;   VASECTOMY     Patient Active Problem List   Diagnosis Date Noted   Status post total right knee replacement 04/03/2022   Unilateral primary osteoarthritis, right knee 11/23/2019   OBESITY-MORBID (>100') 07/30/2009   SYNCOPE AND COLLAPSE 10/29/2008   OBSTRUCTIVE SLEEP APNEA 09/28/2008   HYPERLIPIDEMIA-MIXED 07/12/2008   HYPERTENSION, BENIGN 07/12/2008   CHEST PAIN-UNSPECIFIED 07/12/2008    REFERRING DIAG: s/p Rt TKA  THERAPY DIAG:  Acute pain of right knee  Localized edema  Other abnormalities of gait and mobility  Rationale for Evaluation and Treatment Rehabilitation  PERTINENT HISTORY: S/p Rt TKA (04/03/22), COPD, hypothyroid, anemia    PRECAUTIONS: None  SUBJECTIVE: I was quite sore after the evaluation.   PAIN:  Are you having pain?  A stiff soreness   OBJECTIVE: (objective measures completed at initial   OUTPATIENT PHYSICAL THERAPY TREATMENT NOTE   Patient Name: Tony Morrison MRN: [id number] DOB:07/10/1956, 66 y.o., male Today's Date: 05/01/2022  PCP:  Badger, Michael, MD           REFERRING PROVIDER: Blackman, Christopher, MD    END OF SESSION:   PT End of Session - 05/01/22 0751     Visit Number 2    Date for PT Re-Evaluation 06/24/22    Authorization Type [employer] Medicare    Progress Note Due on Visit 10    PT Start Time 0753    PT Stop Time 0846    PT Time Calculation (min) 53 min    Activity Tolerance Patient tolerated treatment well    Behavior During Therapy WFL for tasks assessed/performed             Past Medical History:  Diagnosis Date   Anemia    Anxiety    Arthritis    Chest pressure    cardiac cath; minimal non-obs CAD and normal EF 06/2008   Coronary artery disease    Depression    Diabetes mellitus    x5 years  type 2   Family history of anesthesia complication    mother n/v   GERD (gastroesophageal reflux disease)    Glaucoma    no per pt   Headache(784.0)     headaches   Cluster   Heart murmur    slight per pcp that is retired [employer]  no issues    History of peptic ulcer disease    Hyperlipidemia    Hypertension    s/p syncopal episode with facial trauma due to orthostasis 10/2008. diuretic held   Hypothyroidism    Obesity    PONV (postoperative nausea and vomiting)    once   Sleep apnea    dont use CPAP   Syncopal episodes    likely due to orthostatic hypotension   Past Surgical History:  Procedure Laterality Date   left knee surgery     medial meniscus repair x2   MOUTH SURGERY     to make palate wider   NASAL HEMORRHAGE CONTROL N/A 03/24/2013   Procedure: EPISTAXIS CONTROL;  Surgeon: Karol Wolicki, MD;  Location: [facility];  Service: ENT;  Laterality: N/A;   NASAL SEPTOPLASTY W/ TURBINOPLASTY N/A 03/24/2013   Procedure: NASAL SEPTOPLASTY WITH TURBINATE REDUCTION;  Surgeon: Karol Wolicki, MD;  Location: [facility];  Service:  ENT;  Laterality: N/A;   NECK SURGERY     C5-C6   TOTAL KNEE ARTHROPLASTY Right 04/03/2022   Procedure: RIGHT KNEE ARTHROPLASTY;  Surgeon: Blackman, Christopher Y, MD;  Location: [facility];  Service: Orthopedics;  Laterality: Right;   VASECTOMY     Patient Active Problem List   Diagnosis Date Noted   Status post total right knee replacement 04/03/2022   Unilateral primary osteoarthritis, right knee 11/23/2019   OBESITY-MORBID (>100') 07/30/2009   SYNCOPE AND COLLAPSE 10/29/2008   OBSTRUCTIVE SLEEP APNEA 09/28/2008   HYPERLIPIDEMIA-MIXED 07/12/2008   HYPERTENSION, BENIGN 07/12/2008   CHEST PAIN-UNSPECIFIED 07/12/2008    REFERRING DIAG: s/p Rt TKA  THERAPY DIAG:  Acute pain of right knee  Localized edema  Other abnormalities of gait and mobility  Rationale for Evaluation and Treatment Rehabilitation  PERTINENT HISTORY: S/p Rt TKA (04/03/22), COPD, hypothyroid, anemia    PRECAUTIONS: None  SUBJECTIVE: I was quite sore after the evaluation.   PAIN:  Are you having pain?  A stiff soreness   OBJECTIVE: (objective measures completed at initial   OUTPATIENT PHYSICAL THERAPY TREATMENT NOTE   Patient Name: Tony Morrison MRN: [id number] DOB:07/10/1956, 66 y.o., male Today's Date: 05/01/2022  PCP:  Badger, Michael, MD           REFERRING PROVIDER: Blackman, Christopher, MD    END OF SESSION:   PT End of Session - 05/01/22 0751     Visit Number 2    Date for PT Re-Evaluation 06/24/22    Authorization Type [employer] Medicare    Progress Note Due on Visit 10    PT Start Time 0753    PT Stop Time 0846    PT Time Calculation (min) 53 min    Activity Tolerance Patient tolerated treatment well    Behavior During Therapy WFL for tasks assessed/performed             Past Medical History:  Diagnosis Date   Anemia    Anxiety    Arthritis    Chest pressure    cardiac cath; minimal non-obs CAD and normal EF 06/2008   Coronary artery disease    Depression    Diabetes mellitus    x5 years  type 2   Family history of anesthesia complication    mother n/v   GERD (gastroesophageal reflux disease)    Glaucoma    no per pt   Headache(784.0)     headaches   Cluster   Heart murmur    slight per pcp that is retired [employer]  no issues    History of peptic ulcer disease    Hyperlipidemia    Hypertension    s/p syncopal episode with facial trauma due to orthostasis 10/2008. diuretic held   Hypothyroidism    Obesity    PONV (postoperative nausea and vomiting)    once   Sleep apnea    dont use CPAP   Syncopal episodes    likely due to orthostatic hypotension   Past Surgical History:  Procedure Laterality Date   left knee surgery     medial meniscus repair x2   MOUTH SURGERY     to make palate wider   NASAL HEMORRHAGE CONTROL N/A 03/24/2013   Procedure: EPISTAXIS CONTROL;  Surgeon: Karol Wolicki, MD;  Location: [facility];  Service: ENT;  Laterality: N/A;   NASAL SEPTOPLASTY W/ TURBINOPLASTY N/A 03/24/2013   Procedure: NASAL SEPTOPLASTY WITH TURBINATE REDUCTION;  Surgeon: Karol Wolicki, MD;  Location: [facility];  Service:  ENT;  Laterality: N/A;   NECK SURGERY     C5-C6   TOTAL KNEE ARTHROPLASTY Right 04/03/2022   Procedure: RIGHT KNEE ARTHROPLASTY;  Surgeon: Blackman, Christopher Y, MD;  Location: [facility];  Service: Orthopedics;  Laterality: Right;   VASECTOMY     Patient Active Problem List   Diagnosis Date Noted   Status post total right knee replacement 04/03/2022   Unilateral primary osteoarthritis, right knee 11/23/2019   OBESITY-MORBID (>100') 07/30/2009   SYNCOPE AND COLLAPSE 10/29/2008   OBSTRUCTIVE SLEEP APNEA 09/28/2008   HYPERLIPIDEMIA-MIXED 07/12/2008   HYPERTENSION, BENIGN 07/12/2008   CHEST PAIN-UNSPECIFIED 07/12/2008    REFERRING DIAG: s/p Rt TKA  THERAPY DIAG:  Acute pain of right knee  Localized edema  Other abnormalities of gait and mobility  Rationale for Evaluation and Treatment Rehabilitation  PERTINENT HISTORY: S/p Rt TKA (04/03/22), COPD, hypothyroid, anemia    PRECAUTIONS: None  SUBJECTIVE: I was quite sore after the evaluation.   PAIN:  Are you having pain?  A stiff soreness   OBJECTIVE: (objective measures completed at initial

## 2022-05-02 ENCOUNTER — Other Ambulatory Visit (HOSPITAL_COMMUNITY): Payer: Self-pay

## 2022-05-04 ENCOUNTER — Other Ambulatory Visit (HOSPITAL_COMMUNITY): Payer: Self-pay

## 2022-05-04 MED ORDER — INSULIN LISPRO (1 UNIT DIAL) 100 UNIT/ML (KWIKPEN)
10.0000 [IU] | PEN_INJECTOR | Freq: Three times a day (TID) | SUBCUTANEOUS | 3 refills | Status: DC
  Filled 2022-05-04: qty 15, 50d supply, fill #0
  Filled 2022-06-18: qty 15, 50d supply, fill #1
  Filled 2022-08-14: qty 15, 50d supply, fill #2
  Filled 2022-09-30 – 2022-10-01 (×2): qty 15, 50d supply, fill #3

## 2022-05-05 ENCOUNTER — Other Ambulatory Visit (HOSPITAL_COMMUNITY): Payer: Self-pay

## 2022-05-05 ENCOUNTER — Ambulatory Visit: Payer: Medicare Other

## 2022-05-05 ENCOUNTER — Other Ambulatory Visit: Payer: Self-pay | Admitting: Orthopaedic Surgery

## 2022-05-05 MED ORDER — HYDROCODONE-ACETAMINOPHEN 10-325 MG PO TABS
1.0000 | ORAL_TABLET | ORAL | 0 refills | Status: DC | PRN
  Filled 2022-05-05: qty 30, 5d supply, fill #0

## 2022-05-07 ENCOUNTER — Ambulatory Visit: Payer: Medicare Other

## 2022-05-07 DIAGNOSIS — Z96651 Presence of right artificial knee joint: Secondary | ICD-10-CM | POA: Diagnosis not present

## 2022-05-07 DIAGNOSIS — R600 Localized edema: Secondary | ICD-10-CM

## 2022-05-07 DIAGNOSIS — M25561 Pain in right knee: Secondary | ICD-10-CM

## 2022-05-07 DIAGNOSIS — R2689 Other abnormalities of gait and mobility: Secondary | ICD-10-CM

## 2022-05-07 NOTE — Therapy (Signed)
OUTPATIENT PHYSICAL THERAPY TREATMENT NOTE   Patient Name: Tony Morrison MRN: [id number] DOB:May 01, 1956, 66 y.o., male Today's Date: 05/07/2022  PCP:  Antony Haste, MD           REFERRING PROVIDER: Doneen Poisson, MD    END OF SESSION:   PT End of Session - 05/07/22 1230     Visit Number 3    Date for PT Re-Evaluation 06/24/22    Authorization Type [employer] Medicare    Progress Note Due on Visit 10    PT Start Time 1147    PT Stop Time 1239    PT Time Calculation (min) 52 min    Activity Tolerance Patient tolerated treatment well    Behavior During Therapy [facility] for tasks assessed/performed              Past Medical History:  Diagnosis Date   Anemia    Anxiety    Arthritis    Chest pressure    cardiac cath; minimal non-obs CAD and normal EF 06/2008   Coronary artery disease    Depression    Diabetes mellitus    x5 years  type 2   Family history of anesthesia complication    mother n/v   GERD (gastroesophageal reflux disease)    Glaucoma    no per pt   Headache(784.0)     headaches   Cluster   Heart murmur    slight per pcp that is retired [name]  no issues    History of peptic ulcer disease    Hyperlipidemia    Hypertension    s/p syncopal episode with facial trauma due to orthostasis 10/2008. diuretic held   Hypothyroidism    Obesity    PONV (postoperative nausea and vomiting)    once   Sleep apnea    dont use CPAP   Syncopal episodes    likely due to orthostatic hypotension   Past Surgical History:  Procedure Laterality Date   left knee surgery     medial meniscus repair x2   MOUTH SURGERY     to make palate wider   NASAL HEMORRHAGE CONTROL N/A 03/24/2013   Procedure: EPISTAXIS CONTROL;  Surgeon: [name], MD;  Location: [facility] OR;  Service: ENT;  Laterality: N/A;   NASAL SEPTOPLASTY W/ TURBINOPLASTY N/A 03/24/2013   Procedure: NASAL SEPTOPLASTY WITH TURBINATE REDUCTION;  Surgeon: [name], MD;  Location: [facility] OR;  Service:  ENT;  Laterality: N/A;   NECK SURGERY     C5-C6   TOTAL KNEE ARTHROPLASTY Right 04/03/2022   Procedure: RIGHT KNEE ARTHROPLASTY;  Surgeon: Kathryne Hitch, MD;  Location: [facility];  Service: Orthopedics;  Laterality: Right;   VASECTOMY     Patient Active Problem List   Diagnosis Date Noted   Status post total right knee replacement 04/03/2022   Unilateral primary osteoarthritis, right knee 11/23/2019   OBESITY-MORBID (>100') 07/30/2009   SYNCOPE AND COLLAPSE 10/29/2008   OBSTRUCTIVE SLEEP APNEA 09/28/2008   HYPERLIPIDEMIA-MIXED 07/12/2008   HYPERTENSION, BENIGN 07/12/2008   CHEST PAIN-UNSPECIFIED 07/12/2008    REFERRING DIAG: s/p Rt TKA  THERAPY DIAG:  Acute pain of right knee  Localized edema  Other abnormalities of gait and mobility  Rationale for Evaluation and Treatment Rehabilitation  PERTINENT HISTORY: S/p Rt TKA (04/03/22), COPD, hypothyroid, anemia    PRECAUTIONS: None  SUBJECTIVE: I have been doing my exercises.  My pain was high over the past 2 days.  I took pain meds and feel better now.  PAIN:  Are you having pain?  A stiff soreness No pain today   OBJECTIVE: (objective measures completed at initial evaluation unless otherwise dated)  DIAGNOSTIC FINDINGS: NA   PATIENT SURVEYS:  FOTO 13 (goal is 37)   COGNITION:           Overall cognitive status: Within functional limits for tasks assessed                          SENSATION: WFL   EDEMA:  Circumferential: Lt mid patellar: 17 inches, Rt 18.25 inches    MUSCLE LENGTH: Hamstring flexibility limited on Rt due to quad lad   POSTURE: rounded shoulders and forward head   PALPATION: Reduced scar mobility over distal 1/2 of scar.  Skin is dry and peeling around incision.  Edema and pain bilaterally.    LOWER EXTREMITY ROM:   Active ROM Right eval Left eval Right 05/01/22  Hip flexion Limited by 25%     Hip extension       Hip abduction       Hip adduction       Hip internal rotation        Hip external rotation       Knee flexion 105   118  Knee extension 8     Ankle dorsiflexion full     Ankle plantarflexion       Ankle inversion       Ankle eversion        (Blank rows = not tested)   LOWER EXTREMITY MMT:   MMT Right eval Left eval  Hip flexion 4    Hip extension      Hip abduction      Hip adduction 4+    Hip internal rotation      Hip external rotation      Knee flexion 4    Knee extension 4 with lag    Ankle dorsiflexion 4+    Ankle plantarflexion      Ankle inversion      Ankle eversion       (Blank rows = not tested)                       Lt hip and knee: 4+/5 GAIT: Distance walked: 50 Assistive device utilized: None Level of assistance: Modified independence Comments: ER at Rt hip, antalgic with reduced time spent in stance on Rt and reduced heel strike    TODAY'S TREATMENT: 05/07/22: Nustep L1 x 8 min with PT present to discuss status Long arc quads: 2# added 2x10- verbal cues for quad activation Seated flexion with foot on slider Heel raises 2x10 VC to contract quads Standing knee flexion 2# added 2x10 Standing hip abduction 2x10 2# added-tactile and verbal cues to reduce trunk rotation Rockerboard standing x3 min 4" step-ups: x15 on Rt Weight shifting on balance pad 3 ways: 1 min each Manual: elongation and release to distal Rt quads and hamstrings, scar massage Game Ready 10 min 3 flakes Rt knee    05/01/22: Nustep L1 x 7 min with PTA present to discuss status Seated flexion with foot on slider Heel raises 2x10 VC to contract quads Standing knee flexion 2x10 Standing hip abduction 210 Single leg stance with LT toe taps on second step 10x UE on rails for balance Supine blue loop clamshells 2x10 Review of scar massage technique Game Ready 10 min 3 flakes RT knee    Date:  04/29/22 Verbal review of HEP issued by home health PT Scar massage instruction NuStep: Level 1x 5 min Game Ready: 3 snowflakes, med compression x 10 min      PATIENT EDUCATION:  Education details: scar massage, review of HEP issued by home health Person educated: Patient Education method: Explanation, Demonstration, and Handouts Education comprehension: verbalized understanding and returned demonstration     HOME EXERCISE PROGRAM: Review of HEP issued by home health PT   ASSESSMENT:   CLINICAL IMPRESSION: Pt is doing his exercises and icing to control edema.  Pt tolerated advancement of exercise with addition of ankle weight today.  Pt required minor tactile cues for quad activation and glute activation in standing.  Max cueing for alignment with standing hip abduction. Pt with tension in distal quads and hamstrings and had improved tissue mobility after manual therapy today.  Edema consistent with post-op knee replacement and pt responds well to Game Ready.  Patient will benefit from skilled PT to address the below impairments and improve overall function.    OBJECTIVE IMPAIRMENTS decreased activity tolerance, decreased balance, decreased endurance, difficulty walking, decreased ROM, decreased strength, hypomobility, increased edema, impaired flexibility, and pain.    ACTIVITY LIMITATIONS lifting, bending, squatting, stairs, transfers, and locomotion level   PARTICIPATION LIMITATIONS: meal prep, driving, shopping, community activity, and yard work   PERSONAL FACTORS 1 comorbidity: s/p TKA  are also affecting patient's functional outcome.    REHAB POTENTIAL: Excellent   CLINICAL DECISION MAKING: Stable/uncomplicated   EVALUATION COMPLEXITY: Low     GOALS: Goals reviewed with patient? Yes   SHORT TERM GOALS: Target date: 05/27/2022  Be independent in initial HEP Baseline: Goal status: Goal met 05/01/22   2.  Improve Rt LE strength to ascend steps with step-over-step gait pattern and use of 1 rail Baseline: worked on step-ups today Goal status: in progress    3.  Demonstrate symmetry with gait on level surfaces without device   Baseline: antalgia without device  Goal status: INITIAL   4.  Reduce Rt knee pain to allow for standing > or = to 25 minutes without limitation  Baseline: 15 minutes  Goal status: INITIAL   5.  Demonstrate > or = to 110 Rt knee flexion to improve squatting and negotiating steps  Baseline: 105 Goal status: INITIAL       LONG TERM GOALS: Target date: 06/24/2022    Be independent in advanced HEP Baseline:  Goal status: INITIAL   2.  Improve FOTO to > or = to 75 Baseline: 59 Goal status: INITIAL   3.  Reduce Rt knee pain and improve endurance to stand and walk > or = to 30-45 minutes for community activity. Baseline: 15 min Goal status: INITIAL   4.  Improve Rt knee strength and ROM to negotiate steps with step-over-step gait and use of 1 rail Baseline:  Goal status: INITIAL   5.  Demonstrate > or = to  115-120 degrees of Rt knee flexion to squat for home tasks  Baseline: 105 Goal status: INITIAL       PLAN: PT FREQUENCY: 2x/week   PT DURATION: 8 weeks   PLANNED INTERVENTIONS: Therapeutic exercises, Therapeutic activity, Neuromuscular re-education, Balance training, Gait training, Patient/Family education, Self Care, Joint mobilization, Stair training, Aquatic Therapy, Dry Needling, Electrical stimulation, Cryotherapy, Moist heat, scar mobilization, Taping, Vasopneumatic device, Manual therapy, and Re-evaluation   PLAN FOR NEXT SESSION: Begin step ups, manual, strength, flexibility and edema control   Lorrene Reid, PT 05/07/22 12:31 PM  [facility] [address], [address] [place], [place] [address] Phone # [phone] Fax [phone]

## 2022-05-11 ENCOUNTER — Ambulatory Visit: Payer: Medicare Other | Attending: Orthopaedic Surgery

## 2022-05-11 ENCOUNTER — Other Ambulatory Visit (HOSPITAL_BASED_OUTPATIENT_CLINIC_OR_DEPARTMENT_OTHER): Payer: Self-pay

## 2022-05-11 DIAGNOSIS — R600 Localized edema: Secondary | ICD-10-CM | POA: Diagnosis present

## 2022-05-11 DIAGNOSIS — M25561 Pain in right knee: Secondary | ICD-10-CM | POA: Insufficient documentation

## 2022-05-11 DIAGNOSIS — R2689 Other abnormalities of gait and mobility: Secondary | ICD-10-CM | POA: Insufficient documentation

## 2022-05-11 MED ORDER — INFLUENZA VAC A&B SA ADJ QUAD 0.5 ML IM PRSY
PREFILLED_SYRINGE | INTRAMUSCULAR | 0 refills | Status: DC
  Filled 2022-05-11: qty 0.5, 1d supply, fill #0

## 2022-05-11 NOTE — Therapy (Signed)
OUTPATIENT PHYSICAL THERAPY TREATMENT NOTE   Patient Name: Tony Morrison MRN: [id number] DOB:May 25, 1956, 66 y.o., male Today's Date: 05/11/2022  PCP:  Antony Haste, MD           REFERRING PROVIDER: Doneen Poisson, MD    END OF SESSION:   PT End of Session - 05/11/22 1015     Visit Number 4    Date for PT Re-Evaluation 06/24/22    Authorization Type [employer] Medicare    Progress Note Due on Visit 10    PT Start Time 0931    PT Stop Time 1026    PT Time Calculation (min) 55 min    Activity Tolerance Patient tolerated treatment well    Behavior During Therapy [facility] for tasks assessed/performed               Past Medical History:  Diagnosis Date   Anemia    Anxiety    Arthritis    Chest pressure    cardiac cath; minimal non-obs CAD and normal EF 06/2008   Coronary artery disease    Depression    Diabetes mellitus    x5 years  type 2   Family history of anesthesia complication    mother n/v   GERD (gastroesophageal reflux disease)    Glaucoma    no per pt   Headache(784.0)     headaches   Cluster   Heart murmur    slight per pcp that is retired [name]  no issues    History of peptic ulcer disease    Hyperlipidemia    Hypertension    s/p syncopal episode with facial trauma due to orthostasis 10/2008. diuretic held   Hypothyroidism    Obesity    PONV (postoperative nausea and vomiting)    once   Sleep apnea    dont use CPAP   Syncopal episodes    likely due to orthostatic hypotension   Past Surgical History:  Procedure Laterality Date   left knee surgery     medial meniscus repair x2   MOUTH SURGERY     to make palate wider   NASAL HEMORRHAGE CONTROL N/A 03/24/2013   Procedure: EPISTAXIS CONTROL;  Surgeon: [name], MD;  Location: [facility] OR;  Service: ENT;  Laterality: N/A;   NASAL SEPTOPLASTY W/ TURBINOPLASTY N/A 03/24/2013   Procedure: NASAL SEPTOPLASTY WITH TURBINATE REDUCTION;  Surgeon: [name], MD;  Location: [facility] OR;  Service:  ENT;  Laterality: N/A;   NECK SURGERY     C5-C6   TOTAL KNEE ARTHROPLASTY Right 04/03/2022   Procedure: RIGHT KNEE ARTHROPLASTY;  Surgeon: Kathryne Hitch, MD;  Location: [facility] ORS;  Service: Orthopedics;  Laterality: Right;   VASECTOMY     Patient Active Problem List   Diagnosis Date Noted   Status post total right knee replacement 04/03/2022   Unilateral primary osteoarthritis, right knee 11/23/2019   OBESITY-MORBID (>100') 07/30/2009   SYNCOPE AND COLLAPSE 10/29/2008   OBSTRUCTIVE SLEEP APNEA 09/28/2008   HYPERLIPIDEMIA-MIXED 07/12/2008   HYPERTENSION, BENIGN 07/12/2008   CHEST PAIN-UNSPECIFIED 07/12/2008    REFERRING DIAG: s/p Rt TKA  THERAPY DIAG:  Acute pain of right knee  Localized edema  Other abnormalities of gait and mobility  Rationale for Evaluation and Treatment Rehabilitation  PERTINENT HISTORY: S/p Rt TKA (04/03/22), COPD, hypothyroid, anemia    PRECAUTIONS: None  SUBJECTIVE: My knee is stiff and sore this morning.   PAIN:  PAIN:  Are you having pain? Yes NPRS scale: 3-5/10 Pain location: Rt  knee  Pain orientation: Right  PAIN TYPE: aching and ore  Pain description: intermittent  Aggravating factors: standing, being too still, sometimes random Relieving factors: ice, stretching, rest    OBJECTIVE: (objective measures completed at initial evaluation unless otherwise dated)  DIAGNOSTIC FINDINGS: NA   PATIENT SURVEYS:  FOTO 34 (goal is 26)   COGNITION:           Overall cognitive status: Within functional limits for tasks assessed                          SENSATION: WFL   EDEMA:  Circumferential: Lt mid patellar: 17 inches, Rt 18.25 inches    MUSCLE LENGTH: Hamstring flexibility limited on Rt due to quad lad   POSTURE: rounded shoulders and forward head   PALPATION: Reduced scar mobility over distal 1/2 of scar.  Skin is dry and peeling around incision.  Edema and pain bilaterally.    LOWER EXTREMITY ROM:   Active ROM  Right eval Left eval Right 05/01/22  Hip flexion Limited by 25%     Hip extension       Hip abduction       Hip adduction       Hip internal rotation       Hip external rotation       Knee flexion 105   118  Knee extension 8     Ankle dorsiflexion full     Ankle plantarflexion       Ankle inversion       Ankle eversion        (Blank rows = not tested)   LOWER EXTREMITY MMT:   MMT Right eval Left eval  Hip flexion 4    Hip extension      Hip abduction      Hip adduction 4+    Hip internal rotation      Hip external rotation      Knee flexion 4    Knee extension 4 with lag    Ankle dorsiflexion 4+    Ankle plantarflexion      Ankle inversion      Ankle eversion       (Blank rows = not tested)                       Lt hip and knee: 4+/5 GAIT: Distance walked: 50 Assistive device utilized: None Level of assistance: Modified independence Comments: ER at Rt hip, antalgic with reduced time spent in stance on Rt and reduced heel strike    TODAY'S TREATMENT: 05/11/22: Nustep L1 x 9 min with PT present to discuss status Long arc quads: 2# added 2x10- verbal cues for quad activation SLR x10, 2# added x10 Heel raises 2x10 VC to contract quads Standing knee flexion 2# added 2x10 Standing hip abduction 2x10 2# added-tactile and verbal cues to reduce trunk rotation Rockerboard standing x3 min 4" step-ups: x15 on Rt Weight shifting on balance pad 3 ways: 1 min each Manual: elongation and release to distal Rt quads and hamstrings, scar massage Game Ready 10 min 3 flakes Rt knee  05/07/22: Nustep L1 x 8 min with PT present to discuss status Long arc quads: 2# added 2x10- verbal cues for quad activation Seated flexion with foot on slider Heel raises 2x10 VC to contract quads Standing knee flexion 2# added 2x10 Standing hip abduction 2x10 2# added-tactile and verbal cues to reduce trunk rotation Rockerboard standing  x3 min 4" step-ups: x15 on Rt Weight shifting on balance  pad 3 ways: 1 min each Manual: elongation and release to distal Rt quads and hamstrings, scar massage Game Ready 10 min 3 flakes Rt knee    05/01/22: Nustep L1 x 7 min with PTA present to discuss status Seated flexion with foot on slider Heel raises 2x10 VC to contract quads Standing knee flexion 2x10 Standing hip abduction 210 Single leg stance with LT toe taps on second step 10x UE on rails for balance Supine blue loop clamshells 2x10 Review of scar massage technique Game Ready 10 min 3 flakes RT knee      PATIENT EDUCATION:  Education details: scar massage, review of HEP issued by home health Person educated: Patient Education method: Explanation, Demonstration, and Handouts Education comprehension: verbalized understanding and returned demonstration     HOME EXERCISE PROGRAM: Review of HEP issued by home health PT   ASSESSMENT:   CLINICAL IMPRESSION: Pt reports that he has had some increased Rt knee pain and stiffness over the past few days. Pt has consistently been performing HEP for flexibility and strength at home.  Gait is mildly antalgic without device.  Edema consistent with post-op knee replacement and pt responds well to Game Ready.  Patient will benefit from skilled PT to address the below impairments and improve overall function.    OBJECTIVE IMPAIRMENTS decreased activity tolerance, decreased balance, decreased endurance, difficulty walking, decreased ROM, decreased strength, hypomobility, increased edema, impaired flexibility, and pain.    ACTIVITY LIMITATIONS lifting, bending, squatting, stairs, transfers, and locomotion level   PARTICIPATION LIMITATIONS: meal prep, driving, shopping, community activity, and yard work   PERSONAL FACTORS 1 comorbidity: s/p TKA  are also affecting patient's functional outcome.    REHAB POTENTIAL: Excellent   CLINICAL DECISION MAKING: Stable/uncomplicated   EVALUATION COMPLEXITY: Low     GOALS: Goals reviewed with patient?  Yes   SHORT TERM GOALS: Target date: 05/27/2022  Be independent in initial HEP Baseline: Goal status: Goal met 05/01/22   2.  Improve Rt LE strength to ascend steps with step-over-step gait pattern and use of 1 rail Baseline: worked on step-ups today Goal status: in progress    3.  Demonstrate symmetry with gait on level surfaces without device  Baseline: antalgia without device  Goal status: In progress    4.  Reduce Rt knee pain to allow for standing > or = to 25 minutes without limitation  Baseline: 15 minutes  Goal status: INITIAL   5.  Demonstrate > or = to 110 Rt knee flexion to improve squatting and negotiating steps  Baseline: 105 Goal status: INITIAL       LONG TERM GOALS: Target date: 06/24/2022    Be independent in advanced HEP Baseline:  Goal status: INITIAL   2.  Improve FOTO to > or = to 75 Baseline: 59 Goal status: INITIAL   3.  Reduce Rt knee pain and improve endurance to stand and walk > or = to 30-45 minutes for community activity. Baseline: 15 min Goal status: INITIAL   4.  Improve Rt knee strength and ROM to negotiate steps with step-over-step gait and use of 1 rail Baseline:  Goal status: INITIAL   5.  Demonstrate > or = to  115-120 degrees of Rt knee flexion to squat for home tasks  Baseline: 105 Goal status: INITIAL       PLAN: PT FREQUENCY: 2x/week   PT DURATION: 8 weeks   PLANNED INTERVENTIONS: Therapeutic exercises,  Therapeutic activity, Neuromuscular re-education, Balance training, Gait training, Patient/Family education, Self Care, Joint mobilization, Stair training, Aquatic Therapy, Dry Needling, Electrical stimulation, Cryotherapy, Moist heat, scar mobilization, Taping, Vasopneumatic device, Manual therapy, and Re-evaluation   PLAN FOR NEXT SESSION: manual, strength, flexibility and edema control.  Work on gait.     Lorrene Reid, PT 05/11/22 10:16 AM   [facility] [address] [place], [place] [address] Phone # [phone] Fax [phone]

## 2022-05-12 ENCOUNTER — Other Ambulatory Visit (HOSPITAL_COMMUNITY): Payer: Self-pay

## 2022-05-13 ENCOUNTER — Encounter: Payer: Self-pay | Admitting: Orthopaedic Surgery

## 2022-05-13 ENCOUNTER — Other Ambulatory Visit (HOSPITAL_COMMUNITY): Payer: Self-pay

## 2022-05-13 ENCOUNTER — Ambulatory Visit: Payer: Medicare Other

## 2022-05-13 ENCOUNTER — Other Ambulatory Visit: Payer: Self-pay | Admitting: Orthopaedic Surgery

## 2022-05-13 DIAGNOSIS — M25561 Pain in right knee: Secondary | ICD-10-CM | POA: Diagnosis not present

## 2022-05-13 DIAGNOSIS — R600 Localized edema: Secondary | ICD-10-CM

## 2022-05-13 DIAGNOSIS — R2689 Other abnormalities of gait and mobility: Secondary | ICD-10-CM

## 2022-05-13 NOTE — Therapy (Signed)
OUTPATIENT PHYSICAL THERAPY TREATMENT NOTE   Patient Name: Tony Morrison MRN: [id number] DOB:02-16-1956, 66 y.o., male Today's Date: 05/13/2022  PCP:  Antony Haste, MD           REFERRING PROVIDER: Doneen Poisson, MD    END OF SESSION:   PT End of Session - 05/13/22 1101     Visit Number 5    Date for PT Re-Evaluation 06/24/22    Authorization Type [employer] Medicare    Progress Note Due on Visit 10    PT Start Time 1016    PT Stop Time 1110    PT Time Calculation (min) 54 min    Activity Tolerance Patient tolerated treatment well    Behavior During Therapy [facility] for tasks assessed/performed                Past Medical History:  Diagnosis Date   Anemia    Anxiety    Arthritis    Chest pressure    cardiac cath; minimal non-obs CAD and normal EF 06/2008   Coronary artery disease    Depression    Diabetes mellitus    x5 years  type 2   Family history of anesthesia complication    mother n/v   GERD (gastroesophageal reflux disease)    Glaucoma    no per pt   Headache(784.0)     headaches   Cluster   Heart murmur    slight per pcp that is retired [name]  no issues    History of peptic ulcer disease    Hyperlipidemia    Hypertension    s/p syncopal episode with facial trauma due to orthostasis 10/2008. diuretic held   Hypothyroidism    Obesity    PONV (postoperative nausea and vomiting)    once   Sleep apnea    dont use CPAP   Syncopal episodes    likely due to orthostatic hypotension   Past Surgical History:  Procedure Laterality Date   left knee surgery     medial meniscus repair x2   MOUTH SURGERY     to make palate wider   NASAL HEMORRHAGE CONTROL N/A 03/24/2013   Procedure: EPISTAXIS CONTROL;  Surgeon: [name], MD;  Location: [facility] OR;  Service: ENT;  Laterality: N/A;   NASAL SEPTOPLASTY W/ TURBINOPLASTY N/A 03/24/2013   Procedure: NASAL SEPTOPLASTY WITH TURBINATE REDUCTION;  Surgeon: [name], MD;  Location: [facility] OR;   Service: ENT;  Laterality: N/A;   NECK SURGERY     C5-C6   TOTAL KNEE ARTHROPLASTY Right 04/03/2022   Procedure: RIGHT KNEE ARTHROPLASTY;  Surgeon: Kathryne Hitch, MD;  Location: [facility] ORS;  Service: Orthopedics;  Laterality: Right;   VASECTOMY     Patient Active Problem List   Diagnosis Date Noted   Status post total right knee replacement 04/03/2022   Unilateral primary osteoarthritis, right knee 11/23/2019   OBESITY-MORBID (>100') 07/30/2009   SYNCOPE AND COLLAPSE 10/29/2008   OBSTRUCTIVE SLEEP APNEA 09/28/2008   HYPERLIPIDEMIA-MIXED 07/12/2008   HYPERTENSION, BENIGN 07/12/2008   CHEST PAIN-UNSPECIFIED 07/12/2008    REFERRING DIAG: s/p Rt TKA  THERAPY DIAG:  Acute pain of right knee  Localized edema  Other abnormalities of gait and mobility  Rationale for Evaluation and Treatment Rehabilitation  PERTINENT HISTORY: S/p Rt TKA (04/03/22), COPD, hypothyroid, anemia    PRECAUTIONS: None  SUBJECTIVE: My knee is stiff and sore this morning.   PAIN:  PAIN:  Are you having pain? Yes NPRS scale: 3-5/10 Pain location:  Rt knee  Pain orientation: Right  PAIN TYPE: aching and ore  Pain description: intermittent  Aggravating factors: standing, being too still, sometimes random Relieving factors: ice, stretching, rest    OBJECTIVE: (objective measures completed at initial evaluation unless otherwise dated)  DIAGNOSTIC FINDINGS: NA   PATIENT SURVEYS:  FOTO 41 (goal is 35)   COGNITION:           Overall cognitive status: Within functional limits for tasks assessed                          SENSATION: WFL   EDEMA:  Circumferential: Lt mid patellar: 17 inches, Rt 18.25 inches    MUSCLE LENGTH: Hamstring flexibility limited on Rt due to quad lad   POSTURE: rounded shoulders and forward head   PALPATION: Reduced scar mobility over distal 1/2 of scar.  Skin is dry and peeling around incision.  Edema and pain bilaterally.    LOWER EXTREMITY ROM:   Active ROM  Right eval Left eval Right 05/01/22  Hip flexion Limited by 25%     Hip extension       Hip abduction       Hip adduction       Hip internal rotation       Hip external rotation       Knee flexion 105   118  Knee extension 8     Ankle dorsiflexion full     Ankle plantarflexion       Ankle inversion       Ankle eversion        (Blank rows = not tested)   LOWER EXTREMITY MMT:   MMT Right eval Left eval  Hip flexion 4    Hip extension      Hip abduction      Hip adduction 4+    Hip internal rotation      Hip external rotation      Knee flexion 4    Knee extension 4 with lag    Ankle dorsiflexion 4+    Ankle plantarflexion      Ankle inversion      Ankle eversion       (Blank rows = not tested)                       Lt hip and knee: 4+/5 GAIT: Distance walked: 50 Assistive device utilized: None Level of assistance: Modified independence Comments: ER at Rt hip, antalgic with reduced time spent in stance on Rt and reduced heel strike    TODAY'S TREATMENT: 05/13/22: Nustep L3 x 10 min with PT present to discuss status Weight shifting 3 ways x 1 min each Heel raises 2x10 VC to contract quads Standing knee flexion 2# added 2x10 Rockerboard standing x3 min Leg press: seat 7, Bil legs 75# 2x10, 50# Rt only 2x10 6" step-ups: 2x10 4" step-downs: x10 Seated heel slides: x10 Weight shifting on balance pad 3 ways: 1 min each Manual: elongation and release to distal Rt quads and hamstrings, scar massage Game Ready 10 min 3 flakes Rt knee  05/11/22: Nustep L1 x 9 min with PT present to discuss status Long arc quads: 2# added 2x10- verbal cues for quad activation SLR x10, 2# added x10 Heel raises 2x10 VC to contract quads Standing knee flexion 2# added 2x10 Standing hip abduction 2x10 2# added-tactile and verbal cues to reduce trunk rotation Rockerboard standing x3 min 4" step-ups:  x15 on Rt Weight shifting on balance pad 3 ways: 1 min each Manual: elongation and release  to distal Rt quads and hamstrings, scar massage Game Ready 10 min 3 flakes Rt knee  05/07/22: Nustep L1 x 8 min with PT present to discuss status Long arc quads: 2# added 2x10- verbal cues for quad activation Seated flexion with foot on slider Heel raises 2x10 VC to contract quads Standing knee flexion 2# added 2x10 Standing hip abduction 2x10 2# added-tactile and verbal cues to reduce trunk rotation Rockerboard standing x3 min 4" step-ups: x15 on Rt Weight shifting on balance pad 3 ways: 1 min each Manual: elongation and release to distal Rt quads and hamstrings, scar massage Game Ready 10 min 3 flakes Rt knee     PATIENT EDUCATION:  Education details: scar massage, review of HEP issued by home health Person educated: Patient Education method: Explanation, Demonstration, and Handouts Education comprehension: verbalized understanding and returned demonstration     HOME EXERCISE PROGRAM: Review of HEP issued by home health PT   ASSESSMENT:   CLINICAL IMPRESSION: Pain is improved overall since last session.  Pt continues to demonstrate mild antalgia on level surface.  Pt worked on steps both ascending and descending today. Pt was challenged with eccentric control with step-downs and required verbal cues for alignment and speed. Pt with tension at distal quads and hamstrings and responded well to manual therapy.   Patient will benefit from skilled PT to address the below impairments and improve overall function.    OBJECTIVE IMPAIRMENTS decreased activity tolerance, decreased balance, decreased endurance, difficulty walking, decreased ROM, decreased strength, hypomobility, increased edema, impaired flexibility, and pain.    ACTIVITY LIMITATIONS lifting, bending, squatting, stairs, transfers, and locomotion level   PARTICIPATION LIMITATIONS: meal prep, driving, shopping, community activity, and yard work   PERSONAL FACTORS 1 comorbidity: s/p TKA  are also affecting patient's  functional outcome.    REHAB POTENTIAL: Excellent   CLINICAL DECISION MAKING: Stable/uncomplicated   EVALUATION COMPLEXITY: Low     GOALS: Goals reviewed with patient? Yes   SHORT TERM GOALS: Target date: 05/27/2022  Be independent in initial HEP Baseline: Goal status: Goal met 05/01/22   2.  Improve Rt LE strength to ascend steps with step-over-step gait pattern and use of 1 rail Baseline: working on this Goal status: in progress    3.  Demonstrate symmetry with gait on level surfaces without device  Baseline: mild antalgia without device  Goal status: In progress    4.  Reduce Rt knee pain to allow for standing > or = to 25 minutes without limitation  Baseline: 30 minutes (05/13/22) Goal status: MET   5.  Demonstrate > or = to 110 Rt knee flexion to improve squatting and negotiating steps  Baseline: 105 Goal status: INITIAL       LONG TERM GOALS: Target date: 06/24/2022    Be independent in advanced HEP Baseline:  Goal status: INITIAL   2.  Improve FOTO to > or = to 75 Baseline: 59 Goal status: INITIAL   3.  Reduce Rt knee pain and improve endurance to stand and walk > or = to 30-45 minutes for community activity. Baseline: 15 min Goal status: INITIAL   4.  Improve Rt knee strength and ROM to negotiate steps with step-over-step gait and use of 1 rail Baseline:  Goal status: INITIAL   5.  Demonstrate > or = to  115-120 degrees of Rt knee flexion to squat for home tasks  Baseline: 105 Goal status: INITIAL       PLAN: PT FREQUENCY: 2x/week   PT DURATION: 8 weeks   PLANNED INTERVENTIONS: Therapeutic exercises, Therapeutic activity, Neuromuscular re-education, Balance training, Gait training, Patient/Family education, Self Care, Joint mobilization, Stair training, Aquatic Therapy, Dry Needling, Electrical stimulation, Cryotherapy, Moist heat, scar mobilization, Taping, Vasopneumatic device, Manual therapy, and Re-evaluation   PLAN FOR NEXT SESSION: steps,  work on step-downs, proprioception, strength and flexibility.      Lorrene Reid, PT 05/13/22 11:02 AM   [facility] [address], [address] [place], [place] [address] Phone # [phone] Fax [phone]

## 2022-05-14 ENCOUNTER — Other Ambulatory Visit (HOSPITAL_COMMUNITY): Payer: Self-pay

## 2022-05-14 MED ORDER — TIZANIDINE HCL 4 MG PO TABS
4.0000 mg | ORAL_TABLET | Freq: Four times a day (QID) | ORAL | 0 refills | Status: DC | PRN
  Filled 2022-05-14: qty 30, 8d supply, fill #0

## 2022-05-18 ENCOUNTER — Ambulatory Visit: Payer: Medicare Other | Admitting: Physical Therapy

## 2022-05-18 ENCOUNTER — Encounter: Payer: Self-pay | Admitting: Physical Therapy

## 2022-05-18 DIAGNOSIS — R600 Localized edema: Secondary | ICD-10-CM

## 2022-05-18 DIAGNOSIS — M25561 Pain in right knee: Secondary | ICD-10-CM

## 2022-05-18 DIAGNOSIS — R2689 Other abnormalities of gait and mobility: Secondary | ICD-10-CM

## 2022-05-18 NOTE — Therapy (Signed)
OUTPATIENT PHYSICAL THERAPY TREATMENT NOTE   Patient Name: Tony Morrison MRN: [id number] DOB:[birth year], 66 y.o., male Today's Date: 05/18/2022  PCP:  Antony Haste, MD           REFERRING PROVIDER: Doneen Poisson, MD    END OF SESSION:   PT End of Session - 05/18/22 0849     Visit Number 6    Date for PT Re-Evaluation 06/24/22    Authorization Type [employer] Medicare    Progress Note Due on Visit 10    PT Start Time 0849    PT Stop Time 0931    PT Time Calculation (min) 42 min    Activity Tolerance Patient tolerated treatment well    Behavior During Therapy [facility] for tasks assessed/performed                 Past Medical History:  Diagnosis Date   Anemia    Anxiety    Arthritis    Chest pressure    cardiac cath; minimal non-obs CAD and normal EF 06/2008   Coronary artery disease    Depression    Diabetes mellitus    x5 years  type 2   Family history of anesthesia complication    mother n/v   GERD (gastroesophageal reflux disease)    Glaucoma    no per pt   Headache(784.0)     headaches   Cluster   Heart murmur    slight per pcp that is retired [name]  no issues    History of peptic ulcer disease    Hyperlipidemia    Hypertension    s/p syncopal episode with facial trauma due to orthostasis 10/2008. diuretic held   Hypothyroidism    Obesity    PONV (postoperative nausea and vomiting)    once   Sleep apnea    dont use CPAP   Syncopal episodes    likely due to orthostatic hypotension   Past Surgical History:  Procedure Laterality Date   left knee surgery     medial meniscus repair x2   MOUTH SURGERY     to make palate wider   NASAL HEMORRHAGE CONTROL N/A 03/24/2013   Procedure: EPISTAXIS CONTROL;  Surgeon: [name], MD;  Location: [facility] OR;  Service: ENT;  Laterality: N/A;   NASAL SEPTOPLASTY W/ TURBINOPLASTY N/A 03/24/2013   Procedure: NASAL SEPTOPLASTY WITH TURBINATE REDUCTION;  Surgeon: [name], MD;  Location: [facility] OR;   Service: ENT;  Laterality: N/A;   NECK SURGERY     C5-C6   TOTAL KNEE ARTHROPLASTY Right 04/03/2022   Procedure: RIGHT KNEE ARTHROPLASTY;  Surgeon: Kathryne Hitch, MD;  Location: [facility] ORS;  Service: Orthopedics;  Laterality: Right;   VASECTOMY     Patient Active Problem List   Diagnosis Date Noted   Status post total right knee replacement 04/03/2022   Unilateral primary osteoarthritis, right knee 11/23/2019   OBESITY-MORBID (>100') 07/30/2009   SYNCOPE AND COLLAPSE 10/29/2008   OBSTRUCTIVE SLEEP APNEA 09/28/2008   HYPERLIPIDEMIA-MIXED 07/12/2008   HYPERTENSION, BENIGN 07/12/2008   CHEST PAIN-UNSPECIFIED 07/12/2008    REFERRING DIAG: s/p Rt TKA  THERAPY DIAG:  Acute pain of right knee  Localized edema  Other abnormalities of gait and mobility  Rationale for Evaluation and Treatment Rehabilitation  PERTINENT HISTORY: S/p Rt TKA (04/03/22), COPD, hypothyroid, anemia    PRECAUTIONS: None  SUBJECTIVE: No pain, doing well  PAIN:  PAIN:  Are you having pain? No NPRS scale: 0/10 Pain location: Rt knee  Pain  orientation: Right  PAIN TYPE: aching and ore  Pain description: intermittent  Aggravating factors: standing, being too still, sometimes random Relieving factors: ice, stretching, rest    OBJECTIVE: (objective measures completed at initial evaluation unless otherwise dated)  DIAGNOSTIC FINDINGS: NA   PATIENT SURVEYS:  FOTO 58 (goal is 48)   COGNITION:           Overall cognitive status: Within functional limits for tasks assessed                          SENSATION: WFL   EDEMA:  Circumferential: Lt mid patellar: 17 inches, Rt 18.25 inches    MUSCLE LENGTH: Hamstring flexibility limited on Rt due to quad lad   POSTURE: rounded shoulders and forward head   PALPATION: Reduced scar mobility over distal 1/2 of scar.  Skin is dry and peeling around incision.  Edema and pain bilaterally.    LOWER EXTREMITY ROM:   Active ROM Right eval Left eval  Right 05/01/22  Hip flexion Limited by 25%     Hip extension       Hip abduction       Hip adduction       Hip internal rotation       Hip external rotation       Knee flexion 105   118  Knee extension 8     Ankle dorsiflexion full     Ankle plantarflexion       Ankle inversion       Ankle eversion        (Blank rows = not tested)   LOWER EXTREMITY MMT:   MMT Right eval Left eval  Hip flexion 4    Hip extension      Hip abduction      Hip adduction 4+    Hip internal rotation      Hip external rotation      Knee flexion 4    Knee extension 4 with lag    Ankle dorsiflexion 4+    Ankle plantarflexion      Ankle inversion      Ankle eversion       (Blank rows = not tested)                       Lt hip and knee: 4+/5 GAIT: Distance walked: 50 Assistive device utilized: None Level of assistance: Modified independence Comments: ER at Rt hip, antalgic with reduced time spent in stance on Rt and reduced heel strike    TODAY'S TREATMENT:  05/18/22:Nustep L3 x 10 min with PT present to discuss status Weight shifting 3 ways x 1 min each Heel raises on mini tramp 2x10 VC to contract quads Standing knee flexion 2# added 2x10 Rockerboard standing x3 min Leg press: seat 7, Bil legs 75# x10, 80# 10x : 55# Rt only 2x10 LAQ with ball squeeze 4# 3x10 6" step-ups: 15x Step downs on the first step 10x Game Ready 10 min 3 flakes Rt knee  05/13/22: Nustep L3 x 10 min with PT present to discuss status Weight shifting 3 ways x 1 min each Heel raises 2x10 VC to contract quads Standing knee flexion 2# added 2x10 Rockerboard standing x3 min Leg press: seat 7, Bil legs 75# 2x10, 50# Rt only 2x10 6" step-ups: 15x Step-downs at first step: x10 with BIL UE to get back up on step Seated heel slides: x10 Weight shifting on balance  pad 3 ways: 1 min each Manual: elongation and release to distal Rt quads and hamstrings, scar massage Game Ready 10 min 3 flakes Rt knee  05/11/22: Nustep L1  x 9 min with PT present to discuss status Long arc quads: 2# added 2x10- verbal cues for quad activation SLR x10, 2# added x10 Heel raises 2x10 VC to contract quads Standing knee flexion 2# added 2x10 Standing hip abduction 2x10 2# added-tactile and verbal cues to reduce trunk rotation Rockerboard standing x3 min 4" step-ups: x15 on Rt Weight shifting on balance pad 3 ways: 1 min each Manual: elongation and release to distal Rt quads and hamstrings, scar massage Game Ready 10 min 3 flakes Rt knee      PATIENT EDUCATION:  Education details: scar massage, review of HEP issued by home health Person educated: Patient Education method: Explanation, Demonstration, and Handouts Education comprehension: verbalized understanding and returned demonstration     HOME EXERCISE PROGRAM: Review of HEP issued by home health PT   ASSESSMENT:   CLINICAL IMPRESSION: Pt continues to report positive results post TKA . Loads increased today which pt tolerated well. Step downs are most challenging strength wise. Edema significantly less.   OBJECTIVE IMPAIRMENTS decreased activity tolerance, decreased balance, decreased endurance, difficulty walking, decreased ROM, decreased strength, hypomobility, increased edema, impaired flexibility, and pain.    ACTIVITY LIMITATIONS lifting, bending, squatting, stairs, transfers, and locomotion level   PARTICIPATION LIMITATIONS: meal prep, driving, shopping, community activity, and yard work   PERSONAL FACTORS 1 comorbidity: s/p TKA  are also affecting patient's functional outcome.    REHAB POTENTIAL: Excellent   CLINICAL DECISION MAKING: Stable/uncomplicated   EVALUATION COMPLEXITY: Low     GOALS: Goals reviewed with patient? Yes   SHORT TERM GOALS: Target date: 05/27/2022  Be independent in initial HEP Baseline: Goal status: Goal met 05/01/22   2.  Improve Rt LE strength to ascend steps with step-over-step gait pattern and use of 1 rail Baseline:  working on this Goal status: in progress    3.  Demonstrate symmetry with gait on level surfaces without device  Baseline: mild antalgia without device  Goal status: In progress    4.  Reduce Rt knee pain to allow for standing > or = to 25 minutes without limitation  Baseline: 30 minutes (05/13/22) Goal status: MET   5.  Demonstrate > or = to 110 Rt knee flexion to improve squatting and negotiating steps  Baseline: 105 Goal status: INITIAL       LONG TERM GOALS: Target date: 06/24/2022    Be independent in advanced HEP Baseline:  Goal status: INITIAL   2.  Improve FOTO to > or = to 75 Baseline: 59 Goal status: INITIAL   3.  Reduce Rt knee pain and improve endurance to stand and walk > or = to 30-45 minutes for community activity. Baseline: 15 min Goal status: INITIAL   4.  Improve Rt knee strength and ROM to negotiate steps with step-over-step gait and use of 1 rail Baseline:  Goal status: INITIAL   5.  Demonstrate > or = to  115-120 degrees of Rt knee flexion to squat for home tasks  Baseline: 105 Goal status: INITIAL       PLAN: PT FREQUENCY: 2x/week   PT DURATION: 8 weeks   PLANNED INTERVENTIONS: Therapeutic exercises, Therapeutic activity, Neuromuscular re-education, Balance training, Gait training, Patient/Family education, Self Care, Joint mobilization, Stair training, Aquatic Therapy, Dry Needling, Electrical stimulation, Cryotherapy, Moist heat, scar mobilization, Taping, Vasopneumatic  device, Manual therapy, and Re-evaluation   PLAN FOR NEXT SESSION: steps, work on step-downs, proprioception, strength and flexibility.      Ane Payment, PTA 05/18/22 9:23 AM   [facility] [address], [address] [place], [place] [address] Phone # [phone] Fax [phone]

## 2022-05-20 ENCOUNTER — Ambulatory Visit: Payer: Medicare Other

## 2022-05-20 DIAGNOSIS — M25561 Pain in right knee: Secondary | ICD-10-CM

## 2022-05-20 DIAGNOSIS — R600 Localized edema: Secondary | ICD-10-CM

## 2022-05-20 DIAGNOSIS — R2689 Other abnormalities of gait and mobility: Secondary | ICD-10-CM

## 2022-05-20 NOTE — Therapy (Signed)
OUTPATIENT PHYSICAL THERAPY TREATMENT NOTE   Patient Name: Tony Morrison MRN: [id number] DOB:November 07, 1955, 66 y.o., male Today's Date: 05/20/2022  PCP:  Antony Haste, MD           REFERRING PROVIDER: Doneen Poisson, MD    END OF SESSION:   PT End of Session - 05/20/22 1648     Visit Number 7    Date for PT Re-Evaluation 06/24/22    Authorization Type [employer] Medicare    Progress Note Due on Visit 10    PT Start Time 1609    PT Stop Time 1659    PT Time Calculation (min) 50 min    Activity Tolerance Patient tolerated treatment well    Behavior During Therapy [facility] for tasks assessed/performed                  Past Medical History:  Diagnosis Date   Anemia    Anxiety    Arthritis    Chest pressure    cardiac cath; minimal non-obs CAD and normal EF 06/2008   Coronary artery disease    Depression    Diabetes mellitus    x5 years  type 2   Family history of anesthesia complication    mother n/v   GERD (gastroesophageal reflux disease)    Glaucoma    no per pt   Headache(784.0)     headaches   Cluster   Heart murmur    slight per pcp that is retired [name]  no issues    History of peptic ulcer disease    Hyperlipidemia    Hypertension    s/p syncopal episode with facial trauma due to orthostasis 10/2008. diuretic held   Hypothyroidism    Obesity    PONV (postoperative nausea and vomiting)    once   Sleep apnea    dont use CPAP   Syncopal episodes    likely due to orthostatic hypotension   Past Surgical History:  Procedure Laterality Date   left knee surgery     medial meniscus repair x2   MOUTH SURGERY     to make palate wider   NASAL HEMORRHAGE CONTROL N/A 03/24/2013   Procedure: EPISTAXIS CONTROL;  Surgeon: [name], MD;  Location: [facility] OR;  Service: ENT;  Laterality: N/A;   NASAL SEPTOPLASTY W/ TURBINOPLASTY N/A 03/24/2013   Procedure: NASAL SEPTOPLASTY WITH TURBINATE REDUCTION;  Surgeon: [name], MD;  Location: [facility] OR;   Service: ENT;  Laterality: N/A;   NECK SURGERY     C5-C6   TOTAL KNEE ARTHROPLASTY Right 04/03/2022   Procedure: RIGHT KNEE ARTHROPLASTY;  Surgeon: Kathryne Hitch, MD;  Location: [facility];  Service: Orthopedics;  Laterality: Right;   VASECTOMY     Patient Active Problem List   Diagnosis Date Noted   Status post total right knee replacement 04/03/2022   Unilateral primary osteoarthritis, right knee 11/23/2019   OBESITY-MORBID (>100') 07/30/2009   SYNCOPE AND COLLAPSE 10/29/2008   OBSTRUCTIVE SLEEP APNEA 09/28/2008   HYPERLIPIDEMIA-MIXED 07/12/2008   HYPERTENSION, BENIGN 07/12/2008   CHEST PAIN-UNSPECIFIED 07/12/2008    REFERRING DIAG: s/p Rt TKA  THERAPY DIAG:  Acute pain of right knee  Localized edema  Other abnormalities of gait and mobility  Rationale for Evaluation and Treatment Rehabilitation  PERTINENT HISTORY: S/p Rt TKA (04/03/22), COPD, hypothyroid, anemia    PRECAUTIONS: None  SUBJECTIVE: Knee is doing well,  I'm icing and elevating throughout the day.   PAIN:  PAIN:  Are you having pain?  OUTPATIENT PHYSICAL THERAPY TREATMENT NOTE   Patient Name: Tony Morrison MRN: [id number] DOB:06/14/1956, 66 y.o., male Today's Date: 05/20/2022  PCP:  Anastasia Pall, MD           REFERRING PROVIDER: Jean Rosenthal, MD    END OF SESSION:   PT End of Session - 05/20/22 1648     Visit Number 7    Date for PT Re-Evaluation 06/24/22    Authorization Type [employer] Medicare    Progress Note Due on Visit 10    PT Start Time 1609    PT Stop Time 1659    PT Time Calculation (min) 50 min    Activity Tolerance Patient tolerated treatment well    Behavior During Therapy [facility] for tasks assessed/performed                  Past Medical History:  Diagnosis Date   Anemia    Anxiety    Arthritis    Chest pressure    cardiac cath; minimal non-obs CAD and normal EF 06/2008   Coronary artery disease    Depression    Diabetes mellitus    x5 years  type 2   Family history of anesthesia complication    mother n/v   GERD (gastroesophageal reflux disease)    Glaucoma    no per pt   Headache(784.0)     headaches   Cluster   Heart murmur    slight per pcp that is retired [name]  no issues    History of peptic ulcer disease    Hyperlipidemia    Hypertension    s/p syncopal episode with facial trauma due to orthostasis 10/2008. diuretic held   Hypothyroidism    Obesity    PONV (postoperative nausea and vomiting)    once   Sleep apnea    dont use CPAP   Syncopal episodes    likely due to orthostatic hypotension   Past Surgical History:  Procedure Laterality Date   left knee surgery     medial meniscus repair x2   MOUTH SURGERY     to make palate wider   NASAL HEMORRHAGE CONTROL N/A 03/24/2013   Procedure: EPISTAXIS CONTROL;  Surgeon: Jodi Marble, MD;  Location: [facility] OR;  Service: ENT;  Laterality: N/A;   NASAL SEPTOPLASTY W/ TURBINOPLASTY N/A 03/24/2013   Procedure: NASAL SEPTOPLASTY WITH TURBINATE REDUCTION;  Surgeon: Jodi Marble, MD;  Location: [place];   Service: ENT;  Laterality: N/A;   NECK SURGERY     C5-C6   TOTAL KNEE ARTHROPLASTY Right 04/03/2022   Procedure: RIGHT KNEE ARTHROPLASTY;  Surgeon: Mcarthur Rossetti, MD;  Location: [facility];  Service: Orthopedics;  Laterality: Right;   VASECTOMY     Patient Active Problem List   Diagnosis Date Noted   Status post total right knee replacement 04/03/2022   Unilateral primary osteoarthritis, right knee 11/23/2019   OBESITY-MORBID (>100') 07/30/2009   SYNCOPE AND COLLAPSE 10/29/2008   OBSTRUCTIVE SLEEP APNEA 09/28/2008   HYPERLIPIDEMIA-MIXED 07/12/2008   HYPERTENSION, BENIGN 07/12/2008   CHEST PAIN-UNSPECIFIED 07/12/2008    REFERRING DIAG: s/p Rt TKA  THERAPY DIAG:  Acute pain of right knee  Localized edema  Other abnormalities of gait and mobility  Rationale for Evaluation and Treatment Rehabilitation  PERTINENT HISTORY: S/p Rt TKA (04/03/22), COPD, hypothyroid, anemia    PRECAUTIONS: None  SUBJECTIVE: Knee is doing well,  I'm icing and elevating throughout the day.   PAIN:  PAIN:  Are you having pain?  Leg press: seat 7, Bil legs 75# x10, 80# 10x : 55# Rt only 2x10 LAQ with ball squeeze 4# 3x10 6" step-ups: 15x Step downs on the first step 10x Game Ready 10 min 3 flakes Rt knee  05/13/22: Nustep  L3 x 10 min with PT present to discuss status Weight shifting 3 ways x 1 min each Heel raises 2x10 VC to contract quads Standing knee flexion 2# added 2x10 Rockerboard standing x3 min Leg press: seat 7, Bil legs 75# 2x10, 50# Rt only 2x10 6" step-ups: 15x Step-downs at first step: x10 with BIL UE to get back up on step Seated heel slides: x10 Weight shifting on balance pad 3 ways: 1 min each Manual: elongation and release to distal Rt quads and hamstrings, scar massage Game Ready 10 min 3 flakes Rt knee     PATIENT EDUCATION:  Education details: scar massage, review of HEP issued by home health Person educated: Patient Education method: Explanation, Demonstration, and Handouts Education comprehension: verbalized understanding and returned demonstration     HOME EXERCISE PROGRAM: Review of HEP issued by home health PT   ASSESSMENT:   CLINICAL IMPRESSION: Pt continues to report positive results post TKA . Pt reports little to no pain and pt tolerated increased load with weights this week. Pt is able to walk 30 minutes and is ascending steps with step-over step gait pattern.  Pt with reduced eccentric control with descending 6" step with Rt LE and pt fatigues easily.  Pt did well with balance activities and required CGA for safety. Patient will benefit from skilled PT to address the below impairments and improve overall function.   OBJECTIVE IMPAIRMENTS decreased activity tolerance, decreased balance, decreased endurance, difficulty walking, decreased ROM, decreased strength, hypomobility, increased edema, impaired flexibility, and pain.    ACTIVITY LIMITATIONS lifting, bending, squatting, stairs, transfers, and locomotion level   PARTICIPATION LIMITATIONS: meal prep, driving, shopping, community activity, and yard work   PERSONAL FACTORS 1 comorbidity: s/p TKA  are also affecting patient's functional outcome.    REHAB POTENTIAL: Excellent   CLINICAL DECISION MAKING:  Stable/uncomplicated   EVALUATION COMPLEXITY: Low     GOALS: Goals reviewed with patient? Yes   SHORT TERM GOALS: Target date: 05/27/2022  Be independent in initial HEP Baseline: Goal status: Goal met 05/01/22   2.  Improve Rt LE strength to ascend steps with step-over-step gait pattern and use of 1 rail Baseline: alternating pattern with 1 rail (05/20/22) Goal status: MET   3.  Demonstrate symmetry with gait on level surfaces without device  Baseline: mild antalgia without device  Goal status: In progress    4.  Reduce Rt knee pain to allow for standing > or = to 25 minutes without limitation  Baseline: 30 minutes (05/13/22) Goal status: MET   5.  Demonstrate > or = to 110 Rt knee flexion to improve squatting and negotiating steps  Baseline: 105 Goal status: INITIAL       LONG TERM GOALS: Target date: 06/24/2022    Be independent in advanced HEP Baseline:  Goal status: INITIAL   2.  Improve FOTO to > or = to 75 Baseline: 59 Goal status: INITIAL   3.  Reduce Rt knee pain and improve endurance to stand and walk > or = to 30-45 minutes for community activity. Baseline: 30 min (05/20/22) Goal status: INITIAL   4.  Improve Rt knee strength and ROM to negotiate steps with step-over-step gait and use of 1 rail Baseline: reduced control with  OUTPATIENT PHYSICAL THERAPY TREATMENT NOTE   Patient Name: Tony Morrison MRN: [id number] DOB:06/14/1956, 66 y.o., male Today's Date: 05/20/2022  PCP:  Anastasia Pall, MD           REFERRING PROVIDER: Jean Rosenthal, MD    END OF SESSION:   PT End of Session - 05/20/22 1648     Visit Number 7    Date for PT Re-Evaluation 06/24/22    Authorization Type [employer] Medicare    Progress Note Due on Visit 10    PT Start Time 1609    PT Stop Time 1659    PT Time Calculation (min) 50 min    Activity Tolerance Patient tolerated treatment well    Behavior During Therapy [facility] for tasks assessed/performed                  Past Medical History:  Diagnosis Date   Anemia    Anxiety    Arthritis    Chest pressure    cardiac cath; minimal non-obs CAD and normal EF 06/2008   Coronary artery disease    Depression    Diabetes mellitus    x5 years  type 2   Family history of anesthesia complication    mother n/v   GERD (gastroesophageal reflux disease)    Glaucoma    no per pt   Headache(784.0)     headaches   Cluster   Heart murmur    slight per pcp that is retired [name]  no issues    History of peptic ulcer disease    Hyperlipidemia    Hypertension    s/p syncopal episode with facial trauma due to orthostasis 10/2008. diuretic held   Hypothyroidism    Obesity    PONV (postoperative nausea and vomiting)    once   Sleep apnea    dont use CPAP   Syncopal episodes    likely due to orthostatic hypotension   Past Surgical History:  Procedure Laterality Date   left knee surgery     medial meniscus repair x2   MOUTH SURGERY     to make palate wider   NASAL HEMORRHAGE CONTROL N/A 03/24/2013   Procedure: EPISTAXIS CONTROL;  Surgeon: Jodi Marble, MD;  Location: [facility] OR;  Service: ENT;  Laterality: N/A;   NASAL SEPTOPLASTY W/ TURBINOPLASTY N/A 03/24/2013   Procedure: NASAL SEPTOPLASTY WITH TURBINATE REDUCTION;  Surgeon: Jodi Marble, MD;  Location: [place];   Service: ENT;  Laterality: N/A;   NECK SURGERY     C5-C6   TOTAL KNEE ARTHROPLASTY Right 04/03/2022   Procedure: RIGHT KNEE ARTHROPLASTY;  Surgeon: Mcarthur Rossetti, MD;  Location: [facility];  Service: Orthopedics;  Laterality: Right;   VASECTOMY     Patient Active Problem List   Diagnosis Date Noted   Status post total right knee replacement 04/03/2022   Unilateral primary osteoarthritis, right knee 11/23/2019   OBESITY-MORBID (>100') 07/30/2009   SYNCOPE AND COLLAPSE 10/29/2008   OBSTRUCTIVE SLEEP APNEA 09/28/2008   HYPERLIPIDEMIA-MIXED 07/12/2008   HYPERTENSION, BENIGN 07/12/2008   CHEST PAIN-UNSPECIFIED 07/12/2008    REFERRING DIAG: s/p Rt TKA  THERAPY DIAG:  Acute pain of right knee  Localized edema  Other abnormalities of gait and mobility  Rationale for Evaluation and Treatment Rehabilitation  PERTINENT HISTORY: S/p Rt TKA (04/03/22), COPD, hypothyroid, anemia    PRECAUTIONS: None  SUBJECTIVE: Knee is doing well,  I'm icing and elevating throughout the day.   PAIN:  PAIN:  Are you having pain?

## 2022-05-22 ENCOUNTER — Other Ambulatory Visit (HOSPITAL_COMMUNITY): Payer: Self-pay

## 2022-05-24 ENCOUNTER — Other Ambulatory Visit: Payer: Self-pay | Admitting: Orthopaedic Surgery

## 2022-05-24 NOTE — Progress Notes (Unsigned)
05/26/22- 53 yoM former smoker for sleep evaluation courtesy of Dr Antony Haste with concern of OSA. No longer using CPAP.  Medical problem list includes CAD, HTN, GERD, DM2, Hypothyroid, Depression, Hyperlipidemia, Glaucoma, Obesity, Syncopal Episodes, NPSG 10/03/08- AHI 4/ hr/ RDI 11/ hr, desaturation to 84% , body weight 257 lbs NPSG 10/06/13- AHI 15.9/ hr, desaturation to 80%, body weight 240 lbs CPAP - not used in several years Epworth score-1 Body weight today-251 lbs Covid vaxx- Flu vax- -------Has not used a CPAP machine in 7-8 years. Pts wife states he snores.  Told at knee surgery that he needed to get OSA treated again. ENT surgery for repair of old nasal fracture.  Denies heart or lung problems. Occasional Tylenol PM sleep aid. Has also used alprazolam.  Working as a [employer].  Prior to Admission medications   Medication Sig Start Date End Date Taking? Authorizing Provider  ALPRAZolam Prudy Feeler) 0.5 MG tablet Take one tablet (0.5 mg dose) by mouth 3 (three) times a day as needed for Sleep or Anxiety. 12/01/21     ALPRAZolam (XANAX) 0.5 MG tablet Take one tablet (0.5 mg dose) by mouth 3 (three) times a day as needed for Sleep or Anxiety. 03/30/22     amLODipine (NORVASC) 5 MG tablet Take 1 tablet by mouth daily. 12/01/21     Ascorbic Acid (VITAMIN C PO) Take 1 tablet by mouth daily.    [provider]  aspirin 81 MG chewable tablet Chew 1 tablet (81 mg total) by mouth 2 (two) times daily. 04/04/22   Kathryne Hitch, MD  carvedilol (COREG) 25 MG tablet Take 1 tablet (25 mg dose) by mouth 2 (two) times daily with a meal. 07/29/21     doxycycline (VIBRA-TABS) 100 MG tablet Take 1 tablet by mouth 2 times daily. 04/09/22   Kathryne Hitch, MD  fenofibrate 160 MG tablet Take 1 tablet by mouth daily. 03/09/22     ferrous sulfate 325 (65 FE) MG EC tablet Take 1 tablet by mouth 2 times daily. Patient taking differently: Take 650 mg by mouth daily.  02/04/22     FLUoxetine (PROZAC) 40 MG capsule Take 1 capsule by mouth daily. 02/05/22     gabapentin (NEURONTIN) 100 MG capsule Take 1 capsule (100 mg total) by mouth 3 (three) times daily. Patient taking differently: Take 100 mg by mouth 3 (three) times daily as needed (pain). 12/11/19   Kathryne Hitch, MD  gabapentin (NEURONTIN) 100 MG capsule Take one capsule (100 mg dose) by mouth 3 (three) times a day. 04/07/22     glimepiride (AMARYL) 4 MG tablet Take 1 tablet by mouth 2 times daily. 04/13/22     hydrochlorothiazide (HYDRODIURIL) 25 MG tablet Take one half tablet (12.5 mg dose) by mouth daily. 10/06/21     HYDROcodone-acetaminophen (NORCO) 10-325 MG tablet Take 1 tablet by mouth every 4 hours as needed. 05/25/22   Kathryne Hitch, MD  ibuprofen (ADVIL) 200 MG tablet Take 800 mg by mouth every 6 (six) hours as needed for moderate pain.    [provider]  influenza vaccine adjuvanted (FLUAD) 0.5 ML injection Inject into the muscle. 05/11/22   Judyann Munson, MD  insulin lispro (HUMALOG KWIKPEN) 100 UNIT/ML KwikPen Inject 10 Units into the skin 3 (three) times daily with meals. 05/04/22     LANTUS SOLOSTAR 100 UNIT/ML Solostar Pen INJECT 70 UNITS INTO THE SKIN EVERY MORNING 10/09/21     levothyroxine (SYNTHROID) 50 MCG tablet Take 1  tablet by mouth daily. 02/04/22     levothyroxine (SYNTHROID) 50 MCG tablet Take one tablet (50 mcg dose) by mouth daily. 04/01/22     lisinopril (ZESTRIL) 40 MG tablet TAKE 1 TABLET BY MOUTH ONCE A DAY 11/20/21     ONETOUCH VERIO test strip Use to test blood sugar 3 times a day 12/26/21     ONETOUCH VERIO test strip Use to check blood sugar 3 times a day. 12/26/21     pantoprazole (PROTONIX) 40 MG tablet Take 1 tablet (40 mg dose) by mouth daily. 07/29/21     rosuvastatin (CRESTOR) 5 MG tablet Take 1 tablet by mouth daily. 02/11/22     SitaGLIPtin-MetFORMIN HCl (JANUMET XR) 50-1000 MG TB24 Take one tablet by mouth daily. 01/16/22     SitaGLIPtin-MetFORMIN  HCl (JANUMET XR) 50-1000 MG TB24 Take 1 tablet by mouth daily. 01/16/22     tiZANidine (ZANAFLEX) 4 MG tablet Take 1 tablet (4 mg total) by mouth every 6 (six) hours as needed for muscle spasms. 05/25/22   Kathryne Hitch, MD  UNIFINE PENTIPS 31G X 8 MM MISC USE AS DIRECTED 12/21/21     UNIFINE PENTIPS 31G X 8 MM MISC USE AS DIRECTED 12/26/21      Past Medical History:  Diagnosis Date   Anemia    Anxiety    Arthritis    Chest pressure    cardiac cath; minimal non-obs CAD and normal EF 06/2008   Coronary artery disease    Depression    Diabetes mellitus    x5 years  type 2   Family history of anesthesia complication    mother n/v   GERD (gastroesophageal reflux disease)    Glaucoma    no per pt   Headache(784.0)     headaches   Cluster   Heart murmur    slight per pcp that is retired [name]  no issues    History of peptic ulcer disease    Hyperlipidemia    Hypertension    s/p syncopal episode with facial trauma due to orthostasis 10/2008. diuretic held   Hypothyroidism    Obesity    PONV (postoperative nausea and vomiting)    once   Sleep apnea    dont use CPAP   Syncopal episodes    likely due to orthostatic hypotension   Past Surgical History:  Procedure Laterality Date   left knee surgery     medial meniscus repair x2   MOUTH SURGERY     to make palate wider   NASAL HEMORRHAGE CONTROL N/A 03/24/2013   Procedure: EPISTAXIS CONTROL;  Surgeon: [name], MD;  Location: [facility] OR;  Service: ENT;  Laterality: N/A;   NASAL SEPTOPLASTY W/ TURBINOPLASTY N/A 03/24/2013   Procedure: NASAL SEPTOPLASTY WITH TURBINATE REDUCTION;  Surgeon: [name], MD;  Location: [facility] OR;  Service: ENT;  Laterality: N/A;   NECK SURGERY     C5-C6   TOTAL KNEE ARTHROPLASTY Right 04/03/2022   Procedure: RIGHT KNEE ARTHROPLASTY;  Surgeon: Kathryne Hitch, MD;  Location: [facility] ORS;  Service: Orthopedics;  Laterality: Right;   VASECTOMY     Family History  Problem Relation Age of  Onset   Heart disease Mother 36       CABG   Cancer Father        lung   Heart disease Father 51       CABG   Social History   Socioeconomic History   Marital status: Married  Spouse name: Not on file   Number of children: 1   Years of education: Not on file   Highest education level: Not on file  Occupational History   Occupation: Works at [employer]  Tobacco Use   Smoking status: Former    Packs/day: 1.00    Years: 10.00    Total pack years: 10.00    Types: Cigarettes    Quit date: 08/10/1982    Years since quitting: 39.8   Smokeless tobacco: Never  Vaping Use   Vaping Use: Never used  Substance and Sexual Activity   Alcohol use: No   Drug use: No   Sexual activity: Not Currently  Other Topics Concern   Not on file  Social History Narrative   Not on file   Social Determinants of Health   Financial Resource Strain: Not on file  Food Insecurity: Not on file  Transportation Needs: Not on file  Physical Activity: Not on file  Stress: Not on file  Social Connections: Not on file  Intimate Partner Violence: Not on file   ROS-see HPI  + = positive Constitutional:    weight loss, night sweats, fevers, chills, fatigue, lassitude. HEENT:    headaches, difficulty swallowing, tooth/dental problems, sore throat,       sneezing, itching, ear ache, nasal congestion, post nasal drip, snoring CV:    chest pain, orthopnea, PND, swelling in lower extremities, anasarca,                dizziness, palpitations Resp:   shortness of breath with exertion or at rest.                productive cough,   non-productive cough, coughing up of blood.              change in color of mucus.  wheezing.   Skin:    rash or lesions. GI:  No-   heartburn, indigestion, abdominal pain, nausea, vomiting, diarrhea,                 change in bowel habits, loss of appetite GU: dysuria, change in color of urine, no urgency or frequency.   flank pain. MS:   joint pain, stiffness, decreased  range of motion, back pain. Neuro-     nothing unusual Psych:  change in mood or affect.  depression or anxiety.   memory loss.  OBJ- Physical Exam General- Alert, Oriented, Affect-appropriate, Distress- none acute, + obese Skin- rash-none, lesions- none, excoriation- none Lymphadenopathy- none Head- atraumatic            Eyes- Gross vision intact, PERRLA, conjunctivae and secretions clear            Ears- Hearing, canals-normal            Nose- Clear, no-Septal dev, mucus, polyps, erosion, perforation             Throat- Mallampati III-IV , mucosa clear , drainage- none, tonsils- atrophic, +teeth Neck- flexible , trachea midline, no stridor , thyroid nl, carotid no bruit Chest - symmetrical excursion , unlabored           Heart/CV- RRR , no murmur , no gallop  , no rub, nl s1 s2                           - JVD- none , edema- none, stasis changes- none, varices- none  Lung- clear to P&A, wheeze- none, cough- none , dullness-none, rub- none           Chest wall-  Abd-  Br/ Gen/ Rectal- Not done, not indicated Extrem- cyanosis- none, clubbing, none, atrophy- none, strength- nl Neuro- grossly intact to observation

## 2022-05-25 ENCOUNTER — Ambulatory Visit: Payer: Medicare Other | Admitting: Physical Therapy

## 2022-05-25 ENCOUNTER — Encounter: Payer: Self-pay | Admitting: Physical Therapy

## 2022-05-25 ENCOUNTER — Other Ambulatory Visit (HOSPITAL_COMMUNITY): Payer: Self-pay

## 2022-05-25 DIAGNOSIS — R2689 Other abnormalities of gait and mobility: Secondary | ICD-10-CM

## 2022-05-25 DIAGNOSIS — M25561 Pain in right knee: Secondary | ICD-10-CM | POA: Diagnosis not present

## 2022-05-25 DIAGNOSIS — R600 Localized edema: Secondary | ICD-10-CM

## 2022-05-25 MED ORDER — TIZANIDINE HCL 4 MG PO TABS
4.0000 mg | ORAL_TABLET | Freq: Four times a day (QID) | ORAL | 0 refills | Status: DC | PRN
  Filled 2022-05-25: qty 30, 8d supply, fill #0

## 2022-05-25 MED ORDER — HYDROCODONE-ACETAMINOPHEN 10-325 MG PO TABS
1.0000 | ORAL_TABLET | ORAL | 0 refills | Status: DC | PRN
  Filled 2022-05-25: qty 30, 5d supply, fill #0

## 2022-05-25 NOTE — Therapy (Signed)
OUTPATIENT PHYSICAL THERAPY TREATMENT NOTE   Patient Name: Tony Morrison MRN: [id number] DOB:[birth year], 66 y.o., male Today's Date: 05/25/2022  PCP:  Antony Haste, MD           REFERRING PROVIDER: Doneen Poisson, MD    END OF SESSION:   PT End of Session - 05/25/22 1524     Visit Number 8    Date for PT Re-Evaluation 06/24/22    Authorization Type [employer] Medicare    Progress Note Due on Visit 10    PT Start Time 1525    PT Stop Time 1620    PT Time Calculation (min) 55 min    Activity Tolerance Patient tolerated treatment well    Behavior During Therapy [facility] for tasks assessed/performed                   Past Medical History:  Diagnosis Date   Anemia    Anxiety    Arthritis    Chest pressure    cardiac cath; minimal non-obs CAD and normal EF 06/2008   Coronary artery disease    Depression    Diabetes mellitus    x5 years  type 2   Family history of anesthesia complication    mother n/v   GERD (gastroesophageal reflux disease)    Glaucoma    no per pt   Headache(784.0)     headaches   Cluster   Heart murmur    slight per pcp that is retired [name]  no issues    History of peptic ulcer disease    Hyperlipidemia    Hypertension    s/p syncopal episode with facial trauma due to orthostasis 10/2008. diuretic held   Hypothyroidism    Obesity    PONV (postoperative nausea and vomiting)    once   Sleep apnea    dont use CPAP   Syncopal episodes    likely due to orthostatic hypotension   Past Surgical History:  Procedure Laterality Date   left knee surgery     medial meniscus repair x2   MOUTH SURGERY     to make palate wider   NASAL HEMORRHAGE CONTROL N/A 03/24/2013   Procedure: EPISTAXIS CONTROL;  Surgeon: [name], MD;  Location: [facility] OR;  Service: ENT;  Laterality: N/A;   NASAL SEPTOPLASTY W/ TURBINOPLASTY N/A 03/24/2013   Procedure: NASAL SEPTOPLASTY WITH TURBINATE REDUCTION;  Surgeon: [name], MD;  Location: [facility] OR;   Service: ENT;  Laterality: N/A;   NECK SURGERY     C5-C6   TOTAL KNEE ARTHROPLASTY Right 04/03/2022   Procedure: RIGHT KNEE ARTHROPLASTY;  Surgeon: Kathryne Hitch, MD;  Location: [facility] ORS;  Service: Orthopedics;  Laterality: Right;   VASECTOMY     Patient Active Problem List   Diagnosis Date Noted   Status post total right knee replacement 04/03/2022   Unilateral primary osteoarthritis, right knee 11/23/2019   OBESITY-MORBID (>100') 07/30/2009   SYNCOPE AND COLLAPSE 10/29/2008   OBSTRUCTIVE SLEEP APNEA 09/28/2008   HYPERLIPIDEMIA-MIXED 07/12/2008   HYPERTENSION, BENIGN 07/12/2008   CHEST PAIN-UNSPECIFIED 07/12/2008    REFERRING DIAG: s/p Rt TKA  THERAPY DIAG:  Acute pain of right knee  Localized edema  Other abnormalities of gait and mobility  Rationale for Evaluation and Treatment Rehabilitation  PERTINENT HISTORY: S/p Rt TKA (04/03/22), COPD, hypothyroid, anemia    PRECAUTIONS: None  SUBJECTIVE: I think I got shin splints last time I was here.    PAIN:  Are you having pain? No  NPRS scale: 0/10 Pain location: Rt knee  Pain orientation: Right  PAIN TYPE: aching and ore  Pain description: intermittent  Aggravating factors: standing, being too still, sometimes random Relieving factors: ice, stretching, rest    OBJECTIVE: (objective measures completed at initial evaluation unless otherwise dated)  DIAGNOSTIC FINDINGS: NA   PATIENT SURVEYS:  FOTO 5 (goal is 48)   COGNITION:           Overall cognitive status: Within functional limits for tasks assessed                          SENSATION: WFL   EDEMA:  Circumferential: Lt mid patellar: 17 inches, Rt 18.25 inches    MUSCLE LENGTH: Hamstring flexibility limited on Rt due to quad lad   POSTURE: rounded shoulders and forward head   PALPATION: Reduced scar mobility over distal 1/2 of scar.  Skin is dry and peeling around incision.  Edema and pain bilaterally.    LOWER EXTREMITY ROM:   Active ROM  Right eval Left eval Right 05/01/22 Right  05/20/22  Hip flexion Limited by 25%      Hip extension        Hip abduction        Hip adduction        Hip internal rotation        Hip external rotation        Knee flexion 105   118   Knee extension 8      Ankle dorsiflexion full      Ankle plantarflexion        Ankle inversion        Ankle eversion         (Blank rows = not tested)   LOWER EXTREMITY MMT:   MMT Right eval Left eval  Hip flexion 4    Hip extension      Hip abduction      Hip adduction 4+    Hip internal rotation      Hip external rotation      Knee flexion 4    Knee extension 4 with lag    Ankle dorsiflexion 4+    Ankle plantarflexion      Ankle inversion      Ankle eversion       (Blank rows = not tested)                       Lt hip and knee: 4+/5 GAIT: Distance walked: 50 Assistive device utilized: None Level of assistance: Modified independence Comments: ER at Rt hip, antalgic with reduced time spent in stance on Rt and reduced heel strike    TODAY'S TREATMENT:   05/25/22: Nustep L3 x 10 min with PTA present to discuss status Weight shifting 3 ways x 1 min each Heel raises on mini tramp 2x10 VC to contract quads Hurdles: side stepping 15x with Vc to flex hip &knee more VS DF ankle Rockerboard standing 20x AROM, then static hold 20 sec 2x Leg press: seat 7, Bil legs 90# x25, 55# Rt only x20 LAQ with ball squeeze 5# 2x10 Game Ready 10 min 3 flakes Rt knee  05/20/22: Nustep L3 x 10 min with PT present to discuss status Weight shifting 3 ways x 1 min each Heel raises on mini tramp 2x10 VC to contract quads Hurdles: forward 5x Rockerboard standing 20x AROM, then static hold 20 sec 2x Leg press: seat 7,  Bil legs 80# x25, 55# Rt only x20 LAQ with ball squeeze 4# 2x10 8" step-ups: 15x  Step downs on the first step 10x- verbal cues for alignment and control  Game Ready 10 min 3 flakes Rt knee  05/18/22:Nustep L3 x 10 min with PT present to  discuss status Weight shifting 3 ways x 1 min each Heel raises on mini tramp 2x10 VC to contract quads Standing knee flexion 2# added 2x10 Rockerboard standing x3 min Leg press: seat 7, Bil legs 75# x10, 80# 10x : 55# Rt only 2x10 LAQ with ball squeeze 4# 3x10 6" step-ups: 15x Step downs on the first step 10x Game Ready 10 min 3 flakes Rt knee    PATIENT EDUCATION:  Education details: scar massage, review of HEP issued by home health Person educated: Patient Education method: Explanation, Demonstration, and Handouts Education comprehension: verbalized understanding and returned demonstration     HOME EXERCISE PROGRAM: Review of HEP issued by home health PT   ASSESSMENT:   CLINICAL IMPRESSION: Pt arrives for PT with no knee pain. Pt does report Rt "shin pain" after last visit. It is possible pt compensates at his ankle (DF) when asked to flex hip and knee to deeper levels when standing.   OBJECTIVE IMPAIRMENTS decreased activity tolerance, decreased balance, decreased endurance, difficulty walking, decreased ROM, decreased strength, hypomobility, increased edema, impaired flexibility, and pain.    ACTIVITY LIMITATIONS lifting, bending, squatting, stairs, transfers, and locomotion level   PARTICIPATION LIMITATIONS: meal prep, driving, shopping, community activity, and yard work   PERSONAL FACTORS 1 comorbidity: s/p TKA  are also affecting patient's functional outcome.    REHAB POTENTIAL: Excellent   CLINICAL DECISION MAKING: Stable/uncomplicated   EVALUATION COMPLEXITY: Low     GOALS: Goals reviewed with patient? Yes   SHORT TERM GOALS: Target date: 05/27/2022  Be independent in initial HEP Baseline: Goal status: Goal met 05/01/22   2.  Improve Rt LE strength to ascend steps with step-over-step gait pattern and use of 1 rail Baseline: alternating pattern with 1 rail (05/20/22) Goal status: MET   3.  Demonstrate symmetry with gait on level surfaces without device   Baseline: mild antalgia without device  Goal status: In progress    4.  Reduce Rt knee pain to allow for standing > or = to 25 minutes without limitation  Baseline: 30 minutes (05/13/22) Goal status: MET   5.  Demonstrate > or = to 110 Rt knee flexion to improve squatting and negotiating steps  Baseline: 105 Goal status: INITIAL       LONG TERM GOALS: Target date: 06/24/2022    Be independent in advanced HEP Baseline:  Goal status: INITIAL   2.  Improve FOTO to > or = to 75 Baseline: 59 Goal status: INITIAL   3.  Reduce Rt knee pain and improve endurance to stand and walk > or = to 30-45 minutes for community activity. Baseline: 30 min (05/20/22) Goal status: INITIAL   4.  Improve Rt knee strength and ROM to negotiate steps with step-over-step gait and use of 1 rail Baseline: reduced control with descending (05/20/22) Goal status: INITIAL   5.  Demonstrate > or = to  115-120 degrees of Rt knee flexion to squat for home tasks  Baseline: 105 Goal status: INITIAL       PLAN: PT FREQUENCY: 2x/week   PT DURATION: 8 weeks   PLANNED INTERVENTIONS: Therapeutic exercises, Therapeutic activity, Neuromuscular re-education, Balance training, Gait training, Patient/Family education, Self Care, Joint mobilization,  Stair training, Aquatic Therapy, Dry Needling, Electrical stimulation, Cryotherapy, Moist heat, scar mobilization, Taping, Vasopneumatic device, Manual therapy, and Re-evaluation   PLAN FOR NEXT SESSION: To MD after next sessison, send note.  Ane Payment, PTA 05/25/22 4:12 PM    [facility] [address], [address] [place], [place] [address] Phone # [phone] Fax [phone]

## 2022-05-26 ENCOUNTER — Ambulatory Visit (INDEPENDENT_AMBULATORY_CARE_PROVIDER_SITE_OTHER): Payer: Medicare Other | Admitting: Internal Medicine

## 2022-05-26 ENCOUNTER — Other Ambulatory Visit (HOSPITAL_COMMUNITY): Payer: Self-pay

## 2022-05-26 ENCOUNTER — Encounter: Payer: Self-pay | Admitting: Internal Medicine

## 2022-05-26 VITALS — BP 144/68 | HR 63 | Ht 68.0 in | Wt 251.4 lb

## 2022-05-26 DIAGNOSIS — G4733 Obstructive sleep apnea (adult) (pediatric): Secondary | ICD-10-CM | POA: Diagnosis not present

## 2022-05-26 NOTE — Assessment & Plan Note (Signed)
Encourage effort toward normal body weight

## 2022-05-26 NOTE — Patient Instructions (Signed)
Order- schedule home sleep test   dx OSA  Please call us about 2 weeks after your sleep study for results and recommendation.

## 2022-05-26 NOTE — Assessment & Plan Note (Addendum)
We reviewed the basic mechanics of obstructive sleep apnea and discussed treatment options.  Likely restart CPAP if appropriate based on sleep study. Plan-schedule home sleep test

## 2022-05-27 ENCOUNTER — Ambulatory Visit: Payer: Medicare Other

## 2022-05-27 DIAGNOSIS — M25561 Pain in right knee: Secondary | ICD-10-CM | POA: Diagnosis not present

## 2022-05-27 DIAGNOSIS — R2689 Other abnormalities of gait and mobility: Secondary | ICD-10-CM

## 2022-05-27 DIAGNOSIS — R600 Localized edema: Secondary | ICD-10-CM

## 2022-05-27 NOTE — Therapy (Signed)
OUTPATIENT PHYSICAL THERAPY TREATMENT NOTE   Patient Name: Tony Morrison MRN: [id number] DOB:07-Feb-1956, 66 y.o., male Today's Date: 05/27/2022  PCP:  Antony Haste, MD           REFERRING PROVIDER: Doneen Poisson, MD    END OF SESSION:   PT End of Session - 05/27/22 1530     Visit Number 9    Date for PT Re-Evaluation 06/24/22    Authorization Type [employer] Medicare    Progress Note Due on Visit 10    PT Start Time 1530    PT Stop Time 1617    PT Time Calculation (min) 47 min    Activity Tolerance Patient tolerated treatment well    Behavior During Therapy [facility] for tasks assessed/performed                    Past Medical History:  Diagnosis Date   Anemia    Anxiety    Arthritis    Chest pressure    cardiac cath; minimal non-obs CAD and normal EF 06/2008   Coronary artery disease    Depression    Diabetes mellitus    x5 years  type 2   Family history of anesthesia complication    mother n/v   GERD (gastroesophageal reflux disease)    Glaucoma    no per pt   Headache(784.0)     headaches   Cluster   Heart murmur    slight per pcp that is retired [name]  no issues    History of peptic ulcer disease    Hyperlipidemia    Hypertension    s/p syncopal episode with facial trauma due to orthostasis 10/2008. diuretic held   Hypothyroidism    Obesity    PONV (postoperative nausea and vomiting)    once   Sleep apnea    dont use CPAP   Syncopal episodes    likely due to orthostatic hypotension   Past Surgical History:  Procedure Laterality Date   left knee surgery     medial meniscus repair x2   MOUTH SURGERY     to make palate wider   NASAL HEMORRHAGE CONTROL N/A 03/24/2013   Procedure: EPISTAXIS CONTROL;  Surgeon: [name], MD;  Location: [facility] OR;  Service: ENT;  Laterality: N/A;   NASAL SEPTOPLASTY W/ TURBINOPLASTY N/A 03/24/2013   Procedure: NASAL SEPTOPLASTY WITH TURBINATE REDUCTION;  Surgeon: [name], MD;  Location: [facility]  OR;  Service: ENT;  Laterality: N/A;   NECK SURGERY     C5-C6   TOTAL KNEE ARTHROPLASTY Right 04/03/2022   Procedure: RIGHT KNEE ARTHROPLASTY;  Surgeon: Kathryne Hitch, MD;  Location: [facility] ORS;  Service: Orthopedics;  Laterality: Right;   VASECTOMY     Patient Active Problem List   Diagnosis Date Noted   Status post total right knee replacement 04/03/2022   Unilateral primary osteoarthritis, right knee 11/23/2019   OBESITY-MORBID (>100') 07/30/2009   SYNCOPE AND COLLAPSE 10/29/2008   OBSTRUCTIVE SLEEP APNEA 09/28/2008   HYPERLIPIDEMIA-MIXED 07/12/2008   HYPERTENSION, BENIGN 07/12/2008   CHEST PAIN-UNSPECIFIED 07/12/2008    REFERRING DIAG: s/p Rt TKA  THERAPY DIAG:  Acute pain of right knee  Localized edema  Other abnormalities of gait and mobility  Rationale for Evaluation and Treatment Rehabilitation  PERTINENT HISTORY: S/p Rt TKA (04/03/22), COPD, hypothyroid, anemia    PRECAUTIONS: None  SUBJECTIVE: I am having more pain in my Rt knee today.  I had to take pain meds.  I'm not  sure what is causing it    PAIN:  Are you having pain? No NPRS scale: 3/10 Pain location: Rt knee  Pain orientation: Right  PAIN TYPE: aching and ore  Pain description: intermittent  Aggravating factors: standing, being too still, sometimes random Relieving factors: ice, stretching, rest    OBJECTIVE: (objective measures completed at initial evaluation unless otherwise dated)  DIAGNOSTIC FINDINGS: NA   PATIENT SURVEYS:  FOTO 11 (goal is 75) 05/27/22: FOTO 56    COGNITION:           Overall cognitive status: Within functional limits for tasks assessed                          SENSATION: WFL   EDEMA:  Circumferential: Lt mid patellar: 17 inches, Rt 18.25 inches    MUSCLE LENGTH: Hamstring flexibility limited on Rt due to quad lad   POSTURE: rounded shoulders and forward head   PALPATION: Reduced scar mobility over distal 1/2 of scar.  Skin is dry and peeling around  incision.  Edema and pain bilaterally.    LOWER EXTREMITY ROM:   Active ROM Right eval Left eval Right 05/01/22 Right  05/27/22  Hip flexion Limited by 25%      Hip extension        Hip abduction        Hip adduction        Hip internal rotation        Hip external rotation        Knee flexion 105   118 118  Knee extension 8      Ankle dorsiflexion full      Ankle plantarflexion        Ankle inversion        Ankle eversion         (Blank rows = not tested)   LOWER EXTREMITY MMT:   MMT Right eval Right 05/27/22  Hip flexion 4 4+  Hip extension     Hip abduction     Hip adduction 4+   Hip internal rotation     Hip external rotation     Knee flexion 4 4+  Knee extension 4 with lag 5   Ankle dorsiflexion 4+ 5  Ankle plantarflexion     Ankle inversion     Ankle eversion      (Blank rows = not tested)                       Lt hip and knee: 4+/5 GAIT: Distance walked: 50 Assistive device utilized: None Level of assistance: Modified independence Comments: ER at Rt hip, antalgic with reduced time spent in stance on Rt and reduced heel strike    TODAY'S TREATMENT: 05/27/22: Nustep L3 x 10 min with PTA present to discuss status Weight shifting 3 ways x 1 min each Heel raises on mini tramp 2x10 VC to contract quads Rockerboard standing 20x AROM, then static hold 20 sec 2x LAQ with ball squeeze 5# 2x10 Game Ready 10 min 3 flakes Rt knee 05/25/22: Nustep L3 x 10 min with PTA present to discuss status Weight shifting 3 ways x 1 min each Heel raises on mini tramp 2x10 VC to contract quads Hurdles: side stepping 15x with Vc to flex hip &knee more VS DF ankle Rockerboard standing 20x AROM, then static hold 20 sec 2x Leg press: seat 7, Bil legs 90# x25, 55# Rt only x20 LAQ with  ball squeeze 5# 2x10 Game Ready 10 min 3 flakes Rt knee  05/20/22: Nustep L3 x 10 min with PT present to discuss status Weight shifting 3 ways x 1 min each Heel raises on mini tramp 2x10 VC to  contract quads Hurdles: forward 5x Rockerboard standing 20x AROM, then static hold 20 sec 2x Leg press: seat 7, Bil legs 80# x25, 55# Rt only x20 LAQ with ball squeeze 4# 2x10 8" step-ups: 15x  Step downs on the first step 10x- verbal cues for alignment and control  Game Ready 10 min 3 flakes Rt knee    PATIENT EDUCATION:  Education details: scar massage, review of HEP issued by home health Person educated: Patient Education method: Explanation, Demonstration, and Handouts Education comprehension: verbalized understanding and returned demonstration     HOME EXERCISE PROGRAM: Review of HEP issued by home health PT   ASSESSMENT:   CLINICAL IMPRESSION: Pt reports increased Rt knee pain over the past few days. Pt is not sure what has caused this and he is not sure what has caused this.  Pt continues to demonstrate antalgic gait pattern with device, this is normalizing.  Pt reports that he is able to walk for 30-45 minutes in the community.  Rt knee strength is improved and pt no longer demonstrates lag with long arc quads.  Pt will continue to benefit from skilled PT to address Rt knee ROM, strength, gait, and edema to allow for return to prior level of function s/p TKA.  OBJECTIVE IMPAIRMENTS decreased activity tolerance, decreased balance, decreased endurance, difficulty walking, decreased ROM, decreased strength, hypomobility, increased edema, impaired flexibility, and pain.    ACTIVITY LIMITATIONS lifting, bending, squatting, stairs, transfers, and locomotion level   PARTICIPATION LIMITATIONS: meal prep, driving, shopping, community activity, and yard work   PERSONAL FACTORS 1 comorbidity: s/p TKA  are also affecting patient's functional outcome.    REHAB POTENTIAL: Excellent   CLINICAL DECISION MAKING: Stable/uncomplicated   EVALUATION COMPLEXITY: Low     GOALS: Goals reviewed with patient? Yes   SHORT TERM GOALS: Target date: 05/27/2022  Be independent in initial  HEP Baseline: Goal status: Goal met 05/01/22   2.  Improve Rt LE strength to ascend steps with step-over-step gait pattern and use of 1 rail Baseline: alternating pattern with 1 rail (05/20/22) Goal status: MET   3.  Demonstrate symmetry with gait on level surfaces without device  Baseline: mild antalgia without device  Goal status: In progress    4.  Reduce Rt knee pain to allow for standing > or = to 25 minutes without limitation  Baseline: 30-45 minutes (05/27/22) Goal status: MET   5.  Demonstrate > or = to 110 Rt knee flexion to improve squatting and negotiating steps  Baseline: 105 Goal status: INITIAL       LONG TERM GOALS: Target date: 06/24/2022    Be independent in advanced HEP Baseline:  Goal status: in progress    2.  Improve FOTO to > or = to 75 Baseline: 56 Goal status: in progress    3.  Reduce Rt knee pain and improve endurance to stand and walk > or = to 30-45 minutes for community activity. Baseline: 30 min -45- variable (05/27/22) Goal status: INITIAL   4.  Improve Rt knee strength and ROM to negotiate steps with step-over-step gait and use of 1 rail Baseline: reduced control with descending (05/20/22) Goal status: INITIAL   5.  Demonstrate > or = to  115-120 degrees of Rt  knee flexion to squat for home tasks  Baseline: 118 (05/27/22) Goal status: MET        PLAN: PT FREQUENCY: 2x/week   PT DURATION: 8 weeks   PLANNED INTERVENTIONS: Therapeutic exercises, Therapeutic activity, Neuromuscular re-education, Balance training, Gait training, Patient/Family education, Self Care, Joint mobilization, Stair training, Aquatic Therapy, Dry Needling, Electrical stimulation, Cryotherapy, Moist heat, scar mobilization, Taping, Vasopneumatic device, Manual therapy, and Re-evaluation   PLAN FOR NEXT SESSION: see what MD says, continue strength, flexibility and pain management  Lorrene Reid, PT 05/27/22 4:06 PM   [facility] [address], [address] [place], [place] [address] Phone # [phone] Fax [phone]

## 2022-05-28 ENCOUNTER — Ambulatory Visit (INDEPENDENT_AMBULATORY_CARE_PROVIDER_SITE_OTHER): Payer: Medicare Other | Admitting: Orthopaedic Surgery

## 2022-05-28 ENCOUNTER — Encounter: Payer: Self-pay | Admitting: Orthopaedic Surgery

## 2022-05-28 DIAGNOSIS — Z96651 Presence of right artificial knee joint: Secondary | ICD-10-CM

## 2022-05-28 NOTE — Progress Notes (Signed)
Tony Morrison is now 6 weeks status post a right total knee arthroplasty.  He is lost weight since surgery.  His hemoglobin A1c is down to 5.6.  Physical therapy notes say that he is doing great and he says he is doing great.  On exam he walks with just a slight limp.  He has some mild swelling of his knee.  His extension is almost full and his flexion is almost full.  The knee feels limply stable.  From my standpoint I would have him only at go to maybe 1 or 2 more physical therapy visits since he is doing so well and is transition to home exercise program.  If he needs any type of medications he will let us know.  From my standpoint I will see him back in 3 months with a standing AP and lateral of his right operative knee.

## 2022-06-03 ENCOUNTER — Ambulatory Visit: Payer: Medicare Other

## 2022-06-03 ENCOUNTER — Other Ambulatory Visit (HOSPITAL_COMMUNITY): Payer: Self-pay

## 2022-06-03 DIAGNOSIS — M25561 Pain in right knee: Secondary | ICD-10-CM

## 2022-06-03 DIAGNOSIS — R600 Localized edema: Secondary | ICD-10-CM

## 2022-06-03 DIAGNOSIS — R2689 Other abnormalities of gait and mobility: Secondary | ICD-10-CM

## 2022-06-03 NOTE — Therapy (Signed)
OUTPATIENT PHYSICAL THERAPY TREATMENT NOTE   Patient Name: Tony Morrison MRN: [id number] DOB:03/30/1956, 66 y.o., male Today's Date: 06/03/2022  PCP:  Antony Haste, MD           REFERRING PROVIDER: Doneen Poisson, MD    END OF SESSION:   PT End of Session - 06/03/22 1050     Visit Number 10    Date for PT Re-Evaluation 06/24/22    Authorization Type [employer] Medicare    Progress Note Due on Visit 10    PT Start Time 1016    PT Stop Time 1052    PT Time Calculation (min) 36 min    Activity Tolerance Patient tolerated treatment well    Behavior During Therapy [facility] for tasks assessed/performed                     Past Medical History:  Diagnosis Date   Anemia    Anxiety    Arthritis    Chest pressure    cardiac cath; minimal non-obs CAD and normal EF 06/2008   Coronary artery disease    Depression    Diabetes mellitus    x5 years  type 2   Family history of anesthesia complication    mother n/v   GERD (gastroesophageal reflux disease)    Glaucoma    no per pt   Headache(784.0)     headaches   Cluster   Heart murmur    slight per pcp that is retired [name]  no issues    History of peptic ulcer disease    Hyperlipidemia    Hypertension    s/p syncopal episode with facial trauma due to orthostasis 10/2008. diuretic held   Hypothyroidism    Obesity    PONV (postoperative nausea and vomiting)    once   Sleep apnea    dont use CPAP   Syncopal episodes    likely due to orthostatic hypotension   Past Surgical History:  Procedure Laterality Date   left knee surgery     medial meniscus repair x2   MOUTH SURGERY     to make palate wider   NASAL HEMORRHAGE CONTROL N/A 03/24/2013   Procedure: EPISTAXIS CONTROL;  Surgeon: [name], MD;  Location: [facility] OR;  Service: ENT;  Laterality: N/A;   NASAL SEPTOPLASTY W/ TURBINOPLASTY N/A 03/24/2013   Procedure: NASAL SEPTOPLASTY WITH TURBINATE REDUCTION;  Surgeon: [name], MD;  Location: [facility]  OR;  Service: ENT;  Laterality: N/A;   NECK SURGERY     C5-C6   TOTAL KNEE ARTHROPLASTY Right 04/03/2022   Procedure: RIGHT KNEE ARTHROPLASTY;  Surgeon: Kathryne Hitch, MD;  Location: [facility] ORS;  Service: Orthopedics;  Laterality: Right;   VASECTOMY     Patient Active Problem List   Diagnosis Date Noted   Status post total right knee replacement 04/03/2022   Unilateral primary osteoarthritis, right knee 11/23/2019   OBESITY-MORBID (>100') 07/30/2009   SYNCOPE AND COLLAPSE 10/29/2008   OBSTRUCTIVE SLEEP APNEA 09/28/2008   HYPERLIPIDEMIA-MIXED 07/12/2008   HYPERTENSION, BENIGN 07/12/2008   CHEST PAIN-UNSPECIFIED 07/12/2008    REFERRING DIAG: s/p Rt TKA  THERAPY DIAG:  Acute pain of right knee  Localized edema  Other abnormalities of gait and mobility  Rationale for Evaluation and Treatment Rehabilitation  PERTINENT HISTORY: S/p Rt TKA (04/03/22), COPD, hypothyroid, anemia    PRECAUTIONS: None  SUBJECTIVE:  I saw the MD and he said 1-2 more sessions and I will be done.  PAIN:  Are you having pain? No NPRS scale: 0/10, some pain with standing > 1 hour Pain location: Rt knee  Pain orientation: Right  PAIN TYPE: aching and ore  Pain description: intermittent  Aggravating factors: standing, being too still, sometimes random Relieving factors: ice, stretching, rest    OBJECTIVE: (objective measures completed at initial evaluation unless otherwise dated)  DIAGNOSTIC FINDINGS: NA   PATIENT SURVEYS:  FOTO 29 (goal is 75) 05/27/22: FOTO 56    COGNITION:           Overall cognitive status: Within functional limits for tasks assessed                          SENSATION: WFL   EDEMA:  Circumferential: Lt mid patellar: 17 inches, Rt 18.25 inches    MUSCLE LENGTH: Hamstring flexibility limited on Rt due to quad lad   POSTURE: rounded shoulders and forward head   PALPATION: Reduced scar mobility over distal 1/2 of scar.  Skin is dry and peeling around  incision.  Edema and pain bilaterally.    LOWER EXTREMITY ROM:   Active ROM Right eval Left eval Right 05/01/22 Right  05/27/22  Hip flexion Limited by 25%      Hip extension        Hip abduction        Hip adduction        Hip internal rotation        Hip external rotation        Knee flexion 105   118 118  Knee extension 8      Ankle dorsiflexion full      Ankle plantarflexion        Ankle inversion        Ankle eversion         (Blank rows = not tested)   LOWER EXTREMITY MMT:   MMT Right eval Right 05/27/22  Hip flexion 4 4+  Hip extension     Hip abduction     Hip adduction 4+   Hip internal rotation     Hip external rotation     Knee flexion 4 4+  Knee extension 4 with lag 5   Ankle dorsiflexion 4+ 5  Ankle plantarflexion     Ankle inversion     Ankle eversion      (Blank rows = not tested)                       Lt hip and knee: 4+/5 GAIT: Distance walked: 50 Assistive device utilized: None Level of assistance: Modified independence Comments: ER at Rt hip, antalgic with reduced time spent in stance on Rt and reduced heel strike    TODAY'S TREATMENT: 06/03/22: Nustep L3 x 10 min with PTA present to discuss status Weight shifting 3 ways x 1 min each on balance pad Hurdles: step over step forward and sidestepping over hurdles in // bars Sit to stand with 5# kettlebell 2x10 6" step-ups  2x10 Seated hamstring stretch 3x20 seconds  Rockerboard standing 20x AROM, then static hold 20 sec 2x No game ready today due to reduced edema overall.   05/27/22: Nustep L3 x 10 min with PTA present to discuss status Weight shifting 3 ways x 1 min each Heel raises on mini tramp 2x10 VC to contract quads Rockerboard standing 20x AROM, then static hold 20 sec 2x LAQ with ball squeeze 5# 2x10 Game Ready 10 min  3 flakes Rt knee  05/25/22: Nustep L3 x 10 min with PTA present to discuss status Weight shifting 3 ways x 1 min each Heel raises on mini tramp 2x10 VC to  contract quads Hurdles: side stepping 15x with Vc to flex hip &knee more VS DF ankle Rockerboard standing 20x AROM, then static hold 20 sec 2x Leg press: seat 7, Bil legs 90# x25, 55# Rt only x20 LAQ with ball squeeze 5# 2x10 Game Ready 10 min 3 flakes Rt knee    PATIENT EDUCATION:  Education details: scar massage, review of HEP issued by home health Person educated: Patient Education method: Explanation, Demonstration, and Handouts Education comprehension: verbalized understanding and returned demonstration     HOME EXERCISE PROGRAM: Review of HEP issued by home health PT   ASSESSMENT:   CLINICAL IMPRESSION: Pt is making steady progress s/p TKA.  He saw MD last week and he was pleased with progress.  Pt demonstrated very minimal antalgia on level surface today.  He did well with balance tasks and strength progression today.  Pt will D/C to HEP next session.    OBJECTIVE IMPAIRMENTS decreased activity tolerance, decreased balance, decreased endurance, difficulty walking, decreased ROM, decreased strength, hypomobility, increased edema, impaired flexibility, and pain.    ACTIVITY LIMITATIONS lifting, bending, squatting, stairs, transfers, and locomotion level   PARTICIPATION LIMITATIONS: meal prep, driving, shopping, community activity, and yard work   PERSONAL FACTORS 1 comorbidity: s/p TKA  are also affecting patient's functional outcome.    REHAB POTENTIAL: Excellent   CLINICAL DECISION MAKING: Stable/uncomplicated   EVALUATION COMPLEXITY: Low     GOALS: Goals reviewed with patient? Yes   SHORT TERM GOALS: Target date: 05/27/2022  Be independent in initial HEP Baseline: Goal status: Goal met 05/01/22   2.  Improve Rt LE strength to ascend steps with step-over-step gait pattern and use of 1 rail Baseline: alternating pattern with 1 rail (05/20/22) Goal status: MET   3.  Demonstrate symmetry with gait on level surfaces without device  Baseline: mild antalgia without  device  Goal status: In progress    4.  Reduce Rt knee pain to allow for standing > or = to 25 minutes without limitation  Baseline: 30-45 minutes (05/27/22) Goal status: MET   5.  Demonstrate > or = to 110 Rt knee flexion to improve squatting and negotiating steps  Baseline: 105 Goal status: INITIAL       LONG TERM GOALS: Target date: 06/24/2022    Be independent in advanced HEP Baseline:  Goal status: in progress    2.  Improve FOTO to > or = to 75 Baseline: 56 Goal status: in progress    3.  Reduce Rt knee pain and improve endurance to stand and walk > or = to 30-45 minutes for community activity. Baseline: 30 min -45- variable (05/27/22) Goal status: INITIAL   4.  Improve Rt knee strength and ROM to negotiate steps with step-over-step gait and use of 1 rail Baseline: reduced control with descending (05/20/22) Goal status: In progess   5.  Demonstrate > or = to  115-120 degrees of Rt knee flexion to squat for home tasks  Baseline: 118 (05/27/22) Goal status: MET        PLAN: PT FREQUENCY: 2x/week   PT DURATION: 8 weeks   PLANNED INTERVENTIONS: Therapeutic exercises, Therapeutic activity, Neuromuscular re-education, Balance training, Gait training, Patient/Family education, Self Care, Joint mobilization, Stair training, Aquatic Therapy, Dry Needling, Electrical stimulation, Cryotherapy, Moist heat, scar mobilization, Taping,  Vasopneumatic device, Manual therapy, and Re-evaluation   PLAN FOR NEXT SESSION: D/C PT next session to HEP.  Finalize HEP, FOTO to assess goal.  Measurements for ROM and strength.   Lorrene Reid, PT 06/03/22 10:51 AM   [facility] [address] [place], [place] [address] Phone # [phone] Fax [phone]

## 2022-06-04 ENCOUNTER — Other Ambulatory Visit (HOSPITAL_COMMUNITY): Payer: Self-pay

## 2022-06-05 ENCOUNTER — Ambulatory Visit: Payer: Medicare Other | Admitting: Physical Therapy

## 2022-06-05 ENCOUNTER — Encounter: Payer: Self-pay | Admitting: Physical Therapy

## 2022-06-05 DIAGNOSIS — M25561 Pain in right knee: Secondary | ICD-10-CM | POA: Diagnosis not present

## 2022-06-05 DIAGNOSIS — R600 Localized edema: Secondary | ICD-10-CM

## 2022-06-05 DIAGNOSIS — R2689 Other abnormalities of gait and mobility: Secondary | ICD-10-CM

## 2022-06-05 NOTE — Therapy (Addendum)
OUTPATIENT PHYSICAL THERAPY TREATMENT NOTE   Patient Name: Tony Morrison MRN: [id number] DOB:[birth year], 66 y.o., male Today's Date: 06/05/2022  PCP:  Antony Haste, MD           REFERRING PROVIDER: Doneen Poisson, MD    END OF SESSION:   PT End of Session - 06/05/22 1014     Visit Number 11    Date for PT Re-Evaluation 06/24/22    Authorization Type [employer] Medicare    Progress Note Due on Visit 10    PT Start Time 1014    PT Stop Time 1044    PT Time Calculation (min) 30 min    Activity Tolerance Patient tolerated treatment well    Behavior During Therapy [facility] for tasks assessed/performed                      Past Medical History:  Diagnosis Date   Anemia    Anxiety    Arthritis    Chest pressure    cardiac cath; minimal non-obs CAD and normal EF 06/2008   Coronary artery disease    Depression    Diabetes mellitus    x5 years  type 2   Family history of anesthesia complication    mother n/v   GERD (gastroesophageal reflux disease)    Glaucoma    no per pt   Headache(784.0)     headaches   Cluster   Heart murmur    slight per pcp that is retired [name]  no issues    History of peptic ulcer disease    Hyperlipidemia    Hypertension    s/p syncopal episode with facial trauma due to orthostasis 10/2008. diuretic held   Hypothyroidism    Obesity    PONV (postoperative nausea and vomiting)    once   Sleep apnea    dont use CPAP   Syncopal episodes    likely due to orthostatic hypotension   Past Surgical History:  Procedure Laterality Date   left knee surgery     medial meniscus repair x2   MOUTH SURGERY     to make palate wider   NASAL HEMORRHAGE CONTROL N/A 03/24/2013   Procedure: EPISTAXIS CONTROL;  Surgeon: [name], MD;  Location: [facility] OR;  Service: ENT;  Laterality: N/A;   NASAL SEPTOPLASTY W/ TURBINOPLASTY N/A 03/24/2013   Procedure: NASAL SEPTOPLASTY WITH TURBINATE REDUCTION;  Surgeon: [name], MD;  Location:  [facility] OR;  Service: ENT;  Laterality: N/A;   NECK SURGERY     C5-C6   TOTAL KNEE ARTHROPLASTY Right 04/03/2022   Procedure: RIGHT KNEE ARTHROPLASTY;  Surgeon: Kathryne Hitch, MD;  Location: [facility] ORS;  Service: Orthopedics;  Laterality: Right;   VASECTOMY     Patient Active Problem List   Diagnosis Date Noted   Status post total right knee replacement 04/03/2022   Unilateral primary osteoarthritis, right knee 11/23/2019   OBESITY-MORBID (>100') 07/30/2009   SYNCOPE AND COLLAPSE 10/29/2008   OBSTRUCTIVE SLEEP APNEA 09/28/2008   HYPERLIPIDEMIA-MIXED 07/12/2008   HYPERTENSION, BENIGN 07/12/2008   CHEST PAIN-UNSPECIFIED 07/12/2008    REFERRING DIAG: s/p Rt TKA  THERAPY DIAG:  Acute pain of right knee  Localized edema  Other abnormalities of gait and mobility  Rationale for Evaluation and Treatment Rehabilitation  PERTINENT HISTORY: S/p Rt TKA (04/03/22), COPD, hypothyroid, anemia    PRECAUTIONS: None  SUBJECTIVE:  I am very pleased with my progess. PAIN:  Are you having pain? No NPRS scale: 0/10,  some pain with standing > 1 hour Pain location: Rt knee  Pain orientation: Right  PAIN TYPE: aching and ore  Pain description: intermittent  Aggravating factors: standing, being too still, sometimes random Relieving factors: ice, stretching, rest    OBJECTIVE: (objective measures completed at initial evaluation unless otherwise dated)  DIAGNOSTIC FINDINGS: NA   PATIENT SURVEYS:  FOTO 51 (goal is 64) 05/27/22: FOTO 56  FOTO 06/05/22 69   COGNITION:           Overall cognitive status: Within functional limits for tasks assessed                          SENSATION: WFL   EDEMA:  Circumferential: Lt mid patellar: 17 inches, Rt 18.25 inches    MUSCLE LENGTH: Hamstring flexibility limited on Rt due to quad lad   POSTURE: rounded shoulders and forward head   PALPATION: Reduced scar mobility over distal 1/2 of scar.  Skin is dry and peeling around incision.  Edema  and pain bilaterally.    LOWER EXTREMITY ROM:   Active ROM Right eval Left eval Right 05/01/22 Right  05/27/22 Right 06/05/22  Hip flexion Limited by 25%       Hip extension         Hip abduction         Hip adduction         Hip internal rotation         Hip external rotation         Knee flexion 105   118 118 120  Knee extension 8       Ankle dorsiflexion full       Ankle plantarflexion         Ankle inversion         Ankle eversion          (Blank rows = not tested)   LOWER EXTREMITY MMT:   MMT Right eval Right 05/27/22 Right  06/05/22  Hip flexion 4 4+ 5  Hip extension      Hip abduction      Hip adduction 4+    Hip internal rotation      Hip external rotation      Knee flexion 4 4+ 5  Knee extension 4 with lag 5  5  Ankle dorsiflexion 4+ 5 5  Ankle plantarflexion      Ankle inversion      Ankle eversion       (Blank rows = not tested)                       Lt hip and knee: 4+/5 GAIT: Distance walked: 50 Assistive device utilized: None Level of assistance: Modified independence Comments: ER at Rt hip, antalgic with reduced time spent in stance on Rt and reduced heel strike    TODAY'S TREATMENT:   06/05/22: Nustep L3 x 10 min with PTA present to discuss status FOTO MMT/ROM Hamstring stretch 30 sec 3x 10# KB sit to stand 2x10 VC to have = WB 6" step ups 15x no UE 6" step downs 10x with light UE on the LT Slant board gastroc/ham stretch 3x 20 sec VC for technique   06/03/22: Nustep L3 x 10 min with PTA present to discuss status Weight shifting 3 ways x 1 min each on balance pad Hurdles: step over step forward and sidestepping over hurdles in // bars Sit to stand with 5# kettlebell  2x10 6" step-ups  2x10 Seated hamstring stretch 3x20 seconds  Rockerboard standing 20x AROM, then static hold 20 sec 2x No game ready today due to reduced edema overall.   05/27/22: Nustep L3 x 10 min with PTA present to discuss status Weight shifting 3 ways x 1 min  each Heel raises on mini tramp 2x10 VC to contract quads Rockerboard standing 20x AROM, then static hold 20 sec 2x LAQ with ball squeeze 5# 2x10 Game Ready 10 min 3 flakes Rt knee  05/25/22: Nustep L3 x 10 min with PTA present to discuss status Weight shifting 3 ways x 1 min each Heel raises on mini tramp 2x10 VC to contract quads Hurdles: side stepping 15x with Vc to flex hip &knee more VS DF ankle Rockerboard standing 20x AROM, then static hold 20 sec 2x Leg press: seat 7, Bil legs 90# x25, 55# Rt only x20 LAQ with ball squeeze 5# 2x10 Game Ready 10 min 3 flakes Rt knee    PATIENT EDUCATION:  Education details: scar massage, review of HEP issued by home health Person educated: Patient Education method: Explanation, Demonstration, and Handouts Education comprehension: verbalized understanding and returned demonstration     HOME EXERCISE PROGRAM: Review of HEP issued by home health PT   ASSESSMENT:   CLINICAL IMPRESSION: Pt arrives with no pain. FOTO improved but didn't quite meet the goal. Pt's AROM met goal and pt is satisfied with his functional activities. Pt did not have any questions going further.   OBJECTIVE IMPAIRMENTS decreased activity tolerance, decreased balance, decreased endurance, difficulty walking, decreased ROM, decreased strength, hypomobility, increased edema, impaired flexibility, and pain.    ACTIVITY LIMITATIONS lifting, bending, squatting, stairs, transfers, and locomotion level   PARTICIPATION LIMITATIONS: meal prep, driving, shopping, community activity, and yard work   PERSONAL FACTORS 1 comorbidity: s/p TKA  are also affecting patient's functional outcome.    REHAB POTENTIAL: Excellent   CLINICAL DECISION MAKING: Stable/uncomplicated   EVALUATION COMPLEXITY: Low     GOALS: Goals reviewed with patient? Yes   SHORT TERM GOALS: Target date: 05/27/2022  Be independent in initial HEP Baseline: Goal status: Goal met 05/01/22   2.  Improve  Rt LE strength to ascend steps with step-over-step gait pattern and use of 1 rail Baseline: alternating pattern with 1 rail (05/20/22) Goal status: MET   3.  Demonstrate symmetry with gait on level surfaces without device  Baseline: mild antalgia without device  Goal status: Goal met 06/05/22  4.  Reduce Rt knee pain to allow for standing > or = to 25 minutes without limitation  Baseline: 30-45 minutes (05/27/22) Goal status: MET   5.  Demonstrate > or = to 110 Rt knee flexion to improve squatting and negotiating steps  Baseline: 105 Goal status: Goal met 06/05/22      LONG TERM GOALS: Target date: 06/24/2022    Be independent in advanced HEP Baseline:  Goal status: Goal met 06/05/22 //  2.  Improve FOTO to > or = to 75 Baseline:  Goal status: Not met but improved   3.  Reduce Rt knee pain and improve endurance to stand and walk > or = to 30-45 minutes for community activity. Baseline: 30 min -45- variable (05/27/22) Goal status: Goal met 06/05/22    4.  Improve Rt knee strength and ROM to negotiate steps with step-over-step gait and use of 1 rail Baseline: reduced control with descending (05/20/22) Goal status: Goal met 06/05/22   5.  Demonstrate > or =  to  115-120 degrees of Rt knee flexion to squat for home tasks  Baseline: 118 (05/27/22) Goal status: MET        PLAN: D/C PT to HEP  Ane Payment, PTA 06/05/22 10:45 AM  PHYSICAL THERAPY DISCHARGE SUMMARY  Visits from Start of Care: 11  Current functional level related to goals / functional outcomes: See above for current status.  Pt will continue with walking and strength/flexibility exercises for continued improved function.     Remaining deficits: No significant functional deficits.     Education / Equipment: HEP   Patient agrees to discharge. Patient goals were partially met. Patient is being discharged due to being pleased with the current functional level.  Lorrene Reid, PT 06/05/22 5:36 PM    [facility] [address] [place], [place] [address] Phone # [phone] Fax [phone]

## 2022-06-12 ENCOUNTER — Encounter: Payer: Medicare Other | Admitting: Physical Therapy

## 2022-06-15 ENCOUNTER — Other Ambulatory Visit (HOSPITAL_COMMUNITY): Payer: Self-pay

## 2022-06-15 MED ORDER — ONETOUCH VERIO VI STRP
ORAL_STRIP | 2 refills | Status: DC
  Filled 2022-06-15: qty 100, 33d supply, fill #0

## 2022-06-17 ENCOUNTER — Other Ambulatory Visit (HOSPITAL_COMMUNITY): Payer: Self-pay

## 2022-06-18 ENCOUNTER — Other Ambulatory Visit (HOSPITAL_COMMUNITY): Payer: Self-pay

## 2022-06-19 ENCOUNTER — Other Ambulatory Visit (HOSPITAL_COMMUNITY): Payer: Self-pay

## 2022-06-19 ENCOUNTER — Encounter: Payer: Medicare Other | Admitting: Physical Therapy

## 2022-06-24 ENCOUNTER — Other Ambulatory Visit: Payer: Self-pay | Admitting: Orthopaedic Surgery

## 2022-06-24 ENCOUNTER — Other Ambulatory Visit (HOSPITAL_COMMUNITY): Payer: Self-pay

## 2022-06-24 ENCOUNTER — Ambulatory Visit: Payer: Medicare Other

## 2022-06-24 MED ORDER — TIZANIDINE HCL 4 MG PO TABS
4.0000 mg | ORAL_TABLET | Freq: Four times a day (QID) | ORAL | 0 refills | Status: DC | PRN
  Filled 2022-06-24: qty 30, 8d supply, fill #0

## 2022-06-24 MED ORDER — HYDROCODONE-ACETAMINOPHEN 10-325 MG PO TABS
1.0000 | ORAL_TABLET | ORAL | 0 refills | Status: DC | PRN
  Filled 2022-06-24: qty 30, 5d supply, fill #0

## 2022-06-26 ENCOUNTER — Encounter: Payer: Medicare Other | Admitting: Physical Therapy

## 2022-06-30 ENCOUNTER — Other Ambulatory Visit (HOSPITAL_COMMUNITY): Payer: Self-pay

## 2022-06-30 MED ORDER — AMLODIPINE BESYLATE 5 MG PO TABS
5.0000 mg | ORAL_TABLET | Freq: Every day | ORAL | 3 refills | Status: DC
  Filled 2022-06-30: qty 30, 30d supply, fill #0
  Filled 2022-08-10: qty 30, 30d supply, fill #1
  Filled 2022-09-07: qty 30, 30d supply, fill #2
  Filled 2022-10-07: qty 30, 30d supply, fill #3

## 2022-06-30 MED ORDER — ROSUVASTATIN CALCIUM 5 MG PO TABS
5.0000 mg | ORAL_TABLET | Freq: Every day | ORAL | 3 refills | Status: DC
  Filled 2022-06-30: qty 30, 30d supply, fill #0
  Filled 2022-08-06 – 2022-08-07 (×2): qty 30, 30d supply, fill #1
  Filled 2022-08-27 – 2022-08-31 (×2): qty 30, 30d supply, fill #2
  Filled 2022-09-30: qty 30, 30d supply, fill #3

## 2022-07-01 ENCOUNTER — Other Ambulatory Visit (HOSPITAL_COMMUNITY): Payer: Self-pay

## 2022-07-08 ENCOUNTER — Other Ambulatory Visit (HOSPITAL_COMMUNITY): Payer: Self-pay

## 2022-07-08 MED ORDER — ALPRAZOLAM 0.5 MG PO TABS
ORAL_TABLET | ORAL | 0 refills | Status: DC
  Filled 2022-07-08: qty 30, 10d supply, fill #0

## 2022-07-08 MED ORDER — TIZANIDINE HCL 4 MG PO TABS
ORAL_TABLET | ORAL | 0 refills | Status: DC
  Filled 2022-07-08: qty 30, 8d supply, fill #0

## 2022-07-09 ENCOUNTER — Other Ambulatory Visit (HOSPITAL_COMMUNITY): Payer: Self-pay

## 2022-07-13 ENCOUNTER — Other Ambulatory Visit (HOSPITAL_COMMUNITY): Payer: Self-pay

## 2022-07-14 ENCOUNTER — Other Ambulatory Visit (HOSPITAL_COMMUNITY): Payer: Self-pay

## 2022-07-14 ENCOUNTER — Encounter: Payer: Self-pay | Admitting: Orthopaedic Surgery

## 2022-07-14 ENCOUNTER — Other Ambulatory Visit: Payer: Self-pay | Admitting: Orthopaedic Surgery

## 2022-07-14 MED ORDER — HYDROCODONE-ACETAMINOPHEN 10-325 MG PO TABS
1.0000 | ORAL_TABLET | ORAL | 0 refills | Status: DC | PRN
  Filled 2022-07-14: qty 30, 5d supply, fill #0

## 2022-07-14 MED ORDER — TIZANIDINE HCL 4 MG PO TABS
4.0000 mg | ORAL_TABLET | Freq: Three times a day (TID) | ORAL | 0 refills | Status: DC | PRN
  Filled 2022-07-14: qty 30, 10d supply, fill #0

## 2022-07-23 ENCOUNTER — Encounter: Payer: Self-pay | Admitting: Internal Medicine

## 2022-07-28 ENCOUNTER — Other Ambulatory Visit (HOSPITAL_COMMUNITY): Payer: Self-pay

## 2022-07-28 ENCOUNTER — Other Ambulatory Visit: Payer: Self-pay | Admitting: Orthopaedic Surgery

## 2022-07-29 ENCOUNTER — Other Ambulatory Visit (HOSPITAL_COMMUNITY): Payer: Self-pay

## 2022-07-29 MED ORDER — TIZANIDINE HCL 4 MG PO TABS
4.0000 mg | ORAL_TABLET | Freq: Three times a day (TID) | ORAL | 0 refills | Status: DC | PRN
  Filled 2022-07-29: qty 30, 10d supply, fill #0

## 2022-07-29 MED ORDER — PANTOPRAZOLE SODIUM 40 MG PO TBEC
40.0000 mg | DELAYED_RELEASE_TABLET | Freq: Every day | ORAL | 3 refills | Status: DC
  Filled 2022-07-29: qty 90, 90d supply, fill #0
  Filled 2022-10-27: qty 30, 30d supply, fill #1
  Filled 2023-02-01: qty 30, 30d supply, fill #2
  Filled 2023-03-08: qty 30, 30d supply, fill #3
  Filled 2023-04-16: qty 30, 30d supply, fill #4
  Filled 2023-05-24: qty 30, 30d supply, fill #5
  Filled 2023-06-22: qty 30, 30d supply, fill #6

## 2022-07-29 MED ORDER — JANUMET XR 50-1000 MG PO TB24
1.0000 | ORAL_TABLET | Freq: Every day | ORAL | 3 refills | Status: DC
  Filled 2022-07-29: qty 30, 30d supply, fill #0
  Filled 2022-08-27: qty 30, 30d supply, fill #1
  Filled 2022-09-30: qty 30, 30d supply, fill #2
  Filled 2022-10-27: qty 30, 30d supply, fill #3

## 2022-07-29 MED ORDER — CARVEDILOL 25 MG PO TABS
25.0000 mg | ORAL_TABLET | Freq: Two times a day (BID) | ORAL | 3 refills | Status: DC
  Filled 2022-07-29: qty 180, 90d supply, fill #0
  Filled 2022-10-22 – 2022-10-24 (×2): qty 180, 90d supply, fill #1
  Filled 2023-01-17: qty 180, 90d supply, fill #2
  Filled 2023-04-25: qty 180, 90d supply, fill #3

## 2022-07-30 ENCOUNTER — Other Ambulatory Visit (HOSPITAL_COMMUNITY): Payer: Self-pay

## 2022-07-31 ENCOUNTER — Other Ambulatory Visit (HOSPITAL_COMMUNITY): Payer: Self-pay

## 2022-08-04 ENCOUNTER — Other Ambulatory Visit (HOSPITAL_COMMUNITY): Payer: Self-pay

## 2022-08-04 ENCOUNTER — Other Ambulatory Visit: Payer: Self-pay

## 2022-08-04 MED ORDER — UNIFINE PENTIPS 31G X 8 MM MISC
1 refills | Status: DC
  Filled 2022-08-04: qty 100, 90d supply, fill #0

## 2022-08-04 MED ORDER — UNIFINE PENTIPS 31G X 8 MM MISC
1 refills | Status: AC
  Filled 2022-08-04: qty 100, 90d supply, fill #0
  Filled 2022-11-24: qty 100, 30d supply, fill #0

## 2022-08-04 MED ORDER — ONETOUCH VERIO VI STRP
ORAL_STRIP | 2 refills | Status: DC
  Filled 2022-08-04: qty 100, 33d supply, fill #0

## 2022-08-07 ENCOUNTER — Other Ambulatory Visit (HOSPITAL_COMMUNITY): Payer: Self-pay

## 2022-08-11 ENCOUNTER — Other Ambulatory Visit (HOSPITAL_COMMUNITY): Payer: Self-pay

## 2022-08-14 ENCOUNTER — Other Ambulatory Visit (HOSPITAL_COMMUNITY): Payer: Self-pay

## 2022-08-14 ENCOUNTER — Other Ambulatory Visit: Payer: Self-pay | Admitting: Orthopaedic Surgery

## 2022-08-14 MED ORDER — TIZANIDINE HCL 4 MG PO TABS
4.0000 mg | ORAL_TABLET | Freq: Three times a day (TID) | ORAL | 0 refills | Status: DC | PRN
  Filled 2022-08-14: qty 30, 10d supply, fill #0

## 2022-08-14 MED ORDER — HYDROCODONE-ACETAMINOPHEN 10-325 MG PO TABS
1.0000 | ORAL_TABLET | ORAL | 0 refills | Status: DC | PRN
  Filled 2022-08-14: qty 30, 5d supply, fill #0

## 2022-08-15 ENCOUNTER — Other Ambulatory Visit (HOSPITAL_COMMUNITY): Payer: Self-pay

## 2022-08-17 ENCOUNTER — Other Ambulatory Visit (HOSPITAL_COMMUNITY): Payer: Self-pay

## 2022-08-28 ENCOUNTER — Other Ambulatory Visit (HOSPITAL_COMMUNITY): Payer: Self-pay

## 2022-08-28 ENCOUNTER — Telehealth: Payer: Self-pay | Admitting: Oncology

## 2022-08-28 ENCOUNTER — Other Ambulatory Visit: Payer: Self-pay

## 2022-08-28 NOTE — Telephone Encounter (Signed)
Contacted patient to inform them of providers departures multiple times. Voicemail was left.

## 2022-08-29 ENCOUNTER — Other Ambulatory Visit (HOSPITAL_COMMUNITY): Payer: Self-pay

## 2022-08-31 ENCOUNTER — Other Ambulatory Visit: Payer: Self-pay

## 2022-09-01 ENCOUNTER — Other Ambulatory Visit: Payer: Self-pay

## 2022-09-06 ENCOUNTER — Other Ambulatory Visit: Payer: Self-pay | Admitting: Orthopaedic Surgery

## 2022-09-06 ENCOUNTER — Encounter: Payer: Self-pay | Admitting: Orthopaedic Surgery

## 2022-09-07 ENCOUNTER — Other Ambulatory Visit (HOSPITAL_COMMUNITY): Payer: Self-pay

## 2022-09-07 MED ORDER — TIZANIDINE HCL 4 MG PO TABS
4.0000 mg | ORAL_TABLET | Freq: Three times a day (TID) | ORAL | 0 refills | Status: DC | PRN
  Filled 2022-09-07: qty 30, 10d supply, fill #0

## 2022-09-07 MED ORDER — HYDROCODONE-ACETAMINOPHEN 10-325 MG PO TABS
1.0000 | ORAL_TABLET | ORAL | 0 refills | Status: DC | PRN
  Filled 2022-09-07: qty 30, 5d supply, fill #0

## 2022-09-08 ENCOUNTER — Other Ambulatory Visit (HOSPITAL_COMMUNITY): Payer: Self-pay

## 2022-09-08 MED ORDER — FENOFIBRATE 160 MG PO TABS: 160.0000 mg | ORAL_TABLET | Freq: Every day | ORAL | 5 refills | Status: DC

## 2022-09-08 MED ORDER — FENOFIBRATE 160 MG PO TABS
160.0000 mg | ORAL_TABLET | Freq: Every day | ORAL | 5 refills | Status: DC
  Filled 2022-09-08: qty 30, 30d supply, fill #0
  Filled 2022-10-07: qty 30, 30d supply, fill #1
  Filled 2022-11-06: qty 30, 30d supply, fill #2
  Filled 2022-12-12: qty 30, 30d supply, fill #3
  Filled 2023-01-11: qty 30, 30d supply, fill #4
  Filled 2023-02-12: qty 30, 30d supply, fill #5

## 2022-09-21 ENCOUNTER — Encounter: Payer: Self-pay | Admitting: Orthopaedic Surgery

## 2022-09-23 ENCOUNTER — Other Ambulatory Visit (HOSPITAL_COMMUNITY): Payer: Self-pay

## 2022-09-23 MED ORDER — LANTUS SOLOSTAR 100 UNIT/ML ~~LOC~~ SOPN
70.0000 [IU] | PEN_INJECTOR | Freq: Every morning | SUBCUTANEOUS | 11 refills | Status: DC
  Filled 2022-09-23: qty 18, 25d supply, fill #0
  Filled 2022-10-18: qty 18, 25d supply, fill #1
  Filled 2022-11-13: qty 18, 25d supply, fill #2
  Filled 2022-12-12: qty 18, 25d supply, fill #3
  Filled 2023-01-05: qty 18, 25d supply, fill #4
  Filled 2023-02-01: qty 18, 25d supply, fill #5
  Filled 2023-02-25: qty 18, 25d supply, fill #6
  Filled 2023-03-28: qty 18, 25d supply, fill #7

## 2022-09-24 ENCOUNTER — Other Ambulatory Visit (HOSPITAL_COMMUNITY): Payer: Self-pay

## 2022-09-24 MED ORDER — LANTUS SOLOSTAR 100 UNIT/ML ~~LOC~~ SOPN
70.0000 [IU] | PEN_INJECTOR | Freq: Every morning | SUBCUTANEOUS | 11 refills | Status: AC
  Filled 2022-09-24 – 2023-04-26 (×2): qty 18, 25d supply, fill #0
  Filled 2023-05-31: qty 18, 25d supply, fill #1
  Filled 2023-07-03: qty 18, 25d supply, fill #2
  Filled 2023-07-31: qty 18, 25d supply, fill #3
  Filled 2023-08-31: qty 18, 25d supply, fill #4

## 2022-09-26 ENCOUNTER — Other Ambulatory Visit (HOSPITAL_COMMUNITY): Payer: Self-pay

## 2022-09-26 MED ORDER — ALPRAZOLAM 0.5 MG PO TABS
0.5000 mg | ORAL_TABLET | Freq: Three times a day (TID) | ORAL | 0 refills | Status: DC | PRN
  Filled 2022-09-26: qty 30, 10d supply, fill #0

## 2022-10-01 ENCOUNTER — Other Ambulatory Visit: Payer: Self-pay

## 2022-10-01 ENCOUNTER — Other Ambulatory Visit (HOSPITAL_COMMUNITY): Payer: Self-pay

## 2022-10-02 ENCOUNTER — Other Ambulatory Visit (HOSPITAL_COMMUNITY): Payer: Self-pay

## 2022-10-06 ENCOUNTER — Ambulatory Visit (INDEPENDENT_AMBULATORY_CARE_PROVIDER_SITE_OTHER): Payer: Medicare Other | Admitting: Orthopaedic Surgery

## 2022-10-06 ENCOUNTER — Encounter: Payer: Self-pay | Admitting: Orthopaedic Surgery

## 2022-10-06 ENCOUNTER — Ambulatory Visit (INDEPENDENT_AMBULATORY_CARE_PROVIDER_SITE_OTHER): Payer: Medicare Other

## 2022-10-06 DIAGNOSIS — Z96651 Presence of right artificial knee joint: Secondary | ICD-10-CM

## 2022-10-06 NOTE — Progress Notes (Signed)
Tony Morrison is a 67 year old gentleman well-known to me.  He is now 6 months out from a right total knee arthroplasty.  He says he has sharp pains at times with it is not frequent.  He has been taken hydrocodone and Zanaflex on occasion.  He is retired now.  He has no big concerns at all.  He looks great from my standpoint.  His blood glucose is also under really good control.  Examination of his right knee shows range of motion is entirely full and there is no significant swelling.  His knee is ligamentously stable.  2 views of the right knee show well-seated total knee arthroplasty with no complicating features.  At this point follow-up can be as needed for his knee since he is doing so well.  He knows to contact us if there are any issues at all.

## 2022-10-07 ENCOUNTER — Other Ambulatory Visit (HOSPITAL_COMMUNITY): Payer: Self-pay

## 2022-10-08 ENCOUNTER — Other Ambulatory Visit (HOSPITAL_COMMUNITY): Payer: Self-pay

## 2022-10-08 MED ORDER — HYDROCODONE-ACETAMINOPHEN 10-325 MG PO TABS
ORAL_TABLET | ORAL | 0 refills | Status: DC
  Filled 2022-10-08: qty 30, 8d supply, fill #0

## 2022-10-08 MED ORDER — TIZANIDINE HCL 4 MG PO TABS
ORAL_TABLET | ORAL | 0 refills | Status: DC
  Filled 2022-10-08: qty 30, 8d supply, fill #0

## 2022-10-08 MED ORDER — GABAPENTIN 100 MG PO CAPS
100.0000 mg | ORAL_CAPSULE | Freq: Three times a day (TID) | ORAL | 2 refills | Status: DC
  Filled 2022-10-08: qty 90, 30d supply, fill #0

## 2022-10-08 MED ORDER — FENOFIBRATE 160 MG PO TABS
160.0000 mg | ORAL_TABLET | Freq: Every day | ORAL | 5 refills | Status: DC
  Filled 2022-10-08: qty 30, 30d supply, fill #0

## 2022-10-08 MED ORDER — GLIMEPIRIDE 4 MG PO TABS
4.0000 mg | ORAL_TABLET | Freq: Two times a day (BID) | ORAL | 3 refills | Status: DC
  Filled 2022-10-08: qty 60, 30d supply, fill #0
  Filled 2022-12-03: qty 60, 30d supply, fill #1
  Filled 2023-01-11: qty 60, 30d supply, fill #2
  Filled 2023-02-12: qty 60, 30d supply, fill #3

## 2022-10-09 ENCOUNTER — Other Ambulatory Visit (HOSPITAL_COMMUNITY): Payer: Self-pay

## 2022-10-15 ENCOUNTER — Encounter: Payer: Self-pay | Admitting: Radiology

## 2022-10-22 ENCOUNTER — Other Ambulatory Visit (HOSPITAL_COMMUNITY): Payer: Self-pay

## 2022-10-23 ENCOUNTER — Other Ambulatory Visit (HOSPITAL_COMMUNITY): Payer: Self-pay

## 2022-10-23 MED ORDER — HYDROCHLOROTHIAZIDE 25 MG PO TABS
ORAL_TABLET | ORAL | 3 refills | Status: DC
  Filled 2022-10-23: qty 45, 90d supply, fill #0
  Filled 2023-01-17: qty 45, 90d supply, fill #1
  Filled 2023-04-25: qty 45, 90d supply, fill #2
  Filled 2023-07-31: qty 45, 90d supply, fill #3

## 2022-10-24 ENCOUNTER — Other Ambulatory Visit (HOSPITAL_COMMUNITY): Payer: Self-pay

## 2022-10-27 ENCOUNTER — Other Ambulatory Visit (HOSPITAL_COMMUNITY): Payer: Self-pay

## 2022-10-27 ENCOUNTER — Other Ambulatory Visit: Payer: Self-pay | Admitting: Orthopaedic Surgery

## 2022-10-28 ENCOUNTER — Other Ambulatory Visit (HOSPITAL_COMMUNITY): Payer: Self-pay

## 2022-10-28 ENCOUNTER — Other Ambulatory Visit: Payer: Self-pay

## 2022-10-28 MED ORDER — TIZANIDINE HCL 4 MG PO TABS
4.0000 mg | ORAL_TABLET | Freq: Three times a day (TID) | ORAL | 0 refills | Status: DC | PRN
  Filled 2022-10-28: qty 30, 10d supply, fill #0

## 2022-10-28 MED ORDER — TIZANIDINE HCL 4 MG PO TABS
ORAL_TABLET | ORAL | 0 refills | Status: DC
  Filled 2022-10-28: qty 30, 30d supply, fill #0
  Filled 2022-12-14: qty 30, 8d supply, fill #0

## 2022-10-28 MED ORDER — GABAPENTIN 100 MG PO CAPS
100.0000 mg | ORAL_CAPSULE | Freq: Three times a day (TID) | ORAL | 2 refills | Status: AC
  Filled 2022-10-28: qty 90, 30d supply, fill #0

## 2022-10-28 MED ORDER — HYDROCODONE-ACETAMINOPHEN 10-325 MG PO TABS
1.0000 | ORAL_TABLET | ORAL | 0 refills | Status: DC | PRN
  Filled 2022-10-28: qty 30, 5d supply, fill #0

## 2022-10-28 MED ORDER — ROSUVASTATIN CALCIUM 5 MG PO TABS
5.0000 mg | ORAL_TABLET | Freq: Every day | ORAL | 3 refills | Status: DC
  Filled 2022-10-28: qty 30, 30d supply, fill #0
  Filled 2022-12-03: qty 30, 30d supply, fill #1
  Filled 2022-12-30: qty 30, 30d supply, fill #2
  Filled 2023-02-01: qty 30, 30d supply, fill #3

## 2022-11-02 ENCOUNTER — Other Ambulatory Visit (HOSPITAL_COMMUNITY): Payer: Self-pay

## 2022-11-04 ENCOUNTER — Other Ambulatory Visit (HOSPITAL_COMMUNITY): Payer: Self-pay

## 2022-11-06 ENCOUNTER — Other Ambulatory Visit (HOSPITAL_COMMUNITY): Payer: Self-pay

## 2022-11-07 ENCOUNTER — Other Ambulatory Visit (HOSPITAL_COMMUNITY): Payer: Self-pay

## 2022-11-07 MED ORDER — AMLODIPINE BESYLATE 5 MG PO TABS
5.0000 mg | ORAL_TABLET | Freq: Every day | ORAL | 3 refills | Status: DC
  Filled 2022-11-07: qty 30, 30d supply, fill #0
  Filled 2022-12-12: qty 30, 30d supply, fill #1
  Filled 2023-01-11: qty 30, 30d supply, fill #2
  Filled 2023-02-12: qty 30, 30d supply, fill #3

## 2022-11-08 ENCOUNTER — Other Ambulatory Visit (HOSPITAL_COMMUNITY): Payer: Self-pay

## 2022-11-08 MED ORDER — ALPRAZOLAM 0.5 MG PO TABS
ORAL_TABLET | ORAL | 0 refills | Status: DC
  Filled 2022-11-08: qty 30, 10d supply, fill #0

## 2022-11-08 MED ORDER — AMLODIPINE BESYLATE 5 MG PO TABS
5.0000 mg | ORAL_TABLET | Freq: Every day | ORAL | 3 refills | Status: DC
  Filled 2022-11-08: qty 30, 30d supply, fill #0

## 2022-11-09 ENCOUNTER — Other Ambulatory Visit: Payer: Self-pay

## 2022-11-09 ENCOUNTER — Other Ambulatory Visit (HOSPITAL_COMMUNITY): Payer: Self-pay

## 2022-11-10 ENCOUNTER — Other Ambulatory Visit (HOSPITAL_COMMUNITY): Payer: Self-pay

## 2022-11-16 ENCOUNTER — Other Ambulatory Visit (HOSPITAL_COMMUNITY): Payer: Self-pay

## 2022-11-17 ENCOUNTER — Other Ambulatory Visit (HOSPITAL_COMMUNITY): Payer: Self-pay

## 2022-11-17 MED ORDER — INSULIN LISPRO (1 UNIT DIAL) 100 UNIT/ML (KWIKPEN)
PEN_INJECTOR | SUBCUTANEOUS | 3 refills | Status: DC
  Filled 2022-11-17: qty 9, 30d supply, fill #0
  Filled 2022-12-23: qty 9, 30d supply, fill #1
  Filled 2023-01-17: qty 9, 30d supply, fill #2
  Filled 2023-02-15: qty 9, 30d supply, fill #3
  Filled 2023-03-18: qty 9, 30d supply, fill #4
  Filled 2023-04-25: qty 9, 30d supply, fill #5
  Filled 2023-05-25: qty 6, 20d supply, fill #6

## 2022-11-23 ENCOUNTER — Other Ambulatory Visit (HOSPITAL_COMMUNITY): Payer: Self-pay

## 2022-11-24 ENCOUNTER — Other Ambulatory Visit (HOSPITAL_COMMUNITY): Payer: Self-pay

## 2022-11-24 MED ORDER — LISINOPRIL 40 MG PO TABS
40.0000 mg | ORAL_TABLET | Freq: Every day | ORAL | 11 refills | Status: DC
  Filled 2022-11-24: qty 90, 90d supply, fill #0
  Filled 2023-03-01: qty 90, 90d supply, fill #1

## 2022-11-24 MED ORDER — LISINOPRIL 40 MG PO TABS
40.0000 mg | ORAL_TABLET | Freq: Every day | ORAL | 3 refills | Status: DC
  Filled 2022-11-24 – 2023-06-08 (×2): qty 30, 30d supply, fill #0
  Filled 2023-07-11: qty 30, 30d supply, fill #1
  Filled 2023-08-08: qty 30, 30d supply, fill #2
  Filled 2023-09-07: qty 30, 30d supply, fill #3
  Filled 2023-10-11: qty 30, 30d supply, fill #4
  Filled 2023-11-11: qty 30, 30d supply, fill #5

## 2022-11-24 MED ORDER — JANUMET XR 50-1000 MG PO TB24
1.0000 | ORAL_TABLET | Freq: Every day | ORAL | 3 refills | Status: DC
  Filled 2022-11-24: qty 30, 30d supply, fill #0
  Filled 2022-12-23: qty 30, 30d supply, fill #1
  Filled 2023-01-17: qty 30, 30d supply, fill #2
  Filled 2023-02-25: qty 30, 30d supply, fill #3

## 2022-11-24 MED ORDER — JANUMET XR 50-1000 MG PO TB24
1.0000 | ORAL_TABLET | Freq: Every day | ORAL | 3 refills | Status: DC
  Filled 2022-11-24: qty 30, 30d supply, fill #0

## 2022-12-03 ENCOUNTER — Other Ambulatory Visit (HOSPITAL_COMMUNITY): Payer: Self-pay

## 2022-12-10 ENCOUNTER — Other Ambulatory Visit (HOSPITAL_COMMUNITY): Payer: Self-pay

## 2022-12-10 ENCOUNTER — Other Ambulatory Visit: Payer: Self-pay | Admitting: Orthopaedic Surgery

## 2022-12-10 MED ORDER — HYDROCODONE-ACETAMINOPHEN 10-325 MG PO TABS
1.0000 | ORAL_TABLET | ORAL | 0 refills | Status: DC | PRN
  Filled 2022-12-10: qty 30, 5d supply, fill #0

## 2022-12-14 ENCOUNTER — Other Ambulatory Visit (HOSPITAL_COMMUNITY): Payer: Self-pay

## 2022-12-15 ENCOUNTER — Other Ambulatory Visit (HOSPITAL_COMMUNITY): Payer: Self-pay

## 2022-12-15 ENCOUNTER — Other Ambulatory Visit: Payer: Self-pay

## 2022-12-24 ENCOUNTER — Other Ambulatory Visit (HOSPITAL_COMMUNITY): Payer: Self-pay

## 2023-01-11 ENCOUNTER — Other Ambulatory Visit (HOSPITAL_COMMUNITY): Payer: Self-pay

## 2023-01-11 MED ORDER — ALPRAZOLAM 0.5 MG PO TABS
0.5000 mg | ORAL_TABLET | Freq: Three times a day (TID) | ORAL | 0 refills | Status: AC | PRN
  Filled 2023-01-11: qty 30, 10d supply, fill #0

## 2023-01-12 ENCOUNTER — Other Ambulatory Visit (HOSPITAL_COMMUNITY): Payer: Self-pay

## 2023-01-12 ENCOUNTER — Other Ambulatory Visit: Payer: Self-pay

## 2023-01-12 MED ORDER — LEVOTHYROXINE SODIUM 50 MCG PO TABS
50.0000 ug | ORAL_TABLET | Freq: Every day | ORAL | 5 refills | Status: DC
  Filled 2023-01-12: qty 30, 30d supply, fill #0
  Filled 2023-02-12: qty 30, 30d supply, fill #1
  Filled 2023-03-18: qty 30, 30d supply, fill #2
  Filled 2023-04-16: qty 30, 30d supply, fill #3
  Filled 2023-05-24: qty 30, 30d supply, fill #4
  Filled 2023-07-03: qty 30, 30d supply, fill #5

## 2023-01-25 IMAGING — CT CT HEART MORP W/ CTA COR W/ SCORE W/ CA W/CM &/OR W/O CM
1 series · 12 of 14 positions shown, 15 images · non-contrast
Comparison: None.

Addendum:
CLINICAL DATA: Chest pain

EXAM:
Cardiac/Coronary CTA
TECHNIQUE: A non-contrast, gated CT scan was obtained with axial slices of 3 mm
through the heart for calcium scoring. Calcium scoring was performed
using the Agatston method. A 120 kV retrospective, gated, contrast
cardiac scan was obtained. Gantry rotation speed was 250 msecs and
collimation was 0.6 mm. Two sublingual nitroglycerin tablets (0.8
mg) were given. The 3D data set was reconstructed in 5% intervals of
the 0-95% of the R-R cycle. Diastolic phases were analyzed on a
dedicated workstation using MPR, MIP, and VRT modes. The patient
received 95 cc of contrast.

[Series 3655: findings · 0.69mm/px · 12 of 14 slices shown, 15 images]
[im 2/14  vessel]
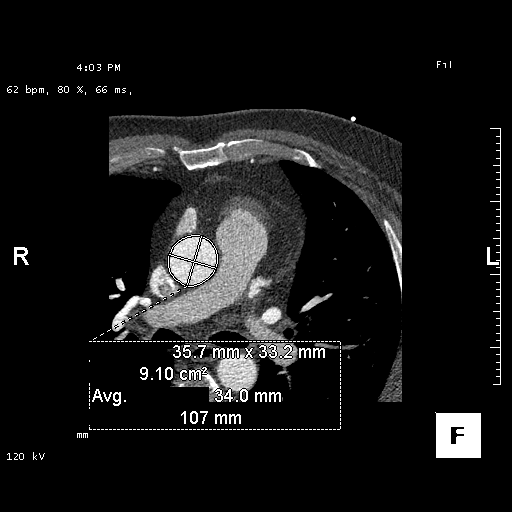
[im 2/14  lung]
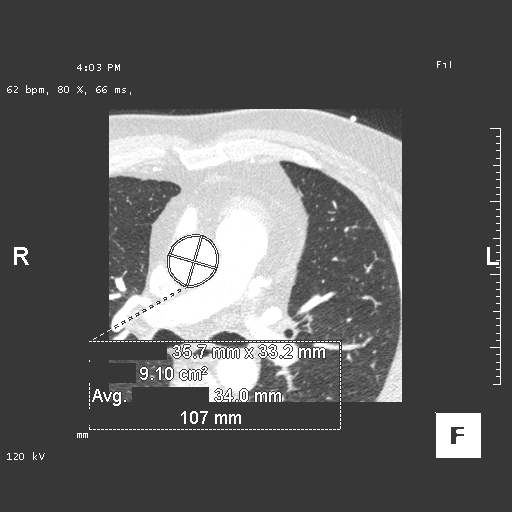
[im 3/14  vessel]
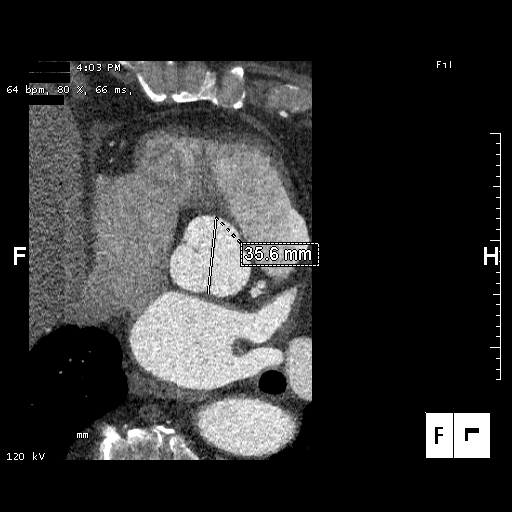
[im 4/14  vessel]
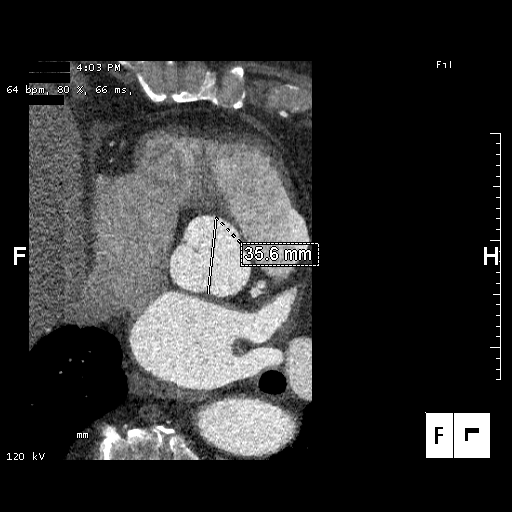
[im 5/14  vessel]
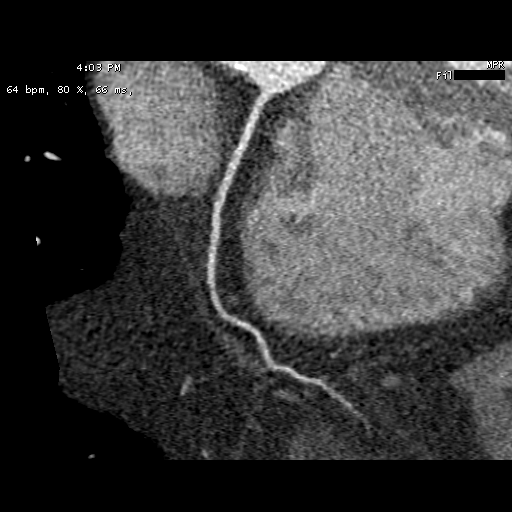
[im 6/14  vessel]
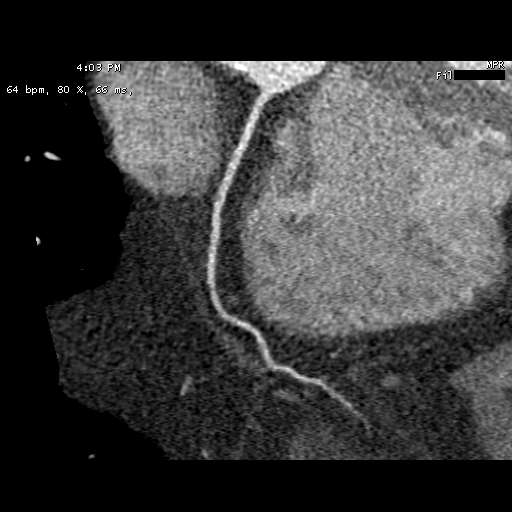
[im 6/14  lung]
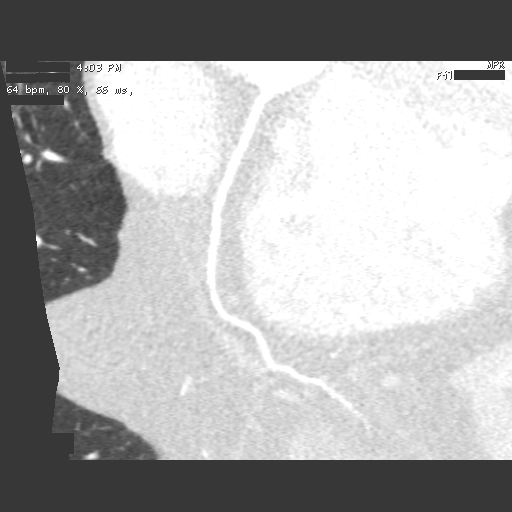
[im 7/14  vessel]
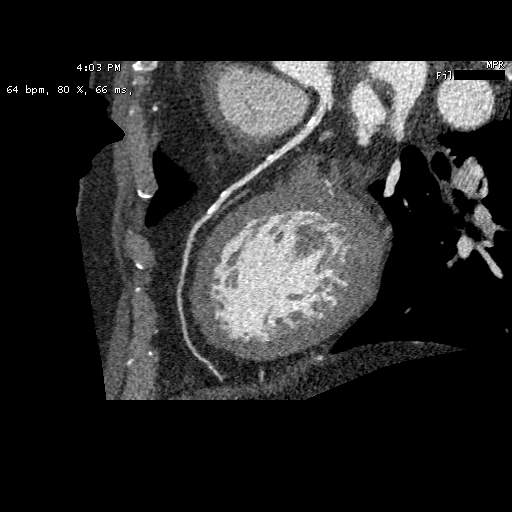
[im 8/14  vessel]
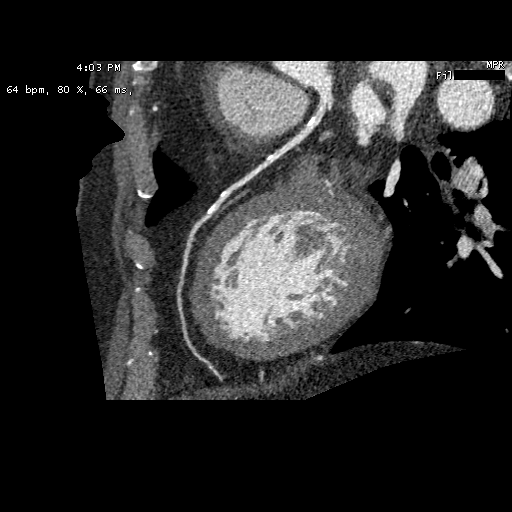
[im 9/14  vessel]
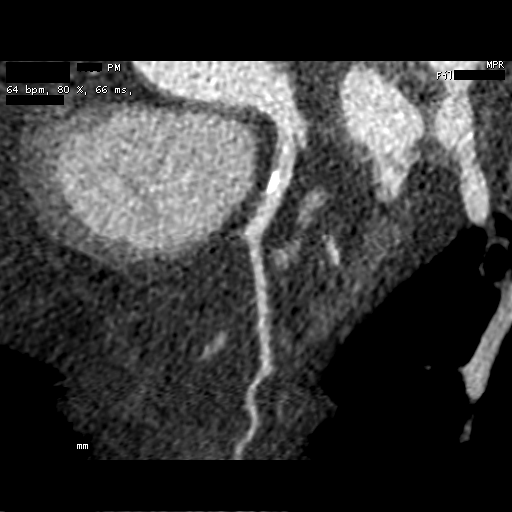
[im 10/14  vessel]
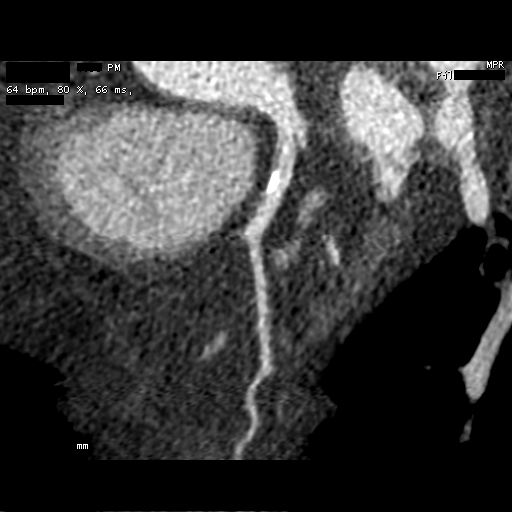
[im 10/14  lung]
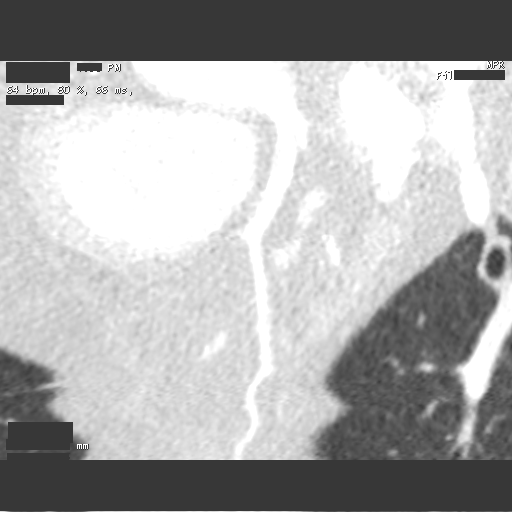
[im 11/14  vessel]
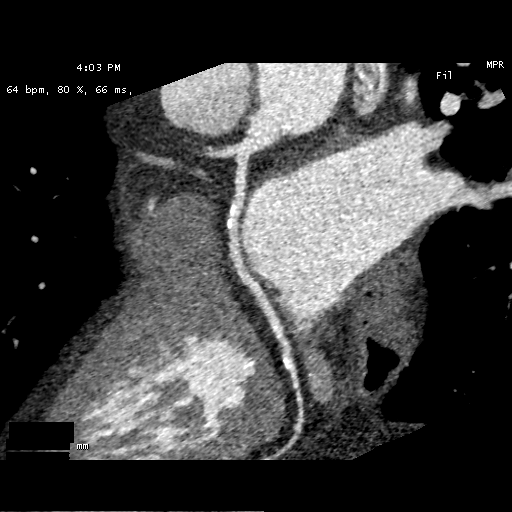
[im 12/14  vessel]
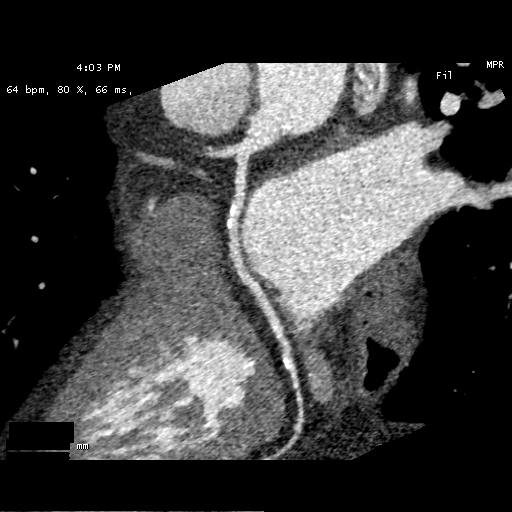
[im 13/14  vessel]
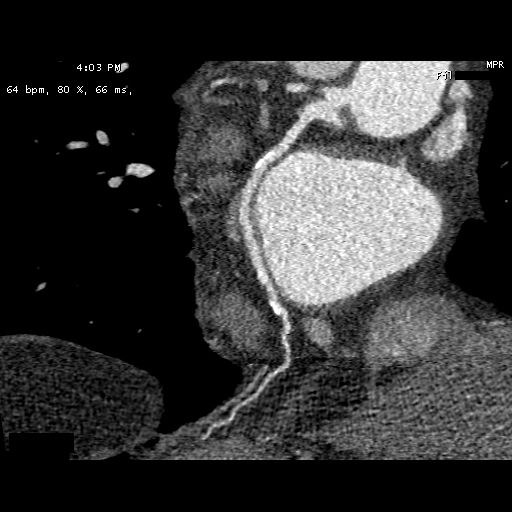

[12 of 14 positions shown; findings below may reference images not displayed]

FINDINGS: Image quality: Excellent.

Noise artifact is: Limited.

Coronary Arteries:  Normal coronary origin.  Left dominance.

Left main: The left main is a large caliber vessel with a normal
take off from the left coronary cusp that bifurcates to form a left
anterior descending artery and a left circumflex artery. There is no
plaque or stenosis.

Left anterior descending artery: The proximal and mid LAD segments
contain mild calcified plaque (25-49%). The distal LAD is patent.
The LAD gives off 1 patent diagonal branch.

Left circumflex artery: The LCX is dominant. There is mild calcified
plaque (25-49%) in the mid and distal LCX. The LCX gives off 3 OM
branches that are patent. The LCX terminates as a patent PDA.

Right coronary artery: The RCA is non-dominant with normal take off
from the right coronary cusp. There is no evidence of plaque or
stenosis.

Right Atrium: Right atrial size is within normal limits.

Right Ventricle: The right ventricular cavity is within normal
limits.

Left Atrium: Left atrial size is normal in size with no left atrial
appendage filling defect.

Left Ventricle: The ventricular cavity size is within normal limits.

Pulmonary arteries: Normal in size without proximal filling defect.

Pulmonary veins: Normal pulmonary venous drainage.

Pericardium: Normal thickness without significant effusion or
calcium present.

Cardiac valves: The aortic valve is trileaflet without significant
calcification. The mitral valve is normal without significant
calcification.

Aorta: Normal caliber without significant disease.

Extra-cardiac findings: See attached radiology report for
non-cardiac structures.
IMPRESSION: 1. Coronary calcium score of 335. This was 76th percentile for age-,
sex, and race-matched controls.

2. Normal coronary origin with left dominance.

3. Mild calcified plaque (25-49%) in the LAD/LCX.

RECOMMENDATIONS:
1. Mild non-obstructive CAD (25-49%). Consider non-atherosclerotic
causes of chest pain. Consider preventive therapy and risk factor
modification.

ADDENDUM:
The following report is an over-read performed by radiologist Dr.
over-read does not include interpretation of cardiac or coronary
anatomy or pathology. The coronary calcium score/coronary CTA
interpretation by the cardiologist is attached.
FINDINGS: Vascular: No acute non-cardiac vascular finding.

Mediastinum/Nodes: No pathologically enlarged mediastinal, or hilar
lymph nodes in the acquired field-of-view. Visualized portions of
the esophagus are grossly unremarkable

Lungs/Pleura: Within the visualized portions of the thorax there are
no suspicious appearing pulmonary nodules or masses, there is no
acuteconsolidative airspace disease, no pleural effusions and no
pneumothorax

Upper Abdomen: Visualized portions of the upper abdomen are
unremarkable.

Musculoskeletal: There are no aggressive appearing lytic or blastic
lesions noted in the visualized portions of the skeleton.
IMPRESSION: No significant incidental noncardiac finding noted.

*** End of Addendum ***
FINDINGS: Image quality: Excellent.

Noise artifact is: Limited.

Coronary Arteries:  Normal coronary origin.  Left dominance.

Left main: The left main is a large caliber vessel with a normal
take off from the left coronary cusp that bifurcates to form a left
anterior descending artery and a left circumflex artery. There is no
plaque or stenosis.

Left anterior descending artery: The proximal and mid LAD segments
contain mild calcified plaque (25-49%). The distal LAD is patent.
The LAD gives off 1 patent diagonal branch.

Left circumflex artery: The LCX is dominant. There is mild calcified
plaque (25-49%) in the mid and distal LCX. The LCX gives off 3 OM
branches that are patent. The LCX terminates as a patent PDA.

Right coronary artery: The RCA is non-dominant with normal take off
from the right coronary cusp. There is no evidence of plaque or
stenosis.

Right Atrium: Right atrial size is within normal limits.

Right Ventricle: The right ventricular cavity is within normal
limits.

Left Atrium: Left atrial size is normal in size with no left atrial
appendage filling defect.

Left Ventricle: The ventricular cavity size is within normal limits.

Pulmonary arteries: Normal in size without proximal filling defect.

Pulmonary veins: Normal pulmonary venous drainage.

Pericardium: Normal thickness without significant effusion or
calcium present.

Cardiac valves: The aortic valve is trileaflet without significant
calcification. The mitral valve is normal without significant
calcification.

Aorta: Normal caliber without significant disease.

Extra-cardiac findings: See attached radiology report for
non-cardiac structures.
IMPRESSION: 1. Coronary calcium score of 335. This was 76th percentile for age-,
sex, and race-matched controls.

2. Normal coronary origin with left dominance.

3. Mild calcified plaque (25-49%) in the LAD/LCX.

RECOMMENDATIONS:
1. Mild non-obstructive CAD (25-49%). Consider non-atherosclerotic
causes of chest pain. Consider preventive therapy and risk factor
modification.

## 2023-02-01 ENCOUNTER — Other Ambulatory Visit (HOSPITAL_COMMUNITY): Payer: Self-pay

## 2023-02-01 MED ORDER — FLUOXETINE HCL 40 MG PO CAPS
40.0000 mg | ORAL_CAPSULE | Freq: Every day | ORAL | 3 refills | Status: DC
  Filled 2023-02-01: qty 30, 30d supply, fill #0
  Filled 2023-03-01: qty 30, 30d supply, fill #1
  Filled 2023-04-01: qty 30, 30d supply, fill #2
  Filled 2023-05-05: qty 30, 30d supply, fill #3
  Filled 2023-06-07: qty 30, 30d supply, fill #4
  Filled 2023-07-11: qty 30, 30d supply, fill #5
  Filled 2023-08-08: qty 30, 30d supply, fill #6
  Filled 2023-09-07: qty 30, 30d supply, fill #7
  Filled 2023-10-11: qty 30, 30d supply, fill #8
  Filled 2023-11-11: qty 30, 30d supply, fill #9
  Filled 2023-12-06: qty 30, 30d supply, fill #10
  Filled 2024-01-11: qty 30, 30d supply, fill #11

## 2023-02-04 ENCOUNTER — Other Ambulatory Visit (HOSPITAL_COMMUNITY): Payer: Self-pay

## 2023-02-04 MED ORDER — TIZANIDINE HCL 4 MG PO TABS
4.0000 mg | ORAL_TABLET | Freq: Four times a day (QID) | ORAL | 0 refills | Status: DC | PRN
  Filled 2023-02-04: qty 30, 8d supply, fill #0

## 2023-02-14 ENCOUNTER — Other Ambulatory Visit (HOSPITAL_COMMUNITY): Payer: Self-pay

## 2023-02-15 ENCOUNTER — Other Ambulatory Visit (HOSPITAL_COMMUNITY): Payer: Self-pay

## 2023-02-15 ENCOUNTER — Telehealth: Payer: Self-pay | Admitting: Internal Medicine

## 2023-02-15 MED ORDER — ONETOUCH VERIO VI STRP
ORAL_STRIP | 2 refills | Status: DC
  Filled 2023-02-15: qty 100, 33d supply, fill #0
  Filled 2023-05-31: qty 100, 33d supply, fill #1
  Filled 2023-08-31: qty 100, 33d supply, fill #2

## 2023-02-15 NOTE — Telephone Encounter (Signed)
Hi, Tony Morrison.  It looks like we've reach out to the patient to schedule in Nov, 2023 & Dec, 2023 - we even mailed the pt a letter. Please reach out to the patient to schedule the HST.  I can send the order to [facility] if patient prefers.  Please let me know what the patient decides.

## 2023-02-15 NOTE — Telephone Encounter (Signed)
Pt. Was on recall and needs to be set up for HST

## 2023-02-23 ENCOUNTER — Ambulatory Visit: Payer: Medicare Other

## 2023-02-23 DIAGNOSIS — G4733 Obstructive sleep apnea (adult) (pediatric): Secondary | ICD-10-CM

## 2023-03-01 ENCOUNTER — Other Ambulatory Visit (HOSPITAL_COMMUNITY): Payer: Self-pay

## 2023-03-02 ENCOUNTER — Other Ambulatory Visit (HOSPITAL_COMMUNITY): Payer: Self-pay

## 2023-03-02 MED ORDER — ROSUVASTATIN CALCIUM 5 MG PO TABS
5.0000 mg | ORAL_TABLET | Freq: Every day | ORAL | 3 refills | Status: DC
  Filled 2023-03-02: qty 30, 30d supply, fill #0
  Filled 2023-04-01: qty 30, 30d supply, fill #1
  Filled 2023-05-05: qty 30, 30d supply, fill #2
  Filled 2023-06-07: qty 30, 30d supply, fill #3

## 2023-03-03 DIAGNOSIS — G4733 Obstructive sleep apnea (adult) (pediatric): Secondary | ICD-10-CM | POA: Diagnosis not present

## 2023-03-09 ENCOUNTER — Other Ambulatory Visit (HOSPITAL_COMMUNITY): Payer: Self-pay

## 2023-03-10 ENCOUNTER — Other Ambulatory Visit: Payer: Self-pay | Admitting: Orthopaedic Surgery

## 2023-03-10 ENCOUNTER — Other Ambulatory Visit (HOSPITAL_COMMUNITY): Payer: Self-pay

## 2023-03-10 MED ORDER — HYDROCODONE-ACETAMINOPHEN 10-325 MG PO TABS
1.0000 | ORAL_TABLET | ORAL | 0 refills | Status: AC | PRN
  Filled 2023-03-10: qty 30, 5d supply, fill #0

## 2023-03-18 ENCOUNTER — Other Ambulatory Visit (HOSPITAL_COMMUNITY): Payer: Self-pay

## 2023-03-19 ENCOUNTER — Other Ambulatory Visit (HOSPITAL_COMMUNITY): Payer: Self-pay

## 2023-03-21 ENCOUNTER — Other Ambulatory Visit (HOSPITAL_COMMUNITY): Payer: Self-pay

## 2023-03-21 MED ORDER — GLIMEPIRIDE 4 MG PO TABS
4.0000 mg | ORAL_TABLET | Freq: Two times a day (BID) | ORAL | 3 refills | Status: DC
  Filled 2023-03-21: qty 60, 30d supply, fill #0
  Filled 2023-04-16: qty 60, 30d supply, fill #1
  Filled 2023-05-24: qty 60, 30d supply, fill #2
  Filled 2023-06-22: qty 60, 30d supply, fill #3

## 2023-03-21 MED ORDER — FENOFIBRATE 160 MG PO TABS
160.0000 mg | ORAL_TABLET | Freq: Every day | ORAL | 5 refills | Status: AC
  Filled 2023-03-21: qty 30, 30d supply, fill #0
  Filled 2023-04-16: qty 30, 30d supply, fill #1
  Filled 2023-05-24: qty 30, 30d supply, fill #2
  Filled 2023-06-22: qty 30, 30d supply, fill #3
  Filled 2023-07-20 – 2023-07-26 (×2): qty 30, 30d supply, fill #4
  Filled 2023-08-18: qty 30, 30d supply, fill #5

## 2023-03-21 MED ORDER — AMLODIPINE BESYLATE 5 MG PO TABS
5.0000 mg | ORAL_TABLET | Freq: Every day | ORAL | 3 refills | Status: DC
  Filled 2023-03-21: qty 30, 30d supply, fill #0
  Filled 2023-04-16: qty 30, 30d supply, fill #1
  Filled 2023-05-24: qty 30, 30d supply, fill #2
  Filled 2023-07-03: qty 30, 30d supply, fill #3

## 2023-03-22 ENCOUNTER — Other Ambulatory Visit: Payer: Self-pay

## 2023-03-22 ENCOUNTER — Other Ambulatory Visit (HOSPITAL_COMMUNITY): Payer: Self-pay

## 2023-04-01 ENCOUNTER — Other Ambulatory Visit (HOSPITAL_COMMUNITY): Payer: Self-pay

## 2023-04-02 ENCOUNTER — Other Ambulatory Visit (HOSPITAL_COMMUNITY): Payer: Self-pay

## 2023-04-02 MED ORDER — JANUMET XR 50-1000 MG PO TB24
1.0000 | ORAL_TABLET | Freq: Every day | ORAL | 3 refills | Status: DC
  Filled 2023-04-02: qty 30, 30d supply, fill #0
  Filled 2023-04-30: qty 30, 30d supply, fill #1
  Filled 2023-06-01: qty 30, 30d supply, fill #2
  Filled 2023-07-03: qty 30, 30d supply, fill #3

## 2023-04-07 NOTE — Progress Notes (Unsigned)
05/26/22- 28 yoM former smoker for sleep evaluation courtesy of Dr Antony Haste with concern of OSA. No longer using CPAP.  Medical problem list includes CAD, HTN, GERD, DM2, Hypothyroid, Depression, Hyperlipidemia, Glaucoma, Obesity, Syncopal Episodes, NPSG 10/03/08- AHI 4/ hr/ RDI 11/ hr, desaturation to 84% , body weight 257 lbs NPSG 10/06/13- AHI 15.9/ hr, desaturation to 80%, body weight 240 lbs CPAP - not used in several years Epworth score-1 Body weight today-251 lbs Covid vaxx- Flu vax- -------Has not used a CPAP machine in 7-8 years. Pts wife states he snores.  Told at knee surgery that he needed to get OSA treated again. ENT surgery for repair of old nasal fracture.  Denies heart or lung problems. Occasional Tylenol PM sleep aid. Has also used alprazolam.  Working as a [employer].  04/08/23- 67 yoM former smoker followed for OSA, complicated by   CAD, HTN, GERD, DM2, Hypothyroid, Depression, Hyperlipidemia, Glaucoma, Obesity, Syncopal Episodes, - Alprazolam 0.5, Prozac, Gabapentin, HST 03/03/23- AHI 15.8/hr, desaturation to 77%, body weight 251 lbs For treatment decision We discussed results of his sleep study and available treatments again.  He is agreeable to trying again with CPAP and we discussed mask choices and AutoPap. He and his primary physician have discussed bariatric surgery.  I discussed the ability of CPAP to flex with weight loss.  ROS-see HPI  + = positive Constitutional:    weight loss, night sweats, fevers, chills, fatigue, lassitude. HEENT:    headaches, difficulty swallowing, tooth/dental problems, sore throat,       sneezing, itching, ear ache, nasal congestion, post nasal drip, snoring CV:    chest pain, orthopnea, PND, swelling in lower extremities, anasarca,                dizziness, palpitations Resp:   shortness of breath with exertion or at rest.                productive cough,   non-productive cough, coughing up of blood.               change in color of mucus.  wheezing.   Skin:    rash or lesions. GI:  No-   heartburn, indigestion, abdominal pain, nausea, vomiting, diarrhea,                 change in bowel habits, loss of appetite GU: dysuria, change in color of urine, no urgency or frequency.   flank pain. MS:   joint pain, stiffness, decreased range of motion, back pain. Neuro-     nothing unusual Psych:  change in mood or affect.  depression or anxiety.   memory loss.  OBJ- Physical Exam General- Alert, Oriented, Affect-appropriate, Distress- none acute, + obese Skin- rash-none, lesions- none, excoriation- none Lymphadenopathy- none Head- atraumatic            Eyes- Gross vision intact, PERRLA, conjunctivae and secretions clear            Ears- Hearing, canals-normal            Nose- Clear, no-Septal dev, mucus, polyps, erosion, perforation             Throat- Mallampati III-IV , mucosa clear , drainage- none, tonsils- atrophic, +teeth Neck- flexible , trachea midline, no stridor , thyroid nl, carotid no bruit Chest - symmetrical excursion , unlabored           Heart/CV- RRR , no murmur , no gallop  , no rub, nl s1  s2                           - JVD- none , edema- none, stasis changes- none, varices- none           Lung- clear to P&A, wheeze- none, cough- none , dullness-none, rub- none           Chest wall-  Abd-  Br/ Gen/ Rectal- Not done, not indicated Extrem- cyanosis- none, clubbing, none, atrophy- none, strength- nl Neuro- grossly intact to observation

## 2023-04-08 ENCOUNTER — Ambulatory Visit (INDEPENDENT_AMBULATORY_CARE_PROVIDER_SITE_OTHER): Payer: Medicare Other | Admitting: Internal Medicine

## 2023-04-08 ENCOUNTER — Encounter: Payer: Self-pay | Admitting: Internal Medicine

## 2023-04-08 VITALS — BP 140/70 | HR 64 | Ht 68.0 in | Wt 263.8 lb

## 2023-04-08 DIAGNOSIS — G4733 Obstructive sleep apnea (adult) (pediatric): Secondary | ICD-10-CM | POA: Diagnosis not present

## 2023-04-08 NOTE — Patient Instructions (Signed)
Order- new DME, new CPAP auto 5-20, mask of choice, humidifier supplies, AirView/ card  Please call if we can help

## 2023-04-08 NOTE — Assessment & Plan Note (Signed)
Supporting efforts with diet/exercise and possibly with external approaches, towards normal body weight.

## 2023-04-08 NOTE — Assessment & Plan Note (Signed)
Plan-new CPAP auto 5-20

## 2023-04-09 ENCOUNTER — Other Ambulatory Visit (HOSPITAL_COMMUNITY): Payer: Self-pay

## 2023-04-13 ENCOUNTER — Other Ambulatory Visit (HOSPITAL_COMMUNITY): Payer: Self-pay

## 2023-04-13 MED ORDER — ALPRAZOLAM 0.5 MG PO TABS
0.5000 mg | ORAL_TABLET | Freq: Three times a day (TID) | ORAL | 0 refills | Status: AC | PRN
  Filled 2023-04-13: qty 60, 20d supply, fill #0

## 2023-04-14 ENCOUNTER — Other Ambulatory Visit (HOSPITAL_COMMUNITY): Payer: Self-pay

## 2023-04-26 ENCOUNTER — Other Ambulatory Visit (HOSPITAL_COMMUNITY): Payer: Self-pay

## 2023-04-26 MED ORDER — MOUNJARO 2.5 MG/0.5ML ~~LOC~~ SOAJ
2.5000 mg | SUBCUTANEOUS | 1 refills | Status: DC
  Filled 2023-04-26: qty 2, 28d supply, fill #0
  Filled 2023-05-31: qty 2, 28d supply, fill #1

## 2023-05-03 ENCOUNTER — Other Ambulatory Visit (HOSPITAL_COMMUNITY): Payer: Self-pay

## 2023-05-04 ENCOUNTER — Other Ambulatory Visit (HOSPITAL_COMMUNITY): Payer: Self-pay

## 2023-05-05 ENCOUNTER — Encounter: Payer: Self-pay | Admitting: Orthopaedic Surgery

## 2023-05-19 ENCOUNTER — Other Ambulatory Visit (INDEPENDENT_AMBULATORY_CARE_PROVIDER_SITE_OTHER): Payer: Medicare Other

## 2023-05-19 ENCOUNTER — Ambulatory Visit: Payer: Medicare Other | Admitting: Orthopaedic Surgery

## 2023-05-19 DIAGNOSIS — M1712 Unilateral primary osteoarthritis, left knee: Secondary | ICD-10-CM | POA: Diagnosis not present

## 2023-05-19 DIAGNOSIS — M25562 Pain in left knee: Secondary | ICD-10-CM

## 2023-05-19 MED ORDER — LIDOCAINE HCL 1 % IJ SOLN
3.0000 mL | INTRAMUSCULAR | Status: AC | PRN
Start: 2023-05-19 — End: 2023-05-19
  Administered 2023-05-19: 3 mL

## 2023-05-19 MED ORDER — METHYLPREDNISOLONE ACETATE 40 MG/ML IJ SUSP
40.0000 mg | INTRAMUSCULAR | Status: AC | PRN
Start: 2023-05-19 — End: 2023-05-19
  Administered 2023-05-19: 40 mg via INTRA_ARTICULAR

## 2023-05-19 NOTE — Progress Notes (Signed)
Tony Morrison is well-known to me.  We replaced his right knee in the past.  We have performed arthroscopic surgery in the remote past for his left knee.  He has been having a lot of medial joint line tenderness with that left knee as well as some stabbing and sharp pains at times.  This is been going on for about 2 weeks.  He has no known injury that he is aware of.  He denies any swelling.  He has no medications that has been taking otherwise for pain.  He is on Mounjaro for his diabetes and weight loss.  He has been working on better blood glucose control.  Examination of his left knee shows varus malalignment that is correctable.  There is medial joint line tenderness and a mildly positive McMurray's sign to the medial compartment the knee.  His knee otherwise feels ligamentously stable.  There is no lateral tenderness and no real patellofemoral crepitation.  2 views of the left knee show significant medial compartment arthritis with varus malalignment.  The rest of his knee looks okay.  I did recommend a steroid injection in his knee today which she agreed to and tolerated well.  He will watch his blood glucose closely.  If his knee is not improving he will give [place] a call because I would like to obtain a MRI of his left knee to assess the cartilage throughout the knee to make a determination of what the recommended surgical intervention would be in terms of a partial knee replacement versus a total knee replacement.  He agrees and will let us know.    Procedure Note  Patient: Tony Morrison             Date of Birth: 12-04-1955           MRN: [id number]             Visit Date: 05/19/2023  Procedures: Visit Diagnoses:  1. Acute pain of left knee   2. Unilateral primary osteoarthritis, left knee     Large Joint Inj: L knee on 05/19/2023 9:18 AM Indications: diagnostic evaluation and pain Details: 22 G 1.5 in needle, superolateral approach  Arthrogram: No  Medications: 3 mL lidocaine 1 %; 40  mg methylPREDNISolone acetate 40 MG/ML Outcome: tolerated well, no immediate complications Procedure, treatment alternatives, risks and benefits explained, specific risks discussed. Consent was given by the patient. Immediately prior to procedure a time out was called to verify the correct patient, procedure, equipment, support staff and site/side marked as required. Patient was prepped and draped in the usual sterile fashion.

## 2023-05-24 ENCOUNTER — Ambulatory Visit: Payer: Medicare Other | Admitting: Orthopaedic Surgery

## 2023-05-25 ENCOUNTER — Other Ambulatory Visit (HOSPITAL_COMMUNITY): Payer: Self-pay

## 2023-05-26 ENCOUNTER — Other Ambulatory Visit (HOSPITAL_COMMUNITY): Payer: Self-pay

## 2023-05-26 MED ORDER — TIZANIDINE HCL 4 MG PO TABS
4.0000 mg | ORAL_TABLET | Freq: Four times a day (QID) | ORAL | 0 refills | Status: DC | PRN
  Filled 2023-05-26: qty 30, 8d supply, fill #0

## 2023-05-26 MED ORDER — INSULIN LISPRO (1 UNIT DIAL) 100 UNIT/ML (KWIKPEN)
10.0000 [IU] | PEN_INJECTOR | Freq: Three times a day (TID) | SUBCUTANEOUS | 3 refills | Status: AC
  Filled 2023-05-26: qty 15, 50d supply, fill #0
  Filled 2023-06-08: qty 9, 30d supply, fill #0
  Filled 2024-01-07: qty 15, 50d supply, fill #0
  Filled 2024-05-20: qty 15, 50d supply, fill #1

## 2023-05-27 ENCOUNTER — Other Ambulatory Visit (HOSPITAL_COMMUNITY): Payer: Self-pay

## 2023-06-01 ENCOUNTER — Other Ambulatory Visit: Payer: Self-pay

## 2023-06-02 ENCOUNTER — Other Ambulatory Visit (HOSPITAL_COMMUNITY): Payer: Self-pay

## 2023-06-02 MED ORDER — BISACODYL 5 MG PO TBEC
DELAYED_RELEASE_TABLET | ORAL | 0 refills | Status: DC
  Filled 2023-06-02: qty 4, 1d supply, fill #0

## 2023-06-02 MED ORDER — PEG 3350-KCL-NA BICARB-NACL 420 G PO SOLR
ORAL | 0 refills | Status: DC
  Filled 2023-06-02: qty 4000, 1d supply, fill #0

## 2023-06-08 ENCOUNTER — Other Ambulatory Visit (HOSPITAL_COMMUNITY): Payer: Self-pay

## 2023-06-15 ENCOUNTER — Other Ambulatory Visit (HOSPITAL_COMMUNITY): Payer: Self-pay

## 2023-06-16 ENCOUNTER — Other Ambulatory Visit (HOSPITAL_COMMUNITY): Payer: Self-pay

## 2023-06-16 MED ORDER — INSULIN LISPRO (1 UNIT DIAL) 100 UNIT/ML (KWIKPEN)
10.0000 [IU] | PEN_INJECTOR | Freq: Three times a day (TID) | SUBCUTANEOUS | 3 refills | Status: AC
  Filled 2023-06-16: qty 15, 50d supply, fill #0
  Filled 2023-08-08: qty 15, 50d supply, fill #1
  Filled 2023-10-03: qty 15, 50d supply, fill #2
  Filled 2023-10-05: qty 9, 30d supply, fill #2
  Filled 2023-11-01: qty 9, 30d supply, fill #3
  Filled 2023-11-29 – 2023-11-30 (×2): qty 9, 30d supply, fill #4
  Filled 2024-01-07: qty 3, 10d supply, fill #5

## 2023-06-19 ENCOUNTER — Other Ambulatory Visit (HOSPITAL_COMMUNITY): Payer: Self-pay

## 2023-06-23 ENCOUNTER — Other Ambulatory Visit (HOSPITAL_COMMUNITY): Payer: Self-pay

## 2023-06-29 ENCOUNTER — Other Ambulatory Visit (HOSPITAL_COMMUNITY): Payer: Self-pay

## 2023-06-30 ENCOUNTER — Other Ambulatory Visit (HOSPITAL_COMMUNITY): Payer: Self-pay

## 2023-06-30 MED ORDER — MOUNJARO 5 MG/0.5ML ~~LOC~~ SOAJ
5.0000 mg | SUBCUTANEOUS | 0 refills | Status: DC
  Filled 2023-06-30: qty 2, 28d supply, fill #0

## 2023-06-30 MED ORDER — MOUNJARO 5 MG/0.5ML ~~LOC~~ SOAJ
5.0000 mg | SUBCUTANEOUS | 0 refills | Status: DC
  Filled 2023-06-30: qty 6, 84d supply, fill #0
  Filled 2023-08-02: qty 2, 28d supply, fill #0

## 2023-06-30 MED ORDER — ONDANSETRON 8 MG PO TBDP
8.0000 mg | ORAL_TABLET | Freq: Three times a day (TID) | ORAL | 0 refills | Status: AC | PRN
  Filled 2023-06-30: qty 60, 20d supply, fill #0

## 2023-07-02 ENCOUNTER — Other Ambulatory Visit (HOSPITAL_COMMUNITY): Payer: Self-pay

## 2023-07-03 ENCOUNTER — Other Ambulatory Visit (HOSPITAL_COMMUNITY): Payer: Self-pay

## 2023-07-09 NOTE — Progress Notes (Unsigned)
HPI M former smoker followed for OSA, complicated by   CAD, HTN, GERD, DM2, Hypothyroid, Depression, Hyperlipidemia, Glaucoma, Obesity, Syncopal Episodes, - Alprazolam 0.5, Prozac, Gabapentin, NPSG 10/03/08- AHI 4/ hr/ RDI 11/ hr, desaturation to 84% , body weight 257 lbs NPSG 10/06/13- AHI 15.9/ hr, desaturation to 80%, body weight 240 lbs HST 03/03/23- AHI 15.8/hr, desaturation to 77%, body weight 251 lbs  ======================================================== 04/08/23- 67 yoM former smoker followed for OSA, complicated by   CAD, HTN, GERD, DM2, Hypothyroid, Depression, Hyperlipidemia, Glaucoma, Obesity, Syncopal Episodes, - Alprazolam 0.5, Prozac, Gabapentin, He and his primary physician have discussed bariatric surgery.  I discussed the ability of CPAP to flex with weight loss.  07/12/23- 48 yoM former smoker followed for OSA, complicated by   CAD, HTN, GERD, DM2, Hypothyroid, Depression, Hyperlipidemia, Glaucoma, Obesity, Syncopal Episodes, - Alprazolam 0.5, Prozac, Gabapentin CPAP auto 5-20/ [employer]   new 04/08/23 Download compliance- Body weight today-    ROS-see HPI  + = positive Constitutional:    weight loss, night sweats, fevers, chills, fatigue, lassitude. HEENT:    headaches, difficulty swallowing, tooth/dental problems, sore throat,       sneezing, itching, ear ache, nasal congestion, post nasal drip, snoring CV:    chest pain, orthopnea, PND, swelling in lower extremities, anasarca,                dizziness, palpitations Resp:   shortness of breath with exertion or at rest.                productive cough,   non-productive cough, coughing up of blood.              change in color of mucus.  wheezing.   Skin:    rash or lesions. GI:  No-   heartburn, indigestion, abdominal pain, nausea, vomiting, diarrhea,                 change in bowel habits, loss of appetite GU: dysuria, change in color of urine, no urgency or frequency.   flank pain. MS:   joint pain, stiffness, decreased  range of motion, back pain. Neuro-     nothing unusual Psych:  change in mood or affect.  depression or anxiety.   memory loss.  OBJ- Physical Exam General- Alert, Oriented, Affect-appropriate, Distress- none acute, + obese Skin- rash-none, lesions- none, excoriation- none Lymphadenopathy- none Head- atraumatic            Eyes- Gross vision intact, PERRLA, conjunctivae and secretions clear            Ears- Hearing, canals-normal            Nose- Clear, no-Septal dev, mucus, polyps, erosion, perforation             Throat- Mallampati III-IV , mucosa clear , drainage- none, tonsils- atrophic, +teeth Neck- flexible , trachea midline, no stridor , thyroid nl, carotid no bruit Chest - symmetrical excursion , unlabored           Heart/CV- RRR , no murmur , no gallop  , no rub, nl s1 s2                           - JVD- none , edema- none, stasis changes- none, varices- none           Lung- clear to P&A, wheeze- none, cough- none , dullness-none, rub- none  Chest wall-  Abd-  Br/ Gen/ Rectal- Not done, not indicated Extrem- cyanosis- none, clubbing, none, atrophy- none, strength- nl Neuro- grossly intact to observation

## 2023-07-11 ENCOUNTER — Other Ambulatory Visit (HOSPITAL_COMMUNITY): Payer: Self-pay

## 2023-07-12 ENCOUNTER — Other Ambulatory Visit (HOSPITAL_COMMUNITY): Payer: Self-pay

## 2023-07-12 ENCOUNTER — Ambulatory Visit: Payer: Medicare Other | Admitting: Internal Medicine

## 2023-07-12 ENCOUNTER — Encounter: Payer: Self-pay | Admitting: Internal Medicine

## 2023-07-12 VITALS — BP 146/71 | HR 52 | Temp 98.0°F | Ht 67.5 in | Wt 252.8 lb

## 2023-07-12 DIAGNOSIS — Z23 Encounter for immunization: Secondary | ICD-10-CM

## 2023-07-12 DIAGNOSIS — G4733 Obstructive sleep apnea (adult) (pediatric): Secondary | ICD-10-CM

## 2023-07-12 MED ORDER — ROSUVASTATIN CALCIUM 5 MG PO TABS
5.0000 mg | ORAL_TABLET | Freq: Every day | ORAL | 3 refills | Status: DC
  Filled 2023-07-12: qty 30, 30d supply, fill #0
  Filled 2023-08-08: qty 30, 30d supply, fill #1
  Filled 2023-09-07: qty 30, 30d supply, fill #2
  Filled 2023-10-11: qty 30, 30d supply, fill #3

## 2023-07-12 NOTE — Patient Instructions (Signed)
Order- Flu vax senior  Order- DME [facility] Continue CPAP auto 5-20, mask of choice, humidifier, supplies, AirView/ card  Please call if we can help

## 2023-07-21 ENCOUNTER — Other Ambulatory Visit (HOSPITAL_COMMUNITY): Payer: Self-pay

## 2023-07-22 ENCOUNTER — Other Ambulatory Visit (HOSPITAL_COMMUNITY): Payer: Self-pay

## 2023-07-26 ENCOUNTER — Other Ambulatory Visit (HOSPITAL_COMMUNITY): Payer: Self-pay

## 2023-07-31 ENCOUNTER — Other Ambulatory Visit (HOSPITAL_COMMUNITY): Payer: Self-pay

## 2023-08-01 ENCOUNTER — Other Ambulatory Visit: Payer: Self-pay | Admitting: Orthopaedic Surgery

## 2023-08-02 ENCOUNTER — Other Ambulatory Visit (HOSPITAL_COMMUNITY): Payer: Self-pay

## 2023-08-02 ENCOUNTER — Other Ambulatory Visit: Payer: Self-pay

## 2023-08-02 ENCOUNTER — Other Ambulatory Visit: Payer: Self-pay | Admitting: Physician Assistant

## 2023-08-02 MED ORDER — JANUMET XR 50-1000 MG PO TB24
1.0000 | ORAL_TABLET | Freq: Every day | ORAL | 0 refills | Status: DC
  Filled 2023-08-02: qty 30, 30d supply, fill #0
  Filled 2023-08-31: qty 30, 30d supply, fill #1
  Filled 2023-10-03 – 2023-10-05 (×2): qty 30, 30d supply, fill #2

## 2023-08-02 MED ORDER — LEVOTHYROXINE SODIUM 50 MCG PO TABS
50.0000 ug | ORAL_TABLET | Freq: Every day | ORAL | 0 refills | Status: DC
  Filled 2023-08-02: qty 30, 30d supply, fill #0
  Filled 2023-08-31: qty 30, 30d supply, fill #1
  Filled 2023-10-03 – 2023-10-05 (×2): qty 30, 30d supply, fill #2

## 2023-08-02 MED ORDER — AMLODIPINE BESYLATE 5 MG PO TABS
5.0000 mg | ORAL_TABLET | Freq: Every day | ORAL | 0 refills | Status: DC
  Filled 2023-08-02: qty 30, 30d supply, fill #0
  Filled 2023-08-31: qty 30, 30d supply, fill #1
  Filled 2023-10-03 – 2023-10-05 (×2): qty 30, 30d supply, fill #2

## 2023-08-02 MED ORDER — GLIMEPIRIDE 4 MG PO TABS
4.0000 mg | ORAL_TABLET | Freq: Two times a day (BID) | ORAL | 0 refills | Status: DC
  Filled 2023-08-02: qty 60, 30d supply, fill #0
  Filled 2023-09-15: qty 60, 30d supply, fill #1
  Filled 2023-10-23: qty 60, 30d supply, fill #2

## 2023-08-02 MED ORDER — LANTUS SOLOSTAR 100 UNIT/ML ~~LOC~~ SOPN
70.0000 [IU] | PEN_INJECTOR | Freq: Every morning | SUBCUTANEOUS | 11 refills | Status: AC
  Filled 2023-08-02: qty 18, 25d supply, fill #0

## 2023-08-02 MED ORDER — PANTOPRAZOLE SODIUM 40 MG PO TBEC
40.0000 mg | DELAYED_RELEASE_TABLET | Freq: Every day | ORAL | 0 refills | Status: DC
  Filled 2023-08-02: qty 30, 30d supply, fill #0
  Filled 2023-08-31: qty 30, 30d supply, fill #1
  Filled 2023-10-03 – 2023-10-05 (×2): qty 30, 30d supply, fill #2

## 2023-08-02 MED ORDER — TRAMADOL HCL 50 MG PO TABS
50.0000 mg | ORAL_TABLET | Freq: Four times a day (QID) | ORAL | 0 refills | Status: DC | PRN
  Filled 2023-08-02: qty 28, 7d supply, fill #0
  Filled 2023-09-07: qty 28, 7d supply, fill #1

## 2023-08-02 MED ORDER — CARVEDILOL 25 MG PO TABS
25.0000 mg | ORAL_TABLET | Freq: Two times a day (BID) | ORAL | 0 refills | Status: DC
  Filled 2023-08-02: qty 60, 30d supply, fill #0
  Filled 2023-08-31: qty 60, 30d supply, fill #1
  Filled 2023-10-11: qty 60, 30d supply, fill #2

## 2023-08-02 MED ORDER — FENOFIBRATE 160 MG PO TABS: 160.0000 mg | ORAL_TABLET | Freq: Every day | ORAL | 0 refills | Status: AC

## 2023-08-03 ENCOUNTER — Other Ambulatory Visit (HOSPITAL_COMMUNITY): Payer: Self-pay

## 2023-08-05 ENCOUNTER — Other Ambulatory Visit (HOSPITAL_COMMUNITY): Payer: Self-pay

## 2023-08-05 MED ORDER — PROMETHAZINE-DM 6.25-15 MG/5ML PO SYRP
5.0000 mL | ORAL_SOLUTION | Freq: Four times a day (QID) | ORAL | 0 refills | Status: AC | PRN
  Filled 2023-08-05: qty 118, 6d supply, fill #0

## 2023-08-05 MED ORDER — ALBUTEROL SULFATE HFA 108 (90 BASE) MCG/ACT IN AERS
2.0000 | INHALATION_SPRAY | Freq: Four times a day (QID) | RESPIRATORY_TRACT | 0 refills | Status: AC | PRN
  Filled 2023-08-05: qty 6.7, 25d supply, fill #0

## 2023-08-05 MED ORDER — METHYLPREDNISOLONE 4 MG PO TBPK
ORAL_TABLET | ORAL | 0 refills | Status: AC
  Filled 2023-08-05: qty 21, 6d supply, fill #0

## 2023-08-05 MED ORDER — AZITHROMYCIN 250 MG PO TABS
ORAL_TABLET | ORAL | 0 refills | Status: AC
  Filled 2023-08-05: qty 6, 5d supply, fill #0

## 2023-08-09 ENCOUNTER — Other Ambulatory Visit (HOSPITAL_COMMUNITY): Payer: Self-pay

## 2023-08-23 NOTE — Assessment & Plan Note (Signed)
 Encourage continued effort- diet/ exercise

## 2023-08-23 NOTE — Assessment & Plan Note (Signed)
 Benefits from CPAP. Good start Plan- continue auto 5-20

## 2023-08-31 ENCOUNTER — Other Ambulatory Visit (HOSPITAL_COMMUNITY): Payer: Self-pay

## 2023-08-31 ENCOUNTER — Encounter: Payer: Self-pay | Admitting: Internal Medicine

## 2023-08-31 MED ORDER — MOUNJARO 7.5 MG/0.5ML ~~LOC~~ SOAJ
7.5000 mg | SUBCUTANEOUS | 0 refills | Status: DC
  Filled 2023-08-31: qty 2, 28d supply, fill #0

## 2023-08-31 MED ORDER — MOUNJARO 10 MG/0.5ML ~~LOC~~ SOAJ
10.0000 mg | SUBCUTANEOUS | 1 refills | Status: DC
  Filled 2023-10-05: qty 2, 28d supply, fill #0
  Filled 2023-11-01: qty 2, 28d supply, fill #1

## 2023-09-01 ENCOUNTER — Other Ambulatory Visit (HOSPITAL_COMMUNITY): Payer: Self-pay

## 2023-09-06 ENCOUNTER — Other Ambulatory Visit (HOSPITAL_COMMUNITY): Payer: Self-pay

## 2023-09-08 ENCOUNTER — Other Ambulatory Visit: Payer: Self-pay

## 2023-09-08 ENCOUNTER — Other Ambulatory Visit (HOSPITAL_COMMUNITY): Payer: Self-pay

## 2023-09-09 ENCOUNTER — Other Ambulatory Visit: Payer: Self-pay | Admitting: Physician Assistant

## 2023-09-09 ENCOUNTER — Other Ambulatory Visit (HOSPITAL_COMMUNITY): Payer: Self-pay

## 2023-09-10 ENCOUNTER — Other Ambulatory Visit (HOSPITAL_COMMUNITY): Payer: Self-pay

## 2023-09-10 MED ORDER — TRAMADOL HCL 50 MG PO TABS
50.0000 mg | ORAL_TABLET | Freq: Four times a day (QID) | ORAL | 0 refills | Status: DC | PRN
  Filled 2023-09-10: qty 30, 8d supply, fill #0

## 2023-09-11 ENCOUNTER — Other Ambulatory Visit (HOSPITAL_COMMUNITY): Payer: Self-pay

## 2023-09-16 ENCOUNTER — Other Ambulatory Visit (HOSPITAL_COMMUNITY): Payer: Self-pay

## 2023-09-22 ENCOUNTER — Other Ambulatory Visit (HOSPITAL_COMMUNITY): Payer: Self-pay

## 2023-09-23 ENCOUNTER — Other Ambulatory Visit: Payer: Self-pay

## 2023-09-23 ENCOUNTER — Other Ambulatory Visit (HOSPITAL_COMMUNITY): Payer: Self-pay

## 2023-09-23 MED ORDER — FENOFIBRATE 160 MG PO TABS
160.0000 mg | ORAL_TABLET | Freq: Every day | ORAL | 1 refills | Status: DC
Start: 2023-09-23 — End: 2024-03-27
  Filled 2023-09-23: qty 90, 90d supply, fill #0
  Filled 2023-09-23: qty 30, 30d supply, fill #0
  Filled 2023-10-23: qty 30, 30d supply, fill #1
  Filled 2023-11-20: qty 30, 30d supply, fill #2
  Filled 2024-01-05: qty 30, 30d supply, fill #3
  Filled 2024-01-31: qty 30, 30d supply, fill #4
  Filled 2024-03-01: qty 30, 30d supply, fill #5

## 2023-09-24 ENCOUNTER — Other Ambulatory Visit: Payer: Self-pay

## 2023-10-03 ENCOUNTER — Other Ambulatory Visit (HOSPITAL_COMMUNITY): Payer: Self-pay

## 2023-10-03 ENCOUNTER — Other Ambulatory Visit: Payer: Self-pay

## 2023-10-03 ENCOUNTER — Encounter: Payer: Self-pay | Admitting: Orthopaedic Surgery

## 2023-10-03 ENCOUNTER — Other Ambulatory Visit: Payer: Self-pay | Admitting: Physician Assistant

## 2023-10-04 ENCOUNTER — Other Ambulatory Visit: Payer: Self-pay | Admitting: Orthopaedic Surgery

## 2023-10-04 ENCOUNTER — Other Ambulatory Visit (HOSPITAL_COMMUNITY): Payer: Self-pay

## 2023-10-04 MED ORDER — LANTUS SOLOSTAR 100 UNIT/ML ~~LOC~~ SOPN
70.0000 [IU] | PEN_INJECTOR | Freq: Every morning | SUBCUTANEOUS | 11 refills | Status: AC
  Filled 2023-10-04: qty 18, 25d supply, fill #0
  Filled 2023-11-02: qty 18, 25d supply, fill #1

## 2023-10-04 MED ORDER — GABAPENTIN 100 MG PO CAPS
100.0000 mg | ORAL_CAPSULE | Freq: Three times a day (TID) | ORAL | 2 refills | Status: AC
  Filled 2023-10-04: qty 90, 30d supply, fill #0

## 2023-10-04 MED ORDER — TIZANIDINE HCL 4 MG PO TABS
4.0000 mg | ORAL_TABLET | Freq: Three times a day (TID) | ORAL | 0 refills | Status: AC | PRN
  Filled 2023-10-04: qty 30, 10d supply, fill #0

## 2023-10-04 MED FILL — Tramadol HCl Tab 50 MG: ORAL | 8 days supply | Qty: 30 | Fill #0 | Status: AC

## 2023-10-05 ENCOUNTER — Other Ambulatory Visit (HOSPITAL_COMMUNITY): Payer: Self-pay

## 2023-10-08 ENCOUNTER — Other Ambulatory Visit (HOSPITAL_COMMUNITY): Payer: Self-pay

## 2023-10-11 ENCOUNTER — Other Ambulatory Visit (HOSPITAL_COMMUNITY): Payer: Self-pay

## 2023-10-11 MED ORDER — LANTUS SOLOSTAR 100 UNIT/ML ~~LOC~~ SOPN
PEN_INJECTOR | Freq: Every morning | SUBCUTANEOUS | 11 refills | Status: AC
  Filled 2023-11-30: qty 21, 30d supply, fill #0
  Filled 2024-01-07: qty 21, 30d supply, fill #1
  Filled 2024-02-08: qty 21, 30d supply, fill #2
  Filled 2024-03-17: qty 21, 30d supply, fill #3
  Filled 2024-04-18: qty 21, 30d supply, fill #4
  Filled 2024-05-20: qty 21, 30d supply, fill #5
  Filled 2024-06-17: qty 21, 30d supply, fill #6
  Filled 2024-07-13: qty 21, 30d supply, fill #7
  Filled 2024-09-04: qty 21, 30d supply, fill #8

## 2023-10-16 ENCOUNTER — Other Ambulatory Visit (HOSPITAL_COMMUNITY): Payer: Self-pay

## 2023-10-19 ENCOUNTER — Other Ambulatory Visit (HOSPITAL_COMMUNITY): Payer: Self-pay

## 2023-10-23 ENCOUNTER — Other Ambulatory Visit (HOSPITAL_COMMUNITY): Payer: Self-pay

## 2023-10-25 ENCOUNTER — Other Ambulatory Visit (HOSPITAL_COMMUNITY): Payer: Self-pay

## 2023-10-25 MED ORDER — ONETOUCH VERIO VI STRP
ORAL_STRIP | 2 refills | Status: DC
  Filled 2023-10-25: qty 100, 30d supply, fill #0
  Filled 2023-11-24: qty 100, 30d supply, fill #1
  Filled 2023-12-25: qty 100, 30d supply, fill #2

## 2023-10-28 ENCOUNTER — Other Ambulatory Visit (HOSPITAL_COMMUNITY): Payer: Self-pay

## 2023-11-01 ENCOUNTER — Other Ambulatory Visit (HOSPITAL_COMMUNITY): Payer: Self-pay

## 2023-11-01 MED ORDER — AMLODIPINE BESYLATE 5 MG PO TABS
5.0000 mg | ORAL_TABLET | Freq: Every day | ORAL | 0 refills | Status: DC
  Filled 2023-11-01 – 2023-11-02 (×2): qty 90, 90d supply, fill #0

## 2023-11-01 MED ORDER — LEVOTHYROXINE SODIUM 50 MCG PO TABS
50.0000 ug | ORAL_TABLET | Freq: Every day | ORAL | 0 refills | Status: AC
  Filled 2023-11-01: qty 90, 90d supply, fill #0

## 2023-11-01 MED ORDER — JANUMET XR 50-1000 MG PO TB24
1.0000 | ORAL_TABLET | Freq: Every day | ORAL | 0 refills | Status: AC
  Filled 2023-11-01: qty 90, 90d supply, fill #0
  Filled 2023-11-01: qty 30, 30d supply, fill #0
  Filled 2023-11-01: qty 90, 90d supply, fill #0
  Filled 2023-11-29: qty 30, 30d supply, fill #1

## 2023-11-01 MED ORDER — HYDROCHLOROTHIAZIDE 25 MG PO TABS
12.5000 mg | ORAL_TABLET | Freq: Every day | ORAL | 3 refills | Status: DC
Start: 2023-11-01 — End: 2024-01-05
  Filled 2023-11-01: qty 45, 90d supply, fill #0
  Filled 2023-11-02: qty 15, 30d supply, fill #0
  Filled 2023-11-29: qty 15, 30d supply, fill #1

## 2023-11-01 MED ORDER — LEVOTHYROXINE SODIUM 50 MCG PO TABS
50.0000 ug | ORAL_TABLET | Freq: Every day | ORAL | 0 refills | Status: DC
Start: 2023-11-01 — End: 2024-02-01
  Filled 2023-11-01 (×2): qty 30, 30d supply, fill #0
  Filled 2023-11-29: qty 30, 30d supply, fill #1
  Filled 2024-01-05: qty 30, 30d supply, fill #2

## 2023-11-01 MED ORDER — JANUMET XR 50-1000 MG PO TB24
1.0000 | ORAL_TABLET | Freq: Every day | ORAL | 0 refills | Status: AC
  Filled 2023-11-01: qty 30, 30d supply, fill #0

## 2023-11-01 MED ORDER — PANTOPRAZOLE SODIUM 40 MG PO TBEC
40.0000 mg | DELAYED_RELEASE_TABLET | Freq: Every day | ORAL | 0 refills | Status: DC
  Filled 2023-11-01: qty 30, 30d supply, fill #0
  Filled 2023-11-01: qty 90, 90d supply, fill #0
  Filled 2023-11-29: qty 30, 30d supply, fill #1
  Filled 2024-01-05: qty 30, 30d supply, fill #2

## 2023-11-01 MED ORDER — PANTOPRAZOLE SODIUM 40 MG PO TBEC
40.0000 mg | DELAYED_RELEASE_TABLET | Freq: Every day | ORAL | 0 refills | Status: AC
  Filled 2023-11-01: qty 90, 90d supply, fill #0

## 2023-11-01 MED ORDER — AMLODIPINE BESYLATE 5 MG PO TABS
5.0000 mg | ORAL_TABLET | Freq: Every day | ORAL | 0 refills | Status: DC
  Filled 2023-11-01 – 2024-04-18 (×2): qty 30, 30d supply, fill #0
  Filled 2024-05-24: qty 30, 30d supply, fill #1
  Filled 2024-06-13 – 2024-06-16 (×2): qty 30, 30d supply, fill #2

## 2023-11-02 ENCOUNTER — Other Ambulatory Visit (HOSPITAL_BASED_OUTPATIENT_CLINIC_OR_DEPARTMENT_OTHER): Payer: Self-pay

## 2023-11-02 ENCOUNTER — Other Ambulatory Visit: Payer: Self-pay

## 2023-11-02 ENCOUNTER — Other Ambulatory Visit (HOSPITAL_COMMUNITY): Payer: Self-pay

## 2023-11-03 ENCOUNTER — Other Ambulatory Visit (HOSPITAL_COMMUNITY): Payer: Self-pay

## 2023-11-11 ENCOUNTER — Other Ambulatory Visit (HOSPITAL_COMMUNITY): Payer: Self-pay

## 2023-11-11 ENCOUNTER — Other Ambulatory Visit: Payer: Self-pay

## 2023-11-11 MED ORDER — CARVEDILOL 25 MG PO TABS
25.0000 mg | ORAL_TABLET | Freq: Two times a day (BID) | ORAL | 1 refills | Status: DC
  Filled 2023-11-11: qty 180, 90d supply, fill #0
  Filled 2024-02-08: qty 180, 90d supply, fill #1

## 2023-11-11 MED ORDER — ROSUVASTATIN CALCIUM 5 MG PO TABS
5.0000 mg | ORAL_TABLET | Freq: Every day | ORAL | 1 refills | Status: DC
  Filled 2023-11-11: qty 90, 90d supply, fill #0
  Filled 2024-02-08: qty 90, 90d supply, fill #1

## 2023-11-22 ENCOUNTER — Other Ambulatory Visit (HOSPITAL_COMMUNITY): Payer: Self-pay

## 2023-11-25 ENCOUNTER — Other Ambulatory Visit (HOSPITAL_COMMUNITY): Payer: Self-pay

## 2023-11-25 MED ORDER — GLIMEPIRIDE 4 MG PO TABS
4.0000 mg | ORAL_TABLET | Freq: Two times a day (BID) | ORAL | 1 refills | Status: AC
  Filled 2023-11-25: qty 60, 30d supply, fill #0
  Filled 2024-01-18: qty 60, 30d supply, fill #1
  Filled 2024-03-19: qty 60, 30d supply, fill #2
  Filled 2024-05-30: qty 60, 30d supply, fill #3
  Filled 2024-07-20: qty 60, 30d supply, fill #4
  Filled 2024-07-30 – 2024-08-04 (×2): qty 60, 30d supply, fill #5

## 2023-11-25 MED ORDER — LISINOPRIL 40 MG PO TABS
40.0000 mg | ORAL_TABLET | Freq: Every day | ORAL | 1 refills | Status: DC
  Filled 2023-11-25 – 2023-12-04 (×2): qty 30, 30d supply, fill #0
  Filled 2024-01-11: qty 30, 30d supply, fill #1
  Filled 2024-02-08: qty 30, 30d supply, fill #2
  Filled 2024-03-17: qty 30, 30d supply, fill #3
  Filled 2024-04-18: qty 30, 30d supply, fill #4
  Filled 2024-05-10: qty 30, 30d supply, fill #5

## 2023-11-30 ENCOUNTER — Other Ambulatory Visit (HOSPITAL_COMMUNITY): Payer: Self-pay

## 2023-11-30 ENCOUNTER — Other Ambulatory Visit: Payer: Self-pay

## 2023-11-30 MED ORDER — MOUNJARO 10 MG/0.5ML ~~LOC~~ SOAJ
10.0000 mg | SUBCUTANEOUS | 1 refills | Status: DC
  Filled 2023-11-30: qty 2, 28d supply, fill #0
  Filled 2023-12-26 – 2024-01-05 (×2): qty 2, 28d supply, fill #1

## 2023-12-02 ENCOUNTER — Other Ambulatory Visit (HOSPITAL_COMMUNITY): Payer: Self-pay

## 2023-12-04 ENCOUNTER — Other Ambulatory Visit (HOSPITAL_COMMUNITY): Payer: Self-pay

## 2023-12-06 ENCOUNTER — Other Ambulatory Visit (HOSPITAL_COMMUNITY): Payer: Self-pay

## 2024-01-04 ENCOUNTER — Other Ambulatory Visit (HOSPITAL_COMMUNITY): Payer: Self-pay

## 2024-01-05 ENCOUNTER — Other Ambulatory Visit (HOSPITAL_COMMUNITY): Payer: Self-pay

## 2024-01-05 MED ORDER — ADULT BLOOD PRESSURE CUFF LG KIT
PACK | 0 refills | Status: AC
  Filled 2024-01-05: qty 1, 30d supply, fill #0

## 2024-01-06 ENCOUNTER — Other Ambulatory Visit: Payer: Self-pay

## 2024-01-06 ENCOUNTER — Other Ambulatory Visit (HOSPITAL_COMMUNITY): Payer: Self-pay

## 2024-01-07 ENCOUNTER — Other Ambulatory Visit (HOSPITAL_COMMUNITY): Payer: Self-pay

## 2024-01-07 ENCOUNTER — Other Ambulatory Visit: Payer: Self-pay

## 2024-01-07 MED ORDER — BLOOD PRESSURE MONITOR MISC
0 refills | Status: AC
  Filled 2024-01-07: qty 1, 28d supply, fill #0
  Filled 2024-01-07 – 2024-01-10 (×2): qty 1, 30d supply, fill #0

## 2024-01-10 ENCOUNTER — Other Ambulatory Visit (HOSPITAL_COMMUNITY): Payer: Self-pay

## 2024-01-11 ENCOUNTER — Other Ambulatory Visit (HOSPITAL_COMMUNITY): Payer: Self-pay

## 2024-01-12 ENCOUNTER — Other Ambulatory Visit: Payer: Self-pay | Admitting: Physician Assistant

## 2024-01-12 ENCOUNTER — Other Ambulatory Visit: Payer: Self-pay

## 2024-01-13 ENCOUNTER — Other Ambulatory Visit (HOSPITAL_COMMUNITY): Payer: Self-pay

## 2024-01-13 MED FILL — Tramadol HCl Tab 50 MG: ORAL | 8 days supply | Qty: 30 | Fill #0 | Status: AC

## 2024-01-18 ENCOUNTER — Other Ambulatory Visit (HOSPITAL_COMMUNITY): Payer: Self-pay

## 2024-01-19 ENCOUNTER — Other Ambulatory Visit (HOSPITAL_COMMUNITY): Payer: Self-pay

## 2024-01-19 ENCOUNTER — Other Ambulatory Visit (HOSPITAL_BASED_OUTPATIENT_CLINIC_OR_DEPARTMENT_OTHER): Payer: Self-pay

## 2024-01-19 MED ORDER — AMLODIPINE BESYLATE 5 MG PO TABS
5.0000 mg | ORAL_TABLET | Freq: Every day | ORAL | 0 refills | Status: DC
  Filled 2024-01-19: qty 90, 90d supply, fill #0

## 2024-01-27 ENCOUNTER — Other Ambulatory Visit (HOSPITAL_COMMUNITY): Payer: Self-pay

## 2024-01-28 ENCOUNTER — Other Ambulatory Visit (HOSPITAL_COMMUNITY): Payer: Self-pay

## 2024-01-28 MED ORDER — ONETOUCH VERIO VI STRP
ORAL_STRIP | 2 refills | Status: AC
  Filled 2024-01-28: qty 100, 33d supply, fill #0

## 2024-01-31 ENCOUNTER — Other Ambulatory Visit (HOSPITAL_COMMUNITY): Payer: Self-pay

## 2024-02-01 ENCOUNTER — Other Ambulatory Visit (HOSPITAL_COMMUNITY): Payer: Self-pay

## 2024-02-01 MED ORDER — PANTOPRAZOLE SODIUM 40 MG PO TBEC
40.0000 mg | DELAYED_RELEASE_TABLET | Freq: Every day | ORAL | 0 refills | Status: DC
  Filled 2024-02-01: qty 90, 90d supply, fill #0

## 2024-02-01 MED ORDER — MOUNJARO 10 MG/0.5ML ~~LOC~~ SOAJ
10.0000 mg | SUBCUTANEOUS | 1 refills | Status: DC
  Filled 2024-02-01: qty 2, 28d supply, fill #0
  Filled 2024-02-27 – 2024-02-28 (×2): qty 2, 28d supply, fill #1

## 2024-02-01 MED ORDER — LEVOTHYROXINE SODIUM 50 MCG PO TABS
50.0000 ug | ORAL_TABLET | Freq: Every day | ORAL | 0 refills | Status: DC
  Filled 2024-02-01: qty 90, 90d supply, fill #0

## 2024-02-02 ENCOUNTER — Other Ambulatory Visit (HOSPITAL_COMMUNITY): Payer: Self-pay

## 2024-02-02 ENCOUNTER — Other Ambulatory Visit: Payer: Self-pay

## 2024-02-02 MED ORDER — FREESTYLE LIBRE 3 PLUS SENSOR MISC
1 refills | Status: DC
  Filled 2024-02-02: qty 6, 90d supply, fill #0
  Filled 2024-04-08 – 2024-04-24 (×2): qty 6, 90d supply, fill #1

## 2024-02-03 ENCOUNTER — Other Ambulatory Visit (HOSPITAL_COMMUNITY): Payer: Self-pay

## 2024-02-08 ENCOUNTER — Other Ambulatory Visit (HOSPITAL_COMMUNITY): Payer: Self-pay

## 2024-02-08 MED ORDER — FLUOXETINE HCL 40 MG PO CAPS
40.0000 mg | ORAL_CAPSULE | Freq: Every day | ORAL | 3 refills | Status: AC
  Filled 2024-02-08: qty 90, 90d supply, fill #0
  Filled 2024-05-09: qty 90, 90d supply, fill #1
  Filled 2024-08-04: qty 90, 90d supply, fill #2

## 2024-02-09 ENCOUNTER — Other Ambulatory Visit (HOSPITAL_COMMUNITY): Payer: Self-pay

## 2024-02-28 ENCOUNTER — Other Ambulatory Visit (HOSPITAL_COMMUNITY): Payer: Self-pay

## 2024-02-29 ENCOUNTER — Other Ambulatory Visit (HOSPITAL_COMMUNITY): Payer: Self-pay

## 2024-02-29 MED ORDER — MOUNJARO 10 MG/0.5ML ~~LOC~~ SOAJ
10.0000 mg | SUBCUTANEOUS | 1 refills | Status: DC
  Filled 2024-02-29: qty 2, 28d supply, fill #0

## 2024-03-01 ENCOUNTER — Other Ambulatory Visit (HOSPITAL_COMMUNITY): Payer: Self-pay

## 2024-03-02 ENCOUNTER — Other Ambulatory Visit (HOSPITAL_COMMUNITY): Payer: Self-pay

## 2024-03-16 ENCOUNTER — Other Ambulatory Visit (HOSPITAL_COMMUNITY): Payer: Self-pay

## 2024-03-16 MED ORDER — MOUNJARO 12.5 MG/0.5ML ~~LOC~~ SOAJ
12.5000 mg | SUBCUTANEOUS | 1 refills | Status: DC
  Filled 2024-03-16: qty 2, 28d supply, fill #0
  Filled 2024-04-23 – 2024-04-24 (×2): qty 2, 28d supply, fill #1

## 2024-03-17 ENCOUNTER — Other Ambulatory Visit (HOSPITAL_COMMUNITY): Payer: Self-pay

## 2024-03-27 ENCOUNTER — Other Ambulatory Visit (HOSPITAL_COMMUNITY): Payer: Self-pay

## 2024-03-27 MED ORDER — FENOFIBRATE 160 MG PO TABS
160.0000 mg | ORAL_TABLET | Freq: Every day | ORAL | 1 refills | Status: AC
  Filled 2024-03-27: qty 30, 30d supply, fill #0
  Filled 2024-04-27: qty 30, 30d supply, fill #1
  Filled 2024-05-30: qty 30, 30d supply, fill #2
  Filled 2024-07-02: qty 30, 30d supply, fill #3
  Filled 2024-07-30: qty 30, 30d supply, fill #4
  Filled 2024-08-04 – 2024-08-29 (×2): qty 30, 30d supply, fill #5

## 2024-04-08 ENCOUNTER — Other Ambulatory Visit (HOSPITAL_COMMUNITY): Payer: Self-pay

## 2024-04-18 ENCOUNTER — Other Ambulatory Visit: Payer: Self-pay

## 2024-04-18 ENCOUNTER — Other Ambulatory Visit (HOSPITAL_COMMUNITY): Payer: Self-pay

## 2024-04-18 ENCOUNTER — Encounter (HOSPITAL_COMMUNITY): Payer: Self-pay

## 2024-04-18 MED ORDER — AMLODIPINE BESYLATE 5 MG PO TABS
5.0000 mg | ORAL_TABLET | Freq: Every day | ORAL | 0 refills | Status: AC
  Filled 2024-04-18: qty 90, 90d supply, fill #0
  Filled 2024-04-18: qty 30, 30d supply, fill #0

## 2024-04-19 ENCOUNTER — Other Ambulatory Visit (HOSPITAL_COMMUNITY): Payer: Self-pay

## 2024-04-19 ENCOUNTER — Other Ambulatory Visit: Payer: Self-pay

## 2024-04-24 ENCOUNTER — Other Ambulatory Visit (HOSPITAL_COMMUNITY): Payer: Self-pay

## 2024-04-24 ENCOUNTER — Other Ambulatory Visit: Payer: Self-pay

## 2024-04-24 MED ORDER — FREESTYLE LIBRE 3 PLUS SENSOR MISC
3 refills | Status: AC
  Filled 2024-04-24 – 2024-07-20 (×2): qty 6, 90d supply, fill #0

## 2024-04-26 ENCOUNTER — Other Ambulatory Visit (HOSPITAL_COMMUNITY): Payer: Self-pay

## 2024-04-26 MED ORDER — MOUNJARO 15 MG/0.5ML ~~LOC~~ SOAJ
15.0000 mg | SUBCUTANEOUS | 1 refills | Status: AC
  Filled 2024-04-26 – 2024-05-20 (×2): qty 6, 84d supply, fill #0
  Filled 2024-08-13: qty 6, 84d supply, fill #1
  Filled 2024-08-15: qty 2, 28d supply, fill #1

## 2024-04-27 ENCOUNTER — Other Ambulatory Visit (HOSPITAL_COMMUNITY): Payer: Self-pay

## 2024-04-28 ENCOUNTER — Other Ambulatory Visit: Payer: Self-pay

## 2024-04-28 ENCOUNTER — Other Ambulatory Visit (HOSPITAL_COMMUNITY): Payer: Self-pay

## 2024-04-28 MED ORDER — LEVOTHYROXINE SODIUM 50 MCG PO TABS
50.0000 ug | ORAL_TABLET | Freq: Every day | ORAL | 3 refills | Status: AC
  Filled 2024-04-28: qty 90, 90d supply, fill #0

## 2024-04-28 MED ORDER — PANTOPRAZOLE SODIUM 40 MG PO TBEC
40.0000 mg | DELAYED_RELEASE_TABLET | Freq: Every day | ORAL | 0 refills | Status: DC
  Filled 2024-04-28: qty 90, 90d supply, fill #0

## 2024-04-28 MED ORDER — PANTOPRAZOLE SODIUM 40 MG PO TBEC
40.0000 mg | DELAYED_RELEASE_TABLET | Freq: Every day | ORAL | 3 refills | Status: AC
  Filled 2024-04-28: qty 90, 90d supply, fill #0

## 2024-04-28 MED ORDER — LEVOTHYROXINE SODIUM 50 MCG PO TABS
50.0000 ug | ORAL_TABLET | Freq: Every day | ORAL | 0 refills | Status: DC
  Filled 2024-04-28: qty 90, 90d supply, fill #0

## 2024-05-09 ENCOUNTER — Other Ambulatory Visit (HOSPITAL_COMMUNITY): Payer: Self-pay

## 2024-05-09 MED ORDER — CARVEDILOL 25 MG PO TABS
25.0000 mg | ORAL_TABLET | Freq: Two times a day (BID) | ORAL | 1 refills | Status: AC
  Filled 2024-05-09: qty 60, 30d supply, fill #0
  Filled 2024-06-13: qty 60, 30d supply, fill #1
  Filled 2024-07-04 – 2024-07-07 (×2): qty 60, 30d supply, fill #2
  Filled 2024-08-04: qty 60, 30d supply, fill #3
  Filled 2024-09-04: qty 60, 30d supply, fill #4

## 2024-05-10 ENCOUNTER — Other Ambulatory Visit (HOSPITAL_COMMUNITY): Payer: Self-pay

## 2024-05-10 MED ORDER — ROSUVASTATIN CALCIUM 5 MG PO TABS
5.0000 mg | ORAL_TABLET | Freq: Every day | ORAL | 1 refills | Status: AC
  Filled 2024-05-10: qty 90, 90d supply, fill #0

## 2024-05-11 ENCOUNTER — Other Ambulatory Visit (HOSPITAL_COMMUNITY): Payer: Self-pay

## 2024-05-20 ENCOUNTER — Other Ambulatory Visit (HOSPITAL_COMMUNITY): Payer: Self-pay

## 2024-05-22 ENCOUNTER — Other Ambulatory Visit: Payer: Self-pay

## 2024-05-31 ENCOUNTER — Other Ambulatory Visit (HOSPITAL_COMMUNITY): Payer: Self-pay

## 2024-06-03 ENCOUNTER — Other Ambulatory Visit (HOSPITAL_COMMUNITY): Payer: Self-pay

## 2024-06-12 ENCOUNTER — Encounter: Payer: Self-pay | Admitting: Radiology

## 2024-06-12 ENCOUNTER — Other Ambulatory Visit (HOSPITAL_COMMUNITY): Payer: Self-pay

## 2024-06-12 MED ORDER — LISINOPRIL 40 MG PO TABS
40.0000 mg | ORAL_TABLET | Freq: Every day | ORAL | 3 refills | Status: AC
  Filled 2024-06-12: qty 20, 20d supply, fill #0
  Filled 2024-06-12: qty 70, 70d supply, fill #0

## 2024-06-12 MED ORDER — PREDNISOLONE ACETATE 1 % OP SUSP
1.0000 [drp] | Freq: Two times a day (BID) | OPHTHALMIC | 0 refills | Status: AC
  Filled 2024-06-12: qty 5, 30d supply, fill #0

## 2024-06-13 ENCOUNTER — Other Ambulatory Visit (HOSPITAL_COMMUNITY): Payer: Self-pay

## 2024-06-17 ENCOUNTER — Other Ambulatory Visit (HOSPITAL_COMMUNITY): Payer: Self-pay

## 2024-07-05 ENCOUNTER — Other Ambulatory Visit (HOSPITAL_COMMUNITY): Payer: Self-pay

## 2024-07-05 ENCOUNTER — Encounter (HOSPITAL_COMMUNITY): Payer: Self-pay | Admitting: Pharmacist

## 2024-07-13 ENCOUNTER — Other Ambulatory Visit (HOSPITAL_COMMUNITY): Payer: Self-pay

## 2024-07-14 ENCOUNTER — Other Ambulatory Visit: Payer: Self-pay

## 2024-07-14 ENCOUNTER — Other Ambulatory Visit (HOSPITAL_COMMUNITY): Payer: Self-pay

## 2024-07-14 MED ORDER — AMLODIPINE BESYLATE 5 MG PO TABS
5.0000 mg | ORAL_TABLET | Freq: Every day | ORAL | 0 refills | Status: AC
  Filled 2024-07-14: qty 90, 90d supply, fill #0

## 2024-07-20 ENCOUNTER — Other Ambulatory Visit (HOSPITAL_COMMUNITY): Payer: Self-pay

## 2024-07-20 ENCOUNTER — Other Ambulatory Visit: Payer: Self-pay

## 2024-07-22 ENCOUNTER — Other Ambulatory Visit (HOSPITAL_COMMUNITY): Payer: Self-pay

## 2024-07-30 ENCOUNTER — Other Ambulatory Visit (HOSPITAL_COMMUNITY): Payer: Self-pay

## 2024-07-31 ENCOUNTER — Other Ambulatory Visit (HOSPITAL_COMMUNITY): Payer: Self-pay

## 2024-07-31 ENCOUNTER — Other Ambulatory Visit: Payer: Self-pay

## 2024-08-04 ENCOUNTER — Other Ambulatory Visit (HOSPITAL_COMMUNITY): Payer: Self-pay

## 2024-08-05 ENCOUNTER — Other Ambulatory Visit (HOSPITAL_COMMUNITY): Payer: Self-pay

## 2024-08-05 ENCOUNTER — Other Ambulatory Visit: Payer: Self-pay

## 2024-08-05 MED ORDER — FENOFIBRATE 160 MG PO TABS: 160.0000 mg | ORAL_TABLET | Freq: Every day | ORAL | 3 refills | Status: AC

## 2024-08-05 MED ORDER — PANTOPRAZOLE SODIUM 40 MG PO TBEC
40.0000 mg | DELAYED_RELEASE_TABLET | Freq: Every day | ORAL | 3 refills | Status: AC
  Filled 2024-08-05: qty 90, 90d supply, fill #0

## 2024-08-06 ENCOUNTER — Other Ambulatory Visit (HOSPITAL_COMMUNITY): Payer: Self-pay

## 2024-08-06 MED ORDER — LEVOTHYROXINE SODIUM 50 MCG PO TABS
50.0000 ug | ORAL_TABLET | Freq: Every day | ORAL | 3 refills | Status: AC
  Filled 2024-08-06: qty 90, 90d supply, fill #0

## 2024-08-07 ENCOUNTER — Other Ambulatory Visit (HOSPITAL_COMMUNITY): Payer: Self-pay

## 2024-08-07 ENCOUNTER — Other Ambulatory Visit: Payer: Self-pay

## 2024-08-07 MED ORDER — FLUOXETINE HCL 40 MG PO CAPS
40.0000 mg | ORAL_CAPSULE | Freq: Every day | ORAL | 3 refills | Status: AC
  Filled 2024-08-07: qty 90, 90d supply, fill #0

## 2024-08-09 ENCOUNTER — Other Ambulatory Visit (HOSPITAL_COMMUNITY): Payer: Self-pay

## 2024-08-11 ENCOUNTER — Other Ambulatory Visit (HOSPITAL_COMMUNITY): Payer: Self-pay

## 2024-08-11 MED ORDER — LEVOTHYROXINE SODIUM 50 MCG PO TABS
50.0000 ug | ORAL_TABLET | Freq: Every day | ORAL | 3 refills | Status: AC
  Filled 2024-08-11: qty 90, 90d supply, fill #0

## 2024-08-11 MED ORDER — ROSUVASTATIN CALCIUM 5 MG PO TABS
ORAL_TABLET | ORAL | 3 refills | Status: AC
  Filled 2024-08-11: qty 90, 90d supply, fill #0

## 2024-08-12 ENCOUNTER — Other Ambulatory Visit (HOSPITAL_COMMUNITY): Payer: Self-pay

## 2024-08-14 ENCOUNTER — Other Ambulatory Visit: Payer: Self-pay

## 2024-08-15 ENCOUNTER — Other Ambulatory Visit (HOSPITAL_COMMUNITY): Payer: Self-pay

## 2024-08-15 MED ORDER — AMOXICILLIN-POT CLAVULANATE 875-125 MG PO TABS
1.0000 | ORAL_TABLET | Freq: Two times a day (BID) | ORAL | 0 refills | Status: AC
  Filled 2024-08-15: qty 20, 10d supply, fill #0

## 2024-08-23 ENCOUNTER — Other Ambulatory Visit (HOSPITAL_COMMUNITY): Payer: Self-pay

## 2024-08-23 MED ORDER — ONDANSETRON 8 MG PO TBDP
8.0000 mg | ORAL_TABLET | Freq: Three times a day (TID) | ORAL | 0 refills | Status: AC | PRN
  Filled 2024-08-23: qty 20, 7d supply, fill #0

## 2024-09-05 ENCOUNTER — Other Ambulatory Visit (HOSPITAL_COMMUNITY): Payer: Self-pay
